# Patient Record
Sex: Male | Born: 1945 | Race: White | Hispanic: No | Marital: Married | Smoking: Former smoker
Health system: Southern US, Community
[De-identification: ages and names within clinical notes are randomized; demographics above are authoritative.]

## PROBLEM LIST (undated history)

## (undated) DIAGNOSIS — K5790 Diverticulosis of intestine, part unspecified, without perforation or abscess without bleeding: Secondary | ICD-10-CM

## (undated) DIAGNOSIS — K625 Hemorrhage of anus and rectum: Secondary | ICD-10-CM

## (undated) DIAGNOSIS — H269 Unspecified cataract: Secondary | ICD-10-CM

## (undated) DIAGNOSIS — I10 Essential (primary) hypertension: Secondary | ICD-10-CM

## (undated) DIAGNOSIS — K219 Gastro-esophageal reflux disease without esophagitis: Secondary | ICD-10-CM

## (undated) DIAGNOSIS — T7840XA Allergy, unspecified, initial encounter: Secondary | ICD-10-CM

## (undated) DIAGNOSIS — K635 Polyp of colon: Secondary | ICD-10-CM

## (undated) DIAGNOSIS — E119 Type 2 diabetes mellitus without complications: Secondary | ICD-10-CM

## (undated) DIAGNOSIS — I219 Acute myocardial infarction, unspecified: Secondary | ICD-10-CM

## (undated) DIAGNOSIS — M47816 Spondylosis without myelopathy or radiculopathy, lumbar region: Secondary | ICD-10-CM

## (undated) DIAGNOSIS — E039 Hypothyroidism, unspecified: Secondary | ICD-10-CM

## (undated) DIAGNOSIS — K55059 Acute (reversible) ischemia of intestine, part and extent unspecified: Secondary | ICD-10-CM

## (undated) DIAGNOSIS — M419 Scoliosis, unspecified: Secondary | ICD-10-CM

## (undated) HISTORY — DX: Scoliosis, unspecified: M41.9

## (undated) HISTORY — DX: Acute (reversible) ischemia of intestine, part and extent unspecified: K55.059

## (undated) HISTORY — DX: Type 2 diabetes mellitus without complications: E11.9

## (undated) HISTORY — DX: Gastro-esophageal reflux disease without esophagitis: K21.9

## (undated) HISTORY — DX: Hemorrhage of anus and rectum: K62.5

## (undated) HISTORY — PX: COLONOSCOPY: SHX174

## (undated) HISTORY — DX: Unspecified cataract: H26.9

## (undated) HISTORY — DX: Allergy, unspecified, initial encounter: T78.40XA

## (undated) HISTORY — DX: Hypothyroidism, unspecified: E03.9

## (undated) HISTORY — PX: OTHER SURGICAL HISTORY: SHX169

## (undated) HISTORY — DX: Diverticulosis of intestine, part unspecified, without perforation or abscess without bleeding: K57.90

## (undated) HISTORY — PX: CORONARY ANGIOPLASTY WITH STENT PLACEMENT: SHX49

## (undated) HISTORY — DX: Spondylosis without myelopathy or radiculopathy, lumbar region: M47.816

## (undated) HISTORY — PX: CATARACT EXTRACTION: SUR2

## (undated) HISTORY — DX: Polyp of colon: K63.5

## (undated) HISTORY — PX: POLYPECTOMY: SHX149

## (undated) HISTORY — DX: Essential (primary) hypertension: I10

## (undated) HISTORY — DX: Acute myocardial infarction, unspecified: I21.9

---
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
## 1992-09-08 HISTORY — PX: INGUINAL HERNIA REPAIR: SUR1180

## 1993-09-26 ENCOUNTER — Encounter (INDEPENDENT_AMBULATORY_CARE_PROVIDER_SITE_OTHER): Payer: Self-pay | Admitting: Gastroenterology

## 1998-03-27 ENCOUNTER — Other Ambulatory Visit: Admission: RE | Admit: 1998-03-27 | Discharge: 1998-03-27 | Payer: Self-pay | Admitting: Family Medicine

## 2002-09-08 DIAGNOSIS — I219 Acute myocardial infarction, unspecified: Secondary | ICD-10-CM

## 2002-09-08 HISTORY — DX: Acute myocardial infarction, unspecified: I21.9

## 2002-09-08 HISTORY — PX: CORONARY ARTERY BYPASS GRAFT: SHX141

## 2003-04-26 ENCOUNTER — Encounter: Payer: Self-pay | Admitting: Cardiology

## 2003-04-26 ENCOUNTER — Inpatient Hospital Stay (HOSPITAL_COMMUNITY): Admission: AD | Admit: 2003-04-26 | Discharge: 2003-04-29 | Payer: Self-pay | Admitting: Emergency Medicine

## 2003-04-26 ENCOUNTER — Encounter: Payer: Self-pay | Admitting: Emergency Medicine

## 2003-06-08 ENCOUNTER — Encounter: Payer: Self-pay | Admitting: Cardiothoracic Surgery

## 2003-06-12 ENCOUNTER — Encounter: Payer: Self-pay | Admitting: Cardiothoracic Surgery

## 2003-06-12 ENCOUNTER — Inpatient Hospital Stay (HOSPITAL_COMMUNITY): Admission: RE | Admit: 2003-06-12 | Discharge: 2003-06-17 | Payer: Self-pay | Admitting: Cardiothoracic Surgery

## 2003-06-13 ENCOUNTER — Encounter: Payer: Self-pay | Admitting: Cardiothoracic Surgery

## 2003-06-14 ENCOUNTER — Encounter: Payer: Self-pay | Admitting: Cardiothoracic Surgery

## 2003-06-15 ENCOUNTER — Encounter: Payer: Self-pay | Admitting: Cardiothoracic Surgery

## 2004-04-30 ENCOUNTER — Encounter (INDEPENDENT_AMBULATORY_CARE_PROVIDER_SITE_OTHER): Payer: Self-pay | Admitting: *Deleted

## 2005-08-18 ENCOUNTER — Encounter: Admission: RE | Admit: 2005-08-18 | Discharge: 2005-08-18 | Payer: Self-pay | Admitting: Cardiology

## 2005-11-19 ENCOUNTER — Ambulatory Visit: Payer: Self-pay | Admitting: Gastroenterology

## 2007-04-20 ENCOUNTER — Ambulatory Visit: Payer: Self-pay | Admitting: Gastroenterology

## 2007-12-07 ENCOUNTER — Ambulatory Visit: Payer: Self-pay | Admitting: Gastroenterology

## 2007-12-13 ENCOUNTER — Ambulatory Visit: Payer: Self-pay | Admitting: Gastroenterology

## 2007-12-22 ENCOUNTER — Ambulatory Visit: Payer: Self-pay | Admitting: Gastroenterology

## 2007-12-23 DIAGNOSIS — E039 Hypothyroidism, unspecified: Secondary | ICD-10-CM | POA: Insufficient documentation

## 2007-12-23 DIAGNOSIS — K649 Unspecified hemorrhoids: Secondary | ICD-10-CM | POA: Insufficient documentation

## 2007-12-23 DIAGNOSIS — I10 Essential (primary) hypertension: Secondary | ICD-10-CM | POA: Insufficient documentation

## 2007-12-23 DIAGNOSIS — Z87898 Personal history of other specified conditions: Secondary | ICD-10-CM | POA: Insufficient documentation

## 2007-12-23 DIAGNOSIS — I219 Acute myocardial infarction, unspecified: Secondary | ICD-10-CM | POA: Insufficient documentation

## 2007-12-23 DIAGNOSIS — K5730 Diverticulosis of large intestine without perforation or abscess without bleeding: Secondary | ICD-10-CM | POA: Insufficient documentation

## 2007-12-23 DIAGNOSIS — K219 Gastro-esophageal reflux disease without esophagitis: Secondary | ICD-10-CM | POA: Insufficient documentation

## 2007-12-23 DIAGNOSIS — D126 Benign neoplasm of colon, unspecified: Secondary | ICD-10-CM | POA: Insufficient documentation

## 2007-12-23 DIAGNOSIS — K625 Hemorrhage of anus and rectum: Secondary | ICD-10-CM | POA: Insufficient documentation

## 2008-01-10 ENCOUNTER — Telehealth (INDEPENDENT_AMBULATORY_CARE_PROVIDER_SITE_OTHER): Payer: Self-pay | Admitting: *Deleted

## 2008-07-12 ENCOUNTER — Encounter: Admission: RE | Admit: 2008-07-12 | Discharge: 2008-07-12 | Payer: Self-pay | Admitting: Neurosurgery

## 2009-01-02 ENCOUNTER — Emergency Department (HOSPITAL_COMMUNITY): Admission: EM | Admit: 2009-01-02 | Discharge: 2009-01-02 | Payer: Self-pay | Admitting: Emergency Medicine

## 2009-10-09 HISTORY — PX: OTHER SURGICAL HISTORY: SHX169

## 2010-07-17 ENCOUNTER — Ambulatory Visit (HOSPITAL_COMMUNITY): Admission: RE | Admit: 2010-07-17 | Discharge: 2010-07-17 | Payer: Self-pay | Admitting: Neurosurgery

## 2010-08-23 ENCOUNTER — Ambulatory Visit: Payer: Self-pay | Admitting: Cardiology

## 2010-11-04 ENCOUNTER — Telehealth: Payer: Self-pay | Admitting: Gastroenterology

## 2010-11-14 NOTE — Progress Notes (Signed)
Summary: ? re meds   Phone Note Call from Patient Call back at Home Phone [phone]   Caller: Patient Call For: Dr Jarold Motto Reason for Call: Talk to Nurse Summary of Call: Patient has questons regarding his nexium. Initial call taken by: Tawni Levy,  November 04, 2010 12:21 PM  Follow-up for Phone Call        rx sent for one month, pt sent to [facility] to make an office visit.  Follow-up by: Harlow Mares CMA Duncan Dull),  November 04, 2010 1:45 PM    Prescriptions: NEXIUM 40 MG  CPDR (ESOMEPRAZOLE MAGNESIUM) 1 capsule twice a day 30 minutes before meals  #60 Capsule x 0   Entered by:   Harlow Mares CMA (AAMA)   Authorized by:   Mardella Layman MD [facility]   Signed by:   Harlow Mares CMA (AAMA) on 11/04/2010   Method used:   Electronically to        [facility] [phone]* (retail)       [address]       [place], [place]  [address]       Ph: [phone] or [phone]       Fax: [phone]   RxID:   [id number]

## 2010-11-15 ENCOUNTER — Ambulatory Visit (INDEPENDENT_AMBULATORY_CARE_PROVIDER_SITE_OTHER): Payer: Medicare Other | Admitting: Gastroenterology

## 2010-11-15 ENCOUNTER — Encounter: Payer: Self-pay | Admitting: Gastroenterology

## 2010-11-15 DIAGNOSIS — K219 Gastro-esophageal reflux disease without esophagitis: Secondary | ICD-10-CM

## 2010-11-19 LAB — GLUCOSE, CAPILLARY
Glucose-Capillary: 113 mg/dL — ABNORMAL HIGH (ref 70–99)
Glucose-Capillary: 125 mg/dL — ABNORMAL HIGH (ref 70–99)

## 2010-11-19 NOTE — Assessment & Plan Note (Signed)
Summary: Follow up for Med Refills    History of Present Illness Visit Type: Follow-up Visit Primary GI MD: Sheryn Bison MD FACP FAGA Primary Provider: Jonny Ruiz Redding,MD Chief Complaint: Medication refill on Nexium, No GI complaints, Back Pain History of Present Illness:    this patient is status post recent spine surgery is completely asymptomatic. denies acid reflux symptoms and takes Nexium 40 mg a day , twice a day. his diabetes is managed by Dr.Kumar  and he is not on insulin. his hypertension is managed by Dr. Roger Shelter. the patient is up-to-date on his colonoscopy exams. Last endoscopy was 10 years ago but did not show Barrett's mucosa.   GI Review of Systems      Denies abdominal pain, acid reflux, belching, bloating, chest pain, dysphagia with liquids, dysphagia with solids, heartburn, loss of appetite, nausea, vomiting, vomiting blood, weight loss, and  weight gain.        Denies anal fissure, black tarry stools, change in bowel habit, constipation, diarrhea, diverticulosis, fecal incontinence, heme positive stool, hemorrhoids, irritable bowel syndrome, jaundice, light color stool, liver problems, rectal bleeding, and  rectal pain.    Current Medications (verified): 1)  Nexium 40 Mg  Cpdr (Esomeprazole Magnesium) .Marland Kitchen.. 1 Capsule Twice A Day 30 Minutes Before Meals 2)  Vytorin 10-20 Mg Tabs (Ezetimibe-Simvastatin) .Marland Kitchen.. 1 By Mouth At Bedtime 3)  Synthroid 150 Mcg Tabs (Levothyroxine Sodium) .Marland Kitchen.. 1 By Mouth Once Daily 4)  Hydrochlorothiazide 25 Mg Tabs (Hydrochlorothiazide) .Marland Kitchen.. 1 By Mouth Once Daily 5)  Altace 10 Mg Caps (Ramipril) .Marland Kitchen.. 1 By Mouth Once Daily 6)  Fexofenadine Hcl 180 Mg Tabs (Fexofenadine Hcl) .Marland Kitchen.. 1 By Mouth Once Daily 7)  Folic Acid 800 Mcg Tabs (Folic Acid) .Marland Kitchen.. 1 By Mouth Once Daily 8)  Aspirin 325 Mg Tabs (Aspirin) .Marland Kitchen.. 1 By Mouth Once Daily 9)  Multivitamins  Tabs (Multiple Vitamin) .Marland Kitchen.. 1 By Mouth Once Daily 10)  Metformin Hcl 500 Mg Tabs  (Metformin Hcl) .... 3 By Mouth With Evening Meal  Allergies (verified): No Known Drug Allergies  Past History:  Past medical, surgical, family and social histories (including risk factors) reviewed for relevance to current acute and chronic problems.  Past Medical History: Current Problems:  INGUINAL HERNIA, HX OF (ICD-V13.8) AMI (ICD-410.90) HYPERTENSION (ICD-401.9) HYPOTHYROIDISM (ICD-244.9) COLONIC POLYPS (ICD-211.3) HEMORRHOIDS (ICD-455.6) DIVERTICULOSIS, COLON (ICD-562.10) GERD (ICD-530.81) RECTAL BLEEDING (ICD-569.3) Diabetes 2009 Heart attack 2004 Lumbar arthritis Lumbar buldging Lumbar Scoliosis  Past Surgical History: appendectomy 1965 CABG x 5 Cardiac Stent placement  Hernia repair-inguinal Heart By- pass lumbar laminotomy and Foraminotomy   Family History: Reviewed history and no changes required.  Social History: Reviewed history and no changes required.  Review of Systems       The patient complains of back pain.  The patient denies allergy/sinus, anemia, anxiety-new, arthritis/joint pain, blood in urine, breast changes/lumps, change in vision, confusion, cough, coughing up blood, depression-new, fainting, fatigue, fever, headaches-new, hearing problems, heart murmur, heart rhythm changes, itching, menstrual pain, muscle pains/cramps, night sweats, nosebleeds, pregnancy symptoms, shortness of breath, skin rash, sleeping problems, sore throat, swelling of feet/legs, swollen lymph glands, thirst - excessive , urination - excessive , urination changes/pain, urine leakage, vision changes, and voice change.    Vital Signs:  Patient profile:   65 year old male Height:      69 inches Weight:      175 pounds BMI:     25.94 BSA:     1.95 Pulse rate:   80 / minute  Pulse rhythm:   regular BP sitting:   110 / 76  (left arm)  Vitals Entered By: Merri Ray CMA Duncan Dull) (November 15, 2010 11:09 AM)  Physical Exam  General:  Well developed, well nourished, no  acute distress.healthy appearing.   Head:  Normocephalic and atraumatic. Eyes:  PERRLA, no icterus.exam deferred to patient's ophthalmologist.   Lungs:  Clear throughout to auscultation. Heart:  Regular rate and rhythm; no murmurs, rubs,  or bruits. Abdomen:  Soft, nontender and nondistended. No masses, hepatosplenomegaly or hernias noted. Normal bowel sounds. Psych:  Alert and cooperative. Normal mood and affect.   Impression & Recommendations:  Problem # 1:  GERD (ICD-530.81) Assessment Improved  continue anti-reflux regime and daily Nexium therapy.  he does not need repeat endoscopy at this time.  Problem # 2:  COLONIC POLYPS (ICD-211.3) Assessment: Unchanged  followup as per clinical protocol.  Patient Instructions: 1)  Copy sent to : John Redding,MD 2)  Your prescription(s) have been sent to you pharmacy.  3)  The medication list was reviewed and reconciled.  All changed / newly prescribed medications were explained.  A complete medication list was provided to the patient / caregiver. 4)  Avoid foods high in acid content ( tomatoes, citrus juices, spicy foods) . Avoid eating within 3 to 4 hours of lying down or before exercising. Do not over eat; try smaller more frequent meals. Elevate head of bed four inches when sleeping.  5)  GI Reflux and Hiatal Hernia brochure given.  Prescriptions: NEXIUM 40 MG  CPDR (ESOMEPRAZOLE MAGNESIUM) 1 capsule twice a day 30 minutes before meals  #180 x 3   Entered by:   Harlow Mares CMA (AAMA)   Authorized by:   Mardella Layman MD [facility]   Signed by:   Harlow Mares CMA (AAMA) on 11/15/2010   Method used:   Electronically to        [facility]* (retail)             ,          Ph: [phone]       Fax: [phone]   RxID:   [id number]

## 2010-12-18 LAB — DIFFERENTIAL
Basophils Absolute: 0.1 10*3/uL (ref 0.0–0.1)
Basophils Relative: 1 % (ref 0–1)
Eosinophils Absolute: 0.5 10*3/uL (ref 0.0–0.7)
Eosinophils Relative: 6 % — ABNORMAL HIGH (ref 0–5)
Lymphocytes Relative: 27 % (ref 12–46)
Lymphs Abs: 2.2 10*3/uL (ref 0.7–4.0)
Monocytes Absolute: 0.8 10*3/uL (ref 0.1–1.0)
Monocytes Relative: 10 % (ref 3–12)
Neutro Abs: 4.4 10*3/uL (ref 1.7–7.7)
Neutrophils Relative %: 56 % (ref 43–77)

## 2010-12-18 LAB — COMPREHENSIVE METABOLIC PANEL
ALT: 25 U/L (ref 0–53)
AST: 17 U/L (ref 0–37)
Albumin: 3.9 g/dL (ref 3.5–5.2)
Alkaline Phosphatase: 55 U/L (ref 39–117)
BUN: 20 mg/dL (ref 6–23)
CO2: 27 mEq/L (ref 19–32)
Calcium: 9.2 mg/dL (ref 8.4–10.5)
Chloride: 99 mEq/L (ref 96–112)
Creatinine, Ser: 1.06 mg/dL (ref 0.4–1.5)
GFR calc Af Amer: >60 mL/min (ref >60)
GFR calc non Af Amer: >60 mL/min (ref >60)
Glucose, Bld: 103 mg/dL — ABNORMAL HIGH (ref 70–99)
Potassium: 4.2 mEq/L (ref 3.5–5.1)
Sodium: 134 mEq/L — ABNORMAL LOW (ref 135–145)
Total Bilirubin: 0.8 mg/dL (ref 0.3–1.2)
Total Protein: 6.6 g/dL (ref 6.0–8.3)

## 2010-12-18 LAB — POCT CARDIAC MARKERS
CKMB, poc: <1 ng/mL — ABNORMAL LOW (ref 1.0–8.0)
Myoglobin, poc: 49.3 ng/mL (ref 12–200)
Troponin i, poc: <0.05 ng/mL (ref 0.00–0.09)

## 2010-12-18 LAB — CBC
HCT: 41.8 % (ref 39.0–52.0)
Hemoglobin: 14.4 g/dL (ref 13.0–17.0)
MCHC: 34.3 g/dL (ref 30.0–36.0)
MCV: 91.8 fL (ref 78.0–100.0)
Platelets: 245 10*3/uL (ref 150–400)
RBC: 4.56 MIL/uL (ref 4.22–5.81)
RDW: 12.8 % (ref 11.5–15.5)
WBC: 7.9 10*3/uL (ref 4.0–10.5)

## 2011-01-09 ENCOUNTER — Other Ambulatory Visit: Payer: Self-pay | Admitting: *Deleted

## 2011-01-09 DIAGNOSIS — E7800 Pure hypercholesterolemia, unspecified: Secondary | ICD-10-CM

## 2011-01-09 MED ORDER — EZETIMIBE-SIMVASTATIN 10-20 MG PO TABS: 1.0000 | ORAL_TABLET | Freq: Every day | ORAL | Status: DC

## 2011-01-09 NOTE — Telephone Encounter (Signed)
Refill to [facility] per fax

## 2011-01-10 ENCOUNTER — Other Ambulatory Visit (INDEPENDENT_AMBULATORY_CARE_PROVIDER_SITE_OTHER): Payer: Medicare Other | Admitting: *Deleted

## 2011-01-10 DIAGNOSIS — E119 Type 2 diabetes mellitus without complications: Secondary | ICD-10-CM

## 2011-01-10 DIAGNOSIS — E7800 Pure hypercholesterolemia, unspecified: Secondary | ICD-10-CM

## 2011-01-10 LAB — BASIC METABOLIC PANEL
BUN: 15 mg/dL (ref 6–23)
CO2: 29 mEq/L (ref 19–32)
Calcium: 9.2 mg/dL (ref 8.4–10.5)
Chloride: 96 mEq/L (ref 96–112)
Creatinine, Ser: 1.2 mg/dL (ref 0.4–1.5)
GFR: 65.82 mL/min (ref >60.00)
Glucose, Bld: 97 mg/dL (ref 70–99)
Potassium: 4.4 mEq/L (ref 3.5–5.1)
Sodium: 134 mEq/L — ABNORMAL LOW (ref 135–145)

## 2011-01-10 LAB — LIPID PANEL
Cholesterol: 114 mg/dL (ref 0–200)
HDL: 40.8 mg/dL (ref >39.00)
LDL Cholesterol: 55 mg/dL (ref 0–99)
Total CHOL/HDL Ratio: 3
Triglycerides: 93 mg/dL (ref 0.0–149.0)
VLDL: 18.6 mg/dL (ref 0.0–40.0)

## 2011-01-10 LAB — HEPATIC FUNCTION PANEL
ALT: 19 U/L (ref 0–53)
AST: 18 U/L (ref 0–37)
Albumin: 4.2 g/dL (ref 3.5–5.2)
Alkaline Phosphatase: 56 U/L (ref 39–117)
Bilirubin, Direct: 0 mg/dL (ref 0.0–0.3)
Total Bilirubin: 0.3 mg/dL (ref 0.3–1.2)
Total Protein: 6.7 g/dL (ref 6.0–8.3)

## 2011-01-10 LAB — HEMOGLOBIN A1C: Hgb A1c MFr Bld: 6 % (ref 4.6–6.5)

## 2011-01-14 ENCOUNTER — Telehealth: Payer: Self-pay | Admitting: Cardiology

## 2011-01-14 DIAGNOSIS — E785 Hyperlipidemia, unspecified: Secondary | ICD-10-CM

## 2011-01-14 NOTE — Telephone Encounter (Signed)
Lab Result Review. No answer left pt message to call back regarding lab work. Needs to be informed lab recheck in 6 months.

## 2011-01-16 ENCOUNTER — Encounter: Payer: Self-pay | Admitting: Cardiology

## 2011-01-20 ENCOUNTER — Encounter: Payer: Self-pay | Admitting: Cardiology

## 2011-01-21 NOTE — Assessment & Plan Note (Signed)
[facility]                         GASTROENTEROLOGY OFFICE NOTE   NAME:[name] [name], [name] [name]                       MRN:          [id number]  DATE:12/07/2007                            DOB:          March 29, 1946    [name] [name] has had some asymptomatic rectal bleeding on two occasions.  He  denies abdominal pain or constipation.  He does have a history of colon  polyps on his last colonoscopy some 3-1/2 years ago.  He does have a  family history of colon cancer in a cousin.  He is very concerned about  this possibility.  He has chronic GERD which is doing well on daily  Nexium therapy.  He is followed by Dr. Delfin Edis from cardiology.  He  is on an aspirin tablet because of previous coronary artery bypass  surgery.  He denies any current cardiovascular or pulmonary complaints.  His primary care physician is Dr. Donia Guiles.   PHYSICAL EXAMINATION:  He is awake and alert in no acute distress.  Weight is 186 pounds.  Blood pressure 120/74.  Pulse 64 and regular.  ABDOMEN:  Entirely benign.  There are some prominent external  hemorrhoids, which are nonbleeding.  I could not appreciate a fissure or  fistula.  RECTAL:  No masses or tenderness with soft stools and guaiac negative.   ASSESSMENT:  [name] most likely has some hemorrhoidal bleeding.  He  does have a history of colon polyps and is very concerned about the  possibility of colon cancer.   RECOMMENDATIONS:  1. Sitz baths b.i.d. and Analpram cream.  2. Continue constipation regime.  3. Colonoscopy off salicylate therapy at his convenience.     Vania Rea. Jarold Motto, MD, Caleen Essex, FAGA  Electronically Signed    DRP/MedQ  DD: 12/07/2007  DT: 12/07/2007  Job #: [id number]   cc:   Colleen Can. Deborah Chalk, M.D.  Gwendlyn Deutscher II, M.D.

## 2011-01-24 ENCOUNTER — Ambulatory Visit (INDEPENDENT_AMBULATORY_CARE_PROVIDER_SITE_OTHER): Payer: Medicare Other | Admitting: Cardiology

## 2011-01-24 ENCOUNTER — Encounter: Payer: Self-pay | Admitting: Cardiology

## 2011-01-24 VITALS — BP 118/70 | HR 80 | Ht 69.0 in | Wt 175.5 lb

## 2011-01-24 DIAGNOSIS — I2510 Atherosclerotic heart disease of native coronary artery without angina pectoris: Secondary | ICD-10-CM

## 2011-01-24 DIAGNOSIS — E785 Hyperlipidemia, unspecified: Secondary | ICD-10-CM | POA: Insufficient documentation

## 2011-01-24 HISTORY — DX: Atherosclerotic heart disease of native coronary artery without angina pectoris: I25.10

## 2011-01-24 NOTE — Discharge Summary (Signed)
NAME:  [name] [name], [name] [name]                          ACCOUNT NO.:  [id number]   MEDICAL RECORD NO.:  [id number]                   PATIENT TYPE:  INP   LOCATION:  2040                                 FACILITY:  [facility]   PHYSICIAN:  Gwenith Daily. Tyrone Sage, M.D.            DATE OF BIRTH:  1946/03/11   DATE OF ADMISSION:  DATE OF DISCHARGE:  06/17/2003                                 DISCHARGE SUMMARY   PRIMARY ADMITTING DIAGNOSIS:  Coronary artery disease.   ADDITIONAL/DISCHARGE DIAGNOSES:  1. Coronary artery disease.  2. Status post recent myocardial infarction in 8/04.  3. Hyperlipidemia.  4. Hypertension.  5. Gastroesophageal reflux disease.  6. Postoperative anemia.   PROCEDURES PERFORMED:  1. Coronary artery bypass grafting x5 (left internal mammary artery     sequentially to the diagonal and the left anterior descending artery,     saphenous vein graft to the distal circumflex, left radial artery to the     first obtuse marginal, right internal mammary artery to the posterior     descending artery).  2. Endoscopic vein harvest, right thigh.   HISTORY:  The patient is a 65 year old white male with a history of  hypertension and hyperlipidemia, who was admitted to [facility] in  August 2004 with an acute myocardial infarction.  At that time, he underwent  cardiac catheterization and was found to have significant 3-vessel coronary  artery disease including occlusion of the right coronary artery at the right  proximal third of the vessel.  The patient underwent PTCA and stent  placement at that time, and was started on Plavix.  The patient was  discharged to home, and has remained stable.  He was referred to Dr.  Tyrone Sage for consideration for revascularization surgically, as he has  significant LAD, diagonal and circumflex disease which were not felt to be  amenable to angioplasty.   Dr. Tyrone Sage saw the patient in the office and agreed that he was a good  candidate, and  after discussion with the patient and his family, he agreed  to proceed.   HOSPITAL COURSE:  The patient was admitted on June 12, 2003 and was taken  to the operating room where he underwent CABG x5 as described in detail  above.  The patient tolerated the procedure well and was transferred to the  SICU in stable condition. He was extubated shortly after surgery. He was  hemodynamically stable and doing well on postoperative day #1.  He was  started on Imdur for his radial artery graft.  Also, he was restarted on  Plavix as well as started on beta-blocker therapy.  He was mobilized in the  unit.  By postoperative day #2, he was ready for transfer to the floor.   Postoperatively, he has been anemic.  Initially, his hemoglobin was 7.7 and  this remained stable for several days, however, at present it is 6.7 with  hematocrit of  19.  The patient has remained hemodynamically stable and  maintaining blood pressures in the 130-140's systolic range.  He has also  been in normal sinus rhythm.  He has been completely asymptomatic from his  anemia.  Both Dr. Tyrone Sage and Dr. Deborah Chalk have discussed at length with the  patient the possible treatments, including transfusion, but the patient  specifically requests that a transfusion not be performed.  He has been  counseled about this on several different occasions, and he continues to  refuse transfusion at this time.  Since he has remained stable from his  anemia and is not symptomatic, it is felt that an appropriate compromise  would be to treat him with iron replacement therapy, as well as Epogen  injection.  He is agreeable to this, and this will be performed today.   He is doing very well toady otherwise.  He has been ambulating in the halls  both independently and with cardiac rehab phase 1.  He has been weaned off  supplemental oxygen and is maintaining O2 saturations of greater than 90% on  room air.  The remainder of his labs have remained  stable, with his most  recent blood chemistries showing a sodium of 135, potassium 3.8, BUN 13,  creatinine 1.1.  His surgical incision sites are healing well.  He is  tolerating a regular diet and is having normal bowel and bladder function.   Today, both Dr. Tyrone Sage and Dr. Elease Hashimoto have seen the patient and feel that  he is stable and ready to go home, with careful outpatient follow up.   DISCHARGE MEDICATIONS:  1. Enteric-coated aspirin 325 mg daily.  2. Toprol  XL 25 mg daily.  3. Plavix 75 mg daily.  4. Imdur 30 mg daily.  5. Zocor 20 mg daily.  6. Folic acid 1 mg daily.  7. Nexium 40 mg daily.  8. Niferex 150 mg b.i.d.  9. Colace 100 mg b.i.d.  10.      Tylox, 1-2 p.o. q.4h. p.r.n. pain.   DISCHARGE INSTRUCTIONS:  He is to refrain from driving, heavy lifting or  strenuous activity. He may continue daily walking and use of his incentive  spirometer. He may shower daily and clean his incisions with soap and water.  He will continue a low fat, low salt diet.   DISCHARGE FOLLOW UP:  He will see the CVTS office nurse in 5 days for staple  removal.  Dr. Ronnald Nian office will also see him on Wednesday for repeat  CBC.  He was asked to schedule an appointment to see Dr. Deborah Chalk in 2  weeks and have chest x-ray at that visit. The patient will follow up with  Dr. Tyrone Sage on Thursday, November 11 at 1:40. He was asked to bring his  chest x-ray to this appointment for Dr. Tyrone Sage to review.  He will call  our office in the interim if he experiences any problems or has any  questions.       Coral Ceo, P.A.                        Gwenith Daily Tyrone Sage, M.D.    GC/MEDQ  D:  06/17/2003  T:  06/17/2003  Job:  [id number]   cc:   Colleen Can. Deborah Chalk, M.D.  Fax: [phone]   Donia Guiles, M.D.  [address] [place]  [place] [address]  Fax: [phone]

## 2011-01-24 NOTE — Op Note (Signed)
NAME:  [name] [name], [name] [name]                          ACCOUNT NO.:  [id number]   MEDICAL RECORD NO.:  [id number]                   PATIENT TYPE:  INP   LOCATION:  2305                                 FACILITY:  [facility]   PHYSICIAN:  Gwenith Daily. Tyrone Sage, M.D.            DATE OF BIRTH:  24-Jul-1946   DATE OF PROCEDURE:  06/13/2003  DATE OF DISCHARGE:                                 OPERATIVE REPORT   PREOPERATIVE DIAGNOSES:  Coronary occlusive disease with recent inferior  myocardial infarction and angioplasty with stent placement in the right  coronary artery.   POSTOPERATIVE DIAGNOSES:  Coronary occlusive disease with recent inferior  myocardial infarction and angioplasty with stent placement in the right  coronary artery.   OPERATION PERFORMED:  Coronary artery bypass grafting times five with four  arterial grafts, a left internal mammary sequentially to the diagonal and  left anterior descending, left radial artery to the first obtuse marginal.  Reversed saphenous vein graft to the distal circumflex.  Right internal  mammary to the posterior descending coronary artery with Endo vein  harvesting and placement of right femoral arterial line.   SURGEON:  Gwenith Daily. Tyrone Sage, M.D.   ASSISTANT:  Rowe Clack, P.A.-C.   ANESTHESIA:  General.   INDICATIONS FOR PROCEDURE:  The patient is a 65 year old male with no  previous history of diabetes or smoking who presented approximately five  weeks prior with acute inferior myocardial infarction with total occlusion  of the right coronary artery.  At that time Dr. Deborah Chalk urgently performed  angioplasty on the occluded right coronary artery proximally and placed a  stent.  At the time the patient was also found to have concomitant disease  in the LAD, diagonal, first obtuse marginal, distal circumflex, all of  greater than 80%.  In addition, he still had residual disease in the  posterior descending coronary artery.  Because of these findings,  complete  revascularization was recommended to the patient who agreed and signed  informed consent.   DESCRIPTION OF PROCEDURE:  With Swann-Ganz and arterial line monitors in  place, the patient underwent general endotracheal anesthesia.  A right  femoral arterial line was placed.  The left arm was also draped, had a  negative Allen's test.  The patient is right-handed.  Initially, incision  was made over the radial artery on the volar surface of the left arm  preserving the nerve and retracting the brachioradialis muscle laterally.  The artery was identified.  Using the Harmonic scalpel, the mammary artery  was dissected free.  With the artery occluded, there was still Doppler flow  in the palmar arch.  The artery was then excised, flushed with heparinized  saline.  The arm was then closed with loose running 3-0 Vicryl sutures and  skin staples in the skin edges.  A median sternotomy was performed.  The  left internal mammary artery was dissected down as a pedicle  graft.  The  distal artery was divided and hydrostatically dilated with heparinized  saline.  The mammary artery was of excellent quality.  In a similar fashion,  the right internal mammary artery was dissected down, was also good quality  vessel with excellent flow.  Using the [employer] Endo vein harvesting system,  a segment of vein was harvested from the right thigh. The pericardium was  opened.  Overall the patient appeared to have preserved left ventricular  function with slight scarring on the inferior surface of the heart.  The  patient was systemically heparinized.  The ascending aorta and the right  atrium were cannulated in the aortic root.  A bent cardioplegia needle was  introduced into the ascending aorta.  The patient was placed on  cardiopulmonary bypass at 2.4L per minute per meter squared.  Sites for  anastomosis were selected and dissected out of the epicardium.  An aortic  crossclamp was applied.  of cold  blood potassium cardioplegia was  administered with rapid diastolic arrest of the heart.  Myocardial septal  temperature was monitored throughout the crossclamp period.   Attention was turned first to the very distal circumflex which had a mid 70  to 80% lesion.  The vessel was relatively small, opened, admitted a 1.5 mm  probe proximally and a 1 mm probe distally.  Using running 7-0 Prolene,  distal anastomosis was performed.  Attention was then turned to the obtuse  marginal coronary artery, which was larger vessel.  Using a running 8-0  Prolene, the radial artery was anastomosed to the first obtuse marginal.  Attention was then turned to the posterior descending coronary artery. The  right internal mammary artery as pedicle graft reached to the posterior  descending which had a take off working from the right relatively high on  the inferior wall.  The vessel was of good quality.  Using running 8-0  Prolene, the right internal mammary artery as a pedicle graft was  anastomosed to the posterior descending coronary artery.  Attention was then  turned to the diagonal coronary artery which was as large or larger than the  LAD. The vessel was opened and admitted a 1.5 mm probe.  The mammary artery  was sequentially anastomosis to the diagonal coronary artery with a  longitudinal side-to-side anastomosis with running 8-0 Prolene.  The  proximal two thirds of the LAD was intramyocardial.  The distal third of the  vessel was opened and admitted a 1 mm probe distally and a 1.5 mm probe  proximally.  The distal end of the left internal mammary artery was then  anastomosed to the left anterior descending coronary artery with a running 8-  0 Prolene.   With release of the Edwards bulldog on the mammary artery, there was prompt  rise in myocardial septal temperature.  The bulldog was then removed also  from the right internal mammary artery with prompt blush in the inferior surface of the heart.   Aortic cross-clamp was removed with a total cross-  clamp time of 84 minutes.  Each anastomosis was inspected and free of  bleeding.  The fascial pedicles of the mammary arteries were tacked to the  epicardium.  The patient required electrical defibrillation to return to a  sinus rhythm.  A partial occlusion clamp was placed on the ascending aorta.  Two punch aortotomies were performed and the vein graft to the circumflex  was anastomosed to the proximal aorta.  The more distal punch aortotomy with  a running 7-0 Prolene.  The radial artery was anastomosed to the ascending  aorta.  Air was evacuated from the grafts and the partial occlusion clamp  was removed.  The patient was then ventilated and weaned from  cardiopulmonary bypass without difficulty.  He was decannulated in the usual  fashion.  Protamine sulfate was administered.  With the operative field  hemostatic, two atrial and two ventricular pacing wires were applied.  Graft  markers were applied.  A left pleural tube and a right pleural tube were  left in place.  Two mediastinal tubes were left in place.  The pericardium  was reapproximated over the ascending aorta and right ventricle.  The chest  was closed with #6 stainless steel wire.  Fascia closed with interrupted 0  Vicryl, running 3-0 Vicryl in the subcutaneous tissues and 4-0 subcuticular  stitch in the skin edges.  Dry dressings were applied.  Sponge and needle  counts were reported as correct at the completion of the procedure.  The  patient tolerated the procedure without obvious complication and was  transferred to the surgical intensive care unit for further postoperative  care.                                                 Gwenith Daily Tyrone Sage, M.D.    EBG/MEDQ  D:  06/13/2003  T:  06/13/2003  Job:  [id number]   cc:   Colleen Can. Deborah Chalk, M.D.  Fax: [phone]

## 2011-01-24 NOTE — H&P (Signed)
   NAME:  [name] [name], [name] [name]                          ACCOUNT NO.:  [id number]   MEDICAL RECORD NO.:  [id number]                   PATIENT TYPE:  INP   LOCATION:  1825                                 FACILITY:  [facility]   PHYSICIAN:  Colleen Can. Deborah Chalk, M.D.            DATE OF BIRTH:  Sep 02, 1946   DATE OF ADMISSION:  04/26/2003  DATE OF DISCHARGE:                                HISTORY & PHYSICAL   CHIEF COMPLAINT:  Chest pain.   HISTORY OF PRESENT ILLNESS:  [name] [name] is a 65 year old white male who has  had a past history of hypertension.  He presents with acute onset of chest  pain that awoke him at 6 o'clock this morning.  He was brought to the  emergency room.  He was noted to have inferior myocardial infarction in  progress.  He was subsequently referred for emergent cardiac  catheterization.   PAST MEDICAL HISTORY:  Hypertension for several years.  There are no reports  of diabetes, previous stroke.  Cholesterol levels are unknown.   ALLERGIES:  None.   MEDICATIONS:  1. Plendil.  2. Hydrochlorothiazide.   FAMILY HISTORY:  Father died of a heart attack in his 25s.   SOCIAL HISTORY:  There is no smoking.   REVIEW OF SYSTEMS:  Unable to be obtained due to the emergent nature of the  situation.   PHYSICAL EXAMINATION:  GENERAL:  The patient is a white male who is acutely  having chest pain.  He is currently nauseated with some shortness of breath.  VITAL SIGNS:  Blood pressure was 118/80, heart rate is 57, respirations are  18, O2 saturation was 97%.  He was afebrile with a temperature of 97.1.  SKIN:  Warm and dry.  Color was unremarkable.  LUNGS:  Basically clear.  HEART:  Regular rhythm.  ABDOMEN:  Soft, positive bowel sounds, nontender.  EXTREMITIES:  Extremities showed distal pulses to be intact.  NEUROLOGIC:  Intact with no gross focal deficits.   PERTINENT LABORATORY DATA:  CBC showed a white count of 11.6, hemoglobin was  14, hematocrit 41, platelets 294.  i-STAT  chemistry showed a BUN of 18,  creatinine was 1.7, glucose was 120, serum potassium was 4.1.   EKG showed sinus bradycardia with acute inferior changes.    OVERALL IMPRESSION:  Acute inferior myocardial infarction.   PLAN:  He is taken emergently to cardiac catheterization lab per Dr. Roger Shelter for further intervention.      Juanell Fairly C. Earl Gala, N.P.                 Colleen Can. Deborah Chalk, M.D.    LCO/MEDQ  D:  04/26/2003  T:  04/26/2003  Job:  [id number]   cc:   Donia Guiles, M.D.  [address] [place]  [place] [address]  Fax: [phone]

## 2011-01-24 NOTE — Discharge Summary (Signed)
NAME:  [name] [name], [name] [name]                          ACCOUNT NO.:  [id number]   MEDICAL RECORD NO.:  [id number]                   PATIENT TYPE:  INP   LOCATION:  2021                                 FACILITY:  [facility]   PHYSICIAN:  Colleen Can. Deborah Chalk, M.D.            DATE OF BIRTH:  1946-07-17   DATE OF ADMISSION:  04/26/2003  DATE OF DISCHARGE:  04/29/2003                                 DISCHARGE SUMMARY   PRIMARY DISCHARGE DIAGNOSIS:  Inferior myocardial infarction with subsequent  stent placement to the right coronary artery.   SECONDARY DISCHARGE DIAGNOSES:  1. 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  2. Hypertension.  3. Hyperlipidemia.   HISTORY OF PRESENT ILLNESS:  The patient is a 65 year old white male who has  known history of hypertension who presented to the emergency room with acute  onset of chest pain that awoke him at 6 a.m.  He was brought to the  emergency room and was noted to have inferior myocardial infarction in  progress and was subsequently referred for emergent cardiac catheterization.   Please see dictated History and Physical for the patient presentation and  profile.   LABORATORY DATA ON ADMISSION:  1. CBC showed a white count of 11.6, hemoglobin 14, hematocrit of 41,     platelets 294.  BUN 18, creatinine 1.7, glucose was 120, and potassium     was 4.1.  2. EKG showed sinus bradycardia with acute inferior changes.  3. Lipid panel showed total cholesterol 122, triglycerides 235, LDL was 40,     HDL was 35.  4. Peak cardiac MB was 57.3, peak troponin was 26.24.  5. Chest x-ray is pending at the time of final dictation.   HOSPITAL COURSE:  The patient was admitted emergently from the emergency  room and was referred on for cardiac catheterization.  The left ventricle  demonstrated normal to mild inferior hypokinesis.  The right coronary artery  was 100% occluded.  A 3.0 x 33 mm Cypher stent was subsequently  placed with  an overall satisfactory result obtained.  There was a 60-70% residual  narrowing at the crux.  The left main was normal.  The LAD was calcified  with irregularities.  There was an 80+% lesion at a large diagonal branch.  The bifurcating left circumflex demonstrated an 80-90% stenosis.   Post stenting of the right coronary artery the patient was transferred to  the coronary care unit for further evaluation.  Subsequently after the  procedure he had questionable visual disturbances primarily of the right  eye, with questionable third nerve palsy.  CT of the head was performed with  no acute abnormalities.  His Integrilin was subsequently discontinued.  By  the following day the visual disturbances had basically resolved.  From a  cardiac standpoint he progressed quite nicely.  He was transferred to 2000  for further monitoring.  His cardiac catheterization films were reviewed and  the patient was felt to need coronary artery bypass grafting within the next  six to eight weeks.   By April 28, 2003 the patient was doing well.  Blood pressure was low but  he was asymptomatic.  He had no further visual disturbances.  His laboratory  data were stable and he was felt to be a stable candidate for discharge on  April 29, 2003.   DISCHARGE CONDITION:  Stable.   DISCHARGE MEDICATIONS:  1. He will resume his Zocor, Nexium, Synthroid, and vitamins as he was     taking before.  2. Aspirin 325 mg daily.  3. Plavix 75 mg daily.  4. Altace 2.5 mg b.i.d.  5. Nitroglycerin p.r.n.   He is to have light activities.  He may walk five to ten minutes two times a  day but no driving, no heavy lifting, and no sexual intercourse.   He will follow up in the office in approximately 10 days.  At that time we  will go ahead and make referral for coronary artery bypass grafting to occur  towards the latter part of September/first part of October.  He was to call  if any problems arise in the  interim.       Juanell Fairly C. Earl Gala, N.P.                 Colleen Can. Deborah Chalk, M.D.    LCO/MEDQ  D:  05/23/2003  T:  05/23/2003  Job:  [id number]   cc:   Donia Guiles, M.D.  [address] [place]  [place] [address]  Fax: [phone]   Gwenith Daily. Tyrone Sage, M.D.  [address]  [place]  [place] [address]

## 2011-01-24 NOTE — Progress Notes (Signed)
Subjective:   [name] [name] is seen today for followup visit.  He had low back surgery at the laser spine clinic in [facility] and had an excellent result. He's not had any recurrent chest pain or cardiac symptoms. Overall, he is improving and doing well. He does have underlying diabetes mellitus. He also has a history of hypertension and hyperlipemia as well as hypothyroidism.  He had a remote heart attack in 2004 with subsequent bypass grafting in October of 2004. At that time, he had a left internal mammary graft to the LAD and diagonal, vein graft to the left circumflex, radial graft to the first obtuse marginal and right internal mammary artery to the posterior descending. His last nuclear stress test was in June of 2010 with an ejection fraction of 60% with no evidence of ischemia.  His diabetes is followed by Dr. Lucianne Muss. We will make arrangements for followup stress testing prior to my retirement.  Current Outpatient Prescriptions  Medication Sig Dispense Refill  . amLODipine (NORVASC) 5 MG tablet 1 tablet Daily.      Marland Kitchen aspirin 325 MG tablet Take 325 mg by mouth daily.        Marland Kitchen ezetimibe-simvastatin (VYTORIN) 10-20 MG per tablet Take 1 tablet by mouth at bedtime.  90 tablet  3  . fexofenadine (ALLEGRA) 180 MG tablet Take 180 mg by mouth daily.        . folic acid (FOLVITE) 1 MG tablet Take 1 mg by mouth daily.        . hydrochlorothiazide 25 MG tablet Take 25 mg by mouth daily.        . metFORMIN (GLUCOPHAGE-XR) 500 MG 24 hr tablet 1 tablet Three times a day.      . multivitamin (THERAGRAN) per tablet Take 1 tablet by mouth daily.        Marland Kitchen NEXIUM 40 MG capsule 1 capsule Daily.        Marland Kitchen NIASPAN 500 MG CR tablet 1 tablet Daily.        . ramipril (ALTACE) 10 MG tablet Take 10 mg by mouth daily.        Marland Kitchen SYNTHROID 150 MCG tablet 1 tablet Daily.        No Known Allergies  Patient Active Problem List  Diagnoses  . COLONIC POLYPS  . HYPOTHYROIDISM  . HYPERTENSION  . AMI  . HEMORRHOIDS  .  GERD  . DIVERTICULOSIS, COLON  . RECTAL BLEEDING  . INGUINAL HERNIA, HX OF    History  Smoking status  . Former Smoker -- 1.0 packs/day for 10 years  . Types: Cigarettes  . Quit date: 09/08/1973  Smokeless tobacco  . Never Used    History  Alcohol Use No    No family history on file.  Review of Systems:   The patient denies any heat or cold intolerance.  No weight gain or weight loss.  The patient denies headaches or blurry vision.  There is no cough or sputum production.  The patient denies dizziness.  There is no hematuria or hematochezia.  The patient denies any muscle aches or arthritis.  The patient denies any rash.  The patient denies frequent falling or instability.  There is no history of depression or anxiety.  All other systems were reviewed and are negative.   Physical Exam:   Weight is 175. Blood pressure is 118/70 sitting, heart rate is 80.The head is normocephalic and atraumatic.  Pupils are equally round and reactive to light.  Sclerae nonicteric.  Conjunctiva is clear.  Oropharynx is unremarkable.  There's adequate oral airway.  Neck is supple there are no masses.  Thyroid is not enlarged.  There is no lymphadenopathy.  Lungs are clear.  Chest is symmetric.  Heart shows a regular rate and rhythm.  S1 and S2 are normal.  There is no murmur click or gallop.  Abdomen is soft normal bowel sounds.  There is no organomegaly.  Genital and rectal deferred.  Extremities are without edema.  Peripheral pulses are adequate.  Neurologically intact.  Full range of motion.  The patient is not depressed.  Skin is warm and dry. Assessment / Plan:

## 2011-01-24 NOTE — Assessment & Plan Note (Signed)
His most recent lab work was satisfactory.  We'll continue Vytorin and Niaspan.

## 2011-01-24 NOTE — Assessment & Plan Note (Signed)
Overall, he's doing well and we'll arrange for him to have a stress Cardiolite study. I will have him see Dr. Excell Seltzer in 8 months and thereafter see him on an annual basis. He has lab work by Dr. Lucianne Muss as well as Dr. Jeanie Sewer.

## 2011-02-10 ENCOUNTER — Ambulatory Visit (HOSPITAL_COMMUNITY): Payer: Medicare Other | Attending: Cardiology | Admitting: Radiology

## 2011-02-10 VITALS — Ht 69.0 in | Wt 172.0 lb

## 2011-02-10 DIAGNOSIS — I25810 Atherosclerosis of coronary artery bypass graft(s) without angina pectoris: Secondary | ICD-10-CM

## 2011-02-10 DIAGNOSIS — I2510 Atherosclerotic heart disease of native coronary artery without angina pectoris: Secondary | ICD-10-CM

## 2011-02-10 MED ORDER — REGADENOSON 0.4 MG/5ML IV SOLN
0.4000 mg | Freq: Once | INTRAVENOUS | Status: AC
  Administered 2011-02-10: 0.4 mg via INTRAVENOUS

## 2011-02-10 MED ORDER — TECHNETIUM TC 99M TETROFOSMIN IV KIT
33.0000 | PACK | Freq: Once | INTRAVENOUS | Status: AC | PRN
  Administered 2011-02-10: 33 via INTRAVENOUS

## 2011-02-10 MED ORDER — TECHNETIUM TC 99M TETROFOSMIN IV KIT
11.0000 | PACK | Freq: Once | INTRAVENOUS | Status: AC | PRN
  Administered 2011-02-10: 11 via INTRAVENOUS

## 2011-02-10 NOTE — Progress Notes (Signed)
[facility] SITE 3 NUCLEAR MED [address] [place] [address] [phone]  Cardiology Nuclear Med Study  [name] [name] is a 65 y.o. male [id number] 05/10/1946   Nuclear Med Background Indication for Stress Test:  Evaluation for Ischemia, Graft Patency, Stent Patency and PTCA Patency History: '04 Angioplasty, 10/04CABG:x5, '04 Heart Catheterization: 3V DZ, 08/04 Myocardial Infarction: IWMI, 06/10 Myocardial Perfusion Study: EF 68% (-) ischemia and '04 Stents: RCA prior to CABG Cardiac Risk Factors: History of Smoking, Hypertension, Lipids and NIDDM  Symptoms:  none   Nuclear Pre-Procedure Caffeine/Decaff Intake:  None NPO After: 6:30am   Lungs:  clear IV 0.9% NS with Angio Cath:  22g  IV Site: R Wrist  IV Started by:  Irean Hong, RN  Chest Size (in):  42 Cup Size: n/a  Height: 5\' 9"  (1.753 m)  Weight:  172 lb (78.019 kg)  BMI:  Body mass index is 25.40 kg/(m^2). Tech Comments:  N/A    Nuclear Med Study 1 or 2 day study: 1 day  Stress Test Type:  Treadmill/Lexiscan  Reading MD: Kristeen Miss, MD  Order Authorizing Provider:  S.Tennant  Resting Radionuclide: Technetium 51m Tetrofosmin  Resting Radionuclide Dose: 11.0 mCi   Stress Radionuclide:  Technetium 34m Tetrofosmin  Stress Radionuclide Dose: 33.0 mCi           Stress Protocol Rest HR: 64 Stress HR: 112  Rest BP: 104/69 Stress BP: 123/76  Exercise Time (min): n/a METS: n/a   Predicted Max HR: 154 bpm % Max HR: 54.55 bpm Rate Pressure Product: 93235   Dose of Adenosine (mg):  n/a Dose of Lexiscan: 0.4 mg  Dose of Atropine (mg): n/a Dose of Dobutamine: n/a mcg/kg/min (at max HR)  Stress Test Technologist: Milana Na, EMT-P  Nuclear Technologist:  Domenic Polite, CNMT     Rest Procedure:  Myocardial perfusion imaging was performed at rest 45 minutes following the intravenous administration of Technetium 38m Tetrofosmin. Rest ECG: NSR  Stress Procedure:  The patient received IV  Lexiscan 0.4 mg over 15-seconds with concurrent low level exercise and then Technetium 80m Tetrofosmin was injected at 30-seconds while the patient continued walking one more minute.  There were no significant changes with Lexiscan.  Quantitative spect images were obtained after a 45-minute delay. Stress ECG: No significant change from baseline ECG  QPS Raw Data Images:  There is interference from nuclear activity from structures below the diaphragm.  This does not affect the ability to read the study. Stress Images:  Normal homogeneous uptake in all areas of the myocardium. Rest Images:  Normal homogeneous uptake in all areas of the myocardium. Subtraction (SDS):  No evidence of ischemia. Transient Ischemic Dilatation (Normal <1.22):  1.01 Lung/Heart Ratio (Normal <0.45):  0.29  Quantitative Gated Spect Images QGS EDV:  78 ml QGS ESV:  29 ml QGS cine images:  NL LV Function; NL Wall Motion QGS EF: 62%  Impression Exercise Capacity:  Lexiscan with no exercise. BP Response:  Normal blood pressure response. Clinical Symptoms:  No chest pain. ECG Impression:  No significant ST segment change suggestive of ischemia. Comparison with Prior Nuclear Study: No images to compare  Overall Impression:  Normal stress nuclear study.  No evidence of ischemia.  Normal LV function.    Elyn Aquas , MD, [facility]

## 2011-02-11 NOTE — Progress Notes (Signed)
Copy routed to Dr. Tennant.[name] [name] °  °

## 2011-02-12 ENCOUNTER — Telehealth: Payer: Self-pay | Admitting: *Deleted

## 2011-02-12 NOTE — Telephone Encounter (Signed)
Pt notified of nuclear results.   

## 2011-02-12 NOTE — Progress Notes (Signed)
Normal

## 2011-03-24 ENCOUNTER — Other Ambulatory Visit: Payer: Self-pay | Admitting: *Deleted

## 2011-03-24 MED ORDER — NIACIN ER (ANTIHYPERLIPIDEMIC) 500 MG PO TBCR: 500.0000 mg | EXTENDED_RELEASE_TABLET | Freq: Every day | ORAL | Status: DC

## 2011-03-24 NOTE — Telephone Encounter (Signed)
escribe medication per fax request  

## 2011-07-21 ENCOUNTER — Encounter: Payer: Self-pay | Admitting: Cardiovascular Disease

## 2011-09-26 ENCOUNTER — Encounter: Payer: Self-pay | Admitting: *Deleted

## 2011-10-02 ENCOUNTER — Ambulatory Visit (INDEPENDENT_AMBULATORY_CARE_PROVIDER_SITE_OTHER): Payer: Medicare Other | Admitting: Cardiovascular Disease

## 2011-10-02 ENCOUNTER — Encounter: Payer: Self-pay | Admitting: Cardiovascular Disease

## 2011-10-02 VITALS — BP 114/78 | HR 79 | Ht 68.0 in | Wt 168.1 lb

## 2011-10-02 DIAGNOSIS — I2510 Atherosclerotic heart disease of native coronary artery without angina pectoris: Secondary | ICD-10-CM

## 2011-10-02 DIAGNOSIS — I10 Essential (primary) hypertension: Secondary | ICD-10-CM

## 2011-10-02 DIAGNOSIS — E785 Hyperlipidemia, unspecified: Secondary | ICD-10-CM

## 2011-10-02 NOTE — Patient Instructions (Signed)
Your physician wants you to follow-up in: 1 YEAR.  You will receive a reminder letter in the mail two months in advance. If you don't receive a letter, please call our office to schedule the follow-up appointment.  Your physician recommends that you continue on your current medications as directed. Please refer to the Current Medication list given to you today.  

## 2011-10-08 NOTE — Assessment & Plan Note (Signed)
The patient has stable coronary artery disease. He is 8 years out from multivessel coronary bypass surgery. He has had a stress Myoview scan within the last 12 months and it was negative for ischemia. I recommended continuing on his medical program. He is on an aggressive program with excellent control of his modifiable risk factors.

## 2011-10-08 NOTE — Assessment & Plan Note (Signed)
Blood pressure is in the ideal range on a combination of ramipril and hydrochlorothiazide.

## 2011-10-08 NOTE — Progress Notes (Signed)
HPI:  This 66 year old gentleman presented for followup evaluation. The patient has previously been followed by Dr. Deborah Chalk. I will be assuming his cardiac care from here forward.  The patient presented in 2004 with a myocardial infarction. He underwent emergency PCI and then ultimately was treated with multivessel coronary bypass surgery. His most recent nuclear stress test was in June 2012 and demonstrated no significant ischemia.  The patient is doing well at present. He exercises 3 times a week with both swimming and walking at the [facility]. He has no exertional symptoms. He specifically denies chest pain or pressure. He denies edema, palpitations, lightheadedness, orthopnea, PND, or syncope. The patient has diabetes and is followed by Dr. Lucianne Muss. I have a copy of his most recent labs from November 2012 and knee show a normal creatinine of 1.1, cholesterol of 98, triglycerides 93, LDL 43, and HDL 42. His hemoglobin A1c was 5.5.  Outpatient Encounter Prescriptions as of 10/02/2011  Medication Sig Dispense Refill  . aspirin 325 MG tablet Take 325 mg by mouth daily.        Marland Kitchen ezetimibe-simvastatin (VYTORIN) 10-20 MG per tablet Take 1 tablet by mouth at bedtime.  90 tablet  3  . fexofenadine (ALLEGRA) 180 MG tablet Take 180 mg by mouth daily.        . folic acid (FOLVITE) 1 MG tablet Take 800 mcg by mouth daily.       . hydrochlorothiazide 25 MG tablet Take 25 mg by mouth daily.       . metFORMIN (GLUCOPHAGE-XR) 500 MG 24 hr tablet 3 tablets at bedtime.       . multivitamin (THERAGRAN) per tablet Take 1 tablet by mouth daily.        Marland Kitchen NEXIUM 40 MG capsule 1 capsule Daily.        . niacin (NIASPAN) 500 MG CR tablet Take 1 tablet (500 mg total) by mouth at bedtime.  90 tablet  3  . ramipril (ALTACE) 10 MG tablet Take 10 mg by mouth daily.        Marland Kitchen SYNTHROID 150 MCG tablet 1 tablet Daily.      Marland Kitchen DISCONTD: amLODipine (NORVASC) 5 MG tablet 1 tablet Daily.        No Known Allergies  Past Medical History    Diagnosis Date  . Inguinal hernia   . AMI (acute mesenteric ischemia)   . HTN (hypertension)   . Hypothyroidism   . Colon polyps   . Hemorrhoids   . GERD (gastroesophageal reflux disease)   . Diverticulosis   . Rectal bleeding   . Heart attack 2004  . Arthritis, lumbar spine   . Lumbar scoliosis     ROS: Negative except as per HPI  BP 114/78  Pulse 79  Ht 5\' 8"  (1.727 m)  Wt 76.259 kg (168 lb 1.9 oz)  BMI 25.56 kg/m2  PHYSICAL EXAM: Pt is alert and oriented, NAD HEENT: normal Neck: JVP - normal, carotids 2+= without bruits Lungs: CTA bilaterally CV: RRR without murmur or gallop Abd: soft, NT, Positive BS, no hepatomegaly Ext: no C/C/E, distal pulses intact and equal Skin: warm/dry no rash  EKG:  Normal sinus rhythm 79 beats per minute, within normal limits.  ASSESSMENT AND PLAN:

## 2011-10-08 NOTE — Assessment & Plan Note (Signed)
He is on multidrug therapy with lipids at goal (LDL less than 70).

## 2011-10-14 ENCOUNTER — Other Ambulatory Visit: Payer: Self-pay | Admitting: *Deleted

## 2011-10-14 MED ORDER — LEVOTHYROXINE SODIUM 150 MCG PO TABS: 150.0000 ug | ORAL_TABLET | Freq: Every day | ORAL | Status: DC

## 2011-12-23 ENCOUNTER — Telehealth: Payer: Self-pay | Admitting: Cardiovascular Disease

## 2011-12-23 MED ORDER — LEVOTHYROXINE SODIUM 150 MCG PO TABS: 150.0000 ug | ORAL_TABLET | Freq: Every day | ORAL | Status: DC

## 2011-12-23 NOTE — Telephone Encounter (Signed)
New msg Pt wants to talk to you about levothyroxine rx going to [facility].  Please call back to discuss

## 2011-12-23 NOTE — Telephone Encounter (Signed)
[facility] is the pt's new pharmacy. The pt needs a new Rx for levothyroxine sent to pharmacy.  I spoke with Dr Excell Seltzer and he will fill this medication for pt.

## 2012-02-03 ENCOUNTER — Other Ambulatory Visit: Payer: Self-pay | Admitting: *Deleted

## 2012-02-03 MED ORDER — AMLODIPINE BESYLATE 5 MG PO TABS: 5.0000 mg | ORAL_TABLET | Freq: Every day | ORAL | Status: DC

## 2012-02-26 ENCOUNTER — Other Ambulatory Visit: Payer: Self-pay | Admitting: Cardiovascular Disease

## 2012-02-26 MED ORDER — EZETIMIBE-SIMVASTATIN 10-20 MG PO TABS: 1.0000 | ORAL_TABLET | Freq: Every day | ORAL | Status: DC

## 2012-02-26 MED ORDER — RAMIPRIL 10 MG PO TABS: 10.0000 mg | ORAL_TABLET | Freq: Every day | ORAL | Status: DC

## 2012-02-26 MED ORDER — NIACIN ER (ANTIHYPERLIPIDEMIC) 500 MG PO TBCR: 500.0000 mg | EXTENDED_RELEASE_TABLET | Freq: Every day | ORAL | Status: DC

## 2012-03-01 ENCOUNTER — Other Ambulatory Visit: Payer: Self-pay | Admitting: *Deleted

## 2012-03-01 MED ORDER — HYDROCHLOROTHIAZIDE 25 MG PO TABS: 25.0000 mg | ORAL_TABLET | Freq: Every day | ORAL | Status: DC

## 2012-07-27 ENCOUNTER — Other Ambulatory Visit: Payer: Self-pay | Admitting: *Deleted

## 2012-07-27 MED ORDER — AMLODIPINE BESYLATE 5 MG PO TABS: 5.0000 mg | ORAL_TABLET | Freq: Every day | ORAL | Status: DC

## 2012-07-28 ENCOUNTER — Encounter: Payer: Self-pay | Admitting: Cardiovascular Disease

## 2012-10-01 ENCOUNTER — Encounter: Payer: Self-pay | Admitting: Cardiovascular Disease

## 2012-10-01 ENCOUNTER — Ambulatory Visit (INDEPENDENT_AMBULATORY_CARE_PROVIDER_SITE_OTHER): Payer: Medicare Other | Admitting: Cardiovascular Disease

## 2012-10-01 VITALS — BP 121/82 | HR 90 | Ht 68.5 in | Wt 161.4 lb

## 2012-10-01 DIAGNOSIS — I10 Essential (primary) hypertension: Secondary | ICD-10-CM

## 2012-10-01 NOTE — Patient Instructions (Addendum)
Your physician wants you to follow-up in: 1 year with Dr. Cooper.  You will receive a reminder letter in the mail two months in advance. If you don't receive a letter, please call our office to schedule the follow-up appointment.  

## 2012-10-01 NOTE — Progress Notes (Signed)
HPI:  67 year old gentleman presenting for followup evaluation. The patient has coronary artery disease, initially presenting in 2004 with a myocardial infarction treated initially with PCI and ultimately with multivessel CABG. Last nuclear scan was in 2012 demonstrating no ischemia. His risk factors of hyperlipidemia, hypertension, and type 2 diabetes have all been well-controlled. He brings in lab work from November 2013 demonstrating a hemoglobin A1c of 5.7, creatinine 1.1, sodium 134. His hydrochlorothiazide was reduced because of his mild hyponatremia.  The patient feels well. He is somewhat limited by low back problems. He otherwise has no specific complaints related to exertion. He denies chest pain, chest pressure, or dyspnea. He's had no palpitations or leg swelling. He has upcoming cataract surgery February 13. He complains of urinary frequency. Otherwise his review of systems is negative.  Outpatient Encounter Prescriptions as of 10/01/2012  Medication Sig Dispense Refill  . amLODipine (NORVASC) 5 MG tablet Take 1 tablet (5 mg total) by mouth daily.  90 tablet  1  . aspirin 325 MG tablet Take 325 mg by mouth daily.        Marland Kitchen ezetimibe-simvastatin (VYTORIN) 10-20 MG per tablet Take 1 tablet by mouth at bedtime.  90 tablet  1  . fexofenadine (ALLEGRA) 180 MG tablet Take 180 mg by mouth daily.        . folic acid (FOLVITE) 1 MG tablet Take 800 mcg by mouth daily.       . hydrochlorothiazide (HYDRODIURIL) 25 MG tablet Take 12.5 mg by mouth daily.      Marland Kitchen levothyroxine (SYNTHROID) 150 MCG tablet Take 1 tablet (150 mcg total) by mouth daily.  90 tablet  3  . metFORMIN (GLUCOPHAGE-XR) 500 MG 24 hr tablet 3 tablets at bedtime.       . multivitamin (THERAGRAN) per tablet Take 1 tablet by mouth daily.        Marland Kitchen NEXIUM 40 MG capsule 1 capsule Daily.        . niacin (NIASPAN) 500 MG CR tablet Take 1 tablet (500 mg total) by mouth at bedtime.  90 tablet  1  . ramipril (ALTACE) 10 MG tablet Take 1  tablet (10 mg total) by mouth daily.  90 tablet  1  . traMADol (ULTRAM) 50 MG tablet       . [DISCONTINUED] hydrochlorothiazide (HYDRODIURIL) 25 MG tablet Take 1 tablet (25 mg total) by mouth daily.  90 tablet  3    No Known Allergies  Past Medical History  Diagnosis Date  . Inguinal hernia   . AMI (acute mesenteric ischemia)   . HTN (hypertension)   . Hypothyroidism   . Colon polyps   . Hemorrhoids   . GERD (gastroesophageal reflux disease)   . Diverticulosis   . Rectal bleeding   . Heart attack 2004  . Arthritis, lumbar spine   . Lumbar scoliosis     ROS: Negative except as per HPI  BP 121/82  Pulse 90  Ht 5' 8.5" (1.74 m)  Wt 73.211 kg (161 lb 6.4 oz)  BMI 24.18 kg/m2  PHYSICAL EXAM: Pt is alert and oriented, NAD HEENT: normal Neck: JVP - normal, carotids 2+= without bruits Lungs: CTA bilaterally CV: RRR without murmur or gallop Abd: soft, NT, Positive BS, no hepatomegaly, the abdominal aorta is nonpalpable Ext: no C/C/E, distal pulses intact and equal Skin: warm/dry no rash  EKG:  Normal sinus rhythm 81 beats per minute, nonspecific ST and T wave abnormality, cannot rule out age-indeterminate inferior MI.  ASSESSMENT AND  PLAN: 1. Coronary artery disease, native vessel. The patient is status post CABG. I advised him that he could reduce his aspirin dose to 81 mg daily. Otherwise I recommended that he continue on his current medical program. He should followup in 12 months.  2. Hyperlipidemia. Patient is managed with Vytorin 10/20 mg daily. His lipids are been excellent in the past. He is followed by Dr. Lucianne Muss.  3. Hypertension, essential. Blood pressure in the ideal range on the combination of ramipril and hydrochlorothiazide.  Tonny Bollman 10/01/2012 9:35 AM

## 2012-10-29 ENCOUNTER — Other Ambulatory Visit: Payer: Self-pay | Admitting: *Deleted

## 2012-10-29 ENCOUNTER — Encounter: Payer: Self-pay | Admitting: Gastroenterology

## 2012-10-29 MED ORDER — ESOMEPRAZOLE MAGNESIUM 40 MG PO CPDR: 40.0000 mg | DELAYED_RELEASE_CAPSULE | Freq: Every day | ORAL | Status: DC

## 2012-11-01 ENCOUNTER — Other Ambulatory Visit: Payer: Self-pay | Admitting: *Deleted

## 2012-11-01 MED ORDER — EZETIMIBE-SIMVASTATIN 10-20 MG PO TABS: 1.0000 | ORAL_TABLET | Freq: Every day | ORAL | Status: DC

## 2012-11-01 MED ORDER — NIACIN ER (ANTIHYPERLIPIDEMIC) 500 MG PO TBCR: 500.0000 mg | EXTENDED_RELEASE_TABLET | Freq: Every day | ORAL | Status: DC

## 2012-11-02 ENCOUNTER — Encounter: Payer: Self-pay | Admitting: Gastroenterology

## 2012-11-04 ENCOUNTER — Other Ambulatory Visit: Payer: Self-pay | Admitting: *Deleted

## 2012-11-04 MED ORDER — RAMIPRIL 10 MG PO TABS: 10.0000 mg | ORAL_TABLET | Freq: Every day | ORAL | Status: DC

## 2012-11-09 ENCOUNTER — Other Ambulatory Visit: Payer: Self-pay | Admitting: *Deleted

## 2012-11-09 MED ORDER — LEVOTHYROXINE SODIUM 150 MCG PO TABS: 150.0000 ug | ORAL_TABLET | Freq: Every day | ORAL | Status: DC

## 2012-11-16 ENCOUNTER — Encounter: Payer: Self-pay | Admitting: Cardiology

## 2012-12-03 ENCOUNTER — Other Ambulatory Visit: Payer: Self-pay | Admitting: *Deleted

## 2012-12-03 MED ORDER — AMLODIPINE BESYLATE 5 MG PO TABS: 5.0000 mg | ORAL_TABLET | Freq: Every day | ORAL | Status: DC

## 2012-12-06 ENCOUNTER — Encounter: Payer: Self-pay | Admitting: Gastroenterology

## 2012-12-06 ENCOUNTER — Ambulatory Visit (AMBULATORY_SURGERY_CENTER): Payer: Medicare Other

## 2012-12-06 VITALS — Ht 68.0 in | Wt 163.8 lb

## 2012-12-06 DIAGNOSIS — Z1211 Encounter for screening for malignant neoplasm of colon: Secondary | ICD-10-CM

## 2012-12-06 DIAGNOSIS — Z800 Family history of malignant neoplasm of digestive organs: Secondary | ICD-10-CM

## 2012-12-06 DIAGNOSIS — Z86010 Personal history of colonic polyps: Secondary | ICD-10-CM

## 2012-12-06 MED ORDER — MOVIPREP 100 G PO SOLR: ORAL | Status: DC

## 2012-12-20 ENCOUNTER — Ambulatory Visit (AMBULATORY_SURGERY_CENTER): Payer: Medicare Other | Admitting: Gastroenterology

## 2012-12-20 ENCOUNTER — Other Ambulatory Visit: Payer: Self-pay | Admitting: Gastroenterology

## 2012-12-20 ENCOUNTER — Encounter: Payer: Self-pay | Admitting: Gastroenterology

## 2012-12-20 VITALS — BP 110/66 | HR 63 | Temp 96.4°F | Resp 13 | Ht 68.0 in | Wt 163.0 lb

## 2012-12-20 DIAGNOSIS — K5730 Diverticulosis of large intestine without perforation or abscess without bleeding: Secondary | ICD-10-CM

## 2012-12-20 DIAGNOSIS — Z800 Family history of malignant neoplasm of digestive organs: Secondary | ICD-10-CM

## 2012-12-20 DIAGNOSIS — Z1211 Encounter for screening for malignant neoplasm of colon: Secondary | ICD-10-CM

## 2012-12-20 LAB — GLUCOSE, CAPILLARY
Glucose-Capillary: 81 mg/dL (ref 70–99)
Glucose-Capillary: 160 mg/dL — ABNORMAL HIGH (ref 70–99)

## 2012-12-20 MED ORDER — DEXTROSE 5 % IV SOLN: INTRAVENOUS | Status: DC

## 2012-12-20 MED ORDER — SODIUM CHLORIDE 0.9 % IV SOLN: 500.0000 mL | INTRAVENOUS | Status: DC

## 2012-12-20 NOTE — Progress Notes (Addendum)
Patient did not have preoperative order for IV antibiotic SSI prophylaxis. (G8918)  Patient did not experience any of the following events: a burn prior to discharge; a fall within the facility; wrong site/side/patient/procedure/implant event; or a hospital transfer or hospital admission upon discharge from the facility. (G8907)  

## 2012-12-20 NOTE — Op Note (Signed)
[facility] [address] [place], [address]   COLONOSCOPY PROCEDURE REPORT  PATIENT: [name] [name], [name] [name]  MR#: [id number] BIRTHDATE: [birth year] , 67  yrs. old GENDER: Male ENDOSCOPIST: Mardella Layman, MD, [facility] REFERRED BY: PROCEDURE DATE:  12/20/2012 PROCEDURE:   Colonoscopy, surveillance ASA CLASS:   Class III INDICATIONS:Average risk patient for colon cancer. MEDICATIONS: propofol (Diprivan) 150mg  IV  DESCRIPTION OF PROCEDURE:   After the risks and benefits and of the procedure were explained, informed consent was obtained.  A digital rectal exam revealed no abnormalities of the rectum.    The LB [id number]  endoscope was introduced through the anus and advanced to the cecum, which was identified by both the appendix and ileocecal valve .  The quality of the prep was good, using MoviPrep .  The instrument was then slowly withdrawn as the colon was fully examined.     COLON FINDINGS: Mild diverticulosis was noted in the descending colon and sigmoid colon.   The colon was otherwise normal.  There was no diverticulosis, inflammation, polyps or cancers unless previously stated.     Retroflexed views revealed no abnormalities. The scope was then withdrawn from the patient and the procedure completed.  COMPLICATIONS: There were no complications. ENDOSCOPIC IMPRESSION: 1.   Mild diverticulosis was noted in the descending colon and sigmoid colon 2.   The colon was otherwise normal ..no piolyps or cancer noted.  RECOMMENDATIONS: 1.  Continue current medications 2.  Continue current colorectal screening recommendations for "routine risk" patients with a repeat colonoscopy in 10 years. 3.  High fiber diet   REPEAT EXAM:  XB:JYNW Jeanie Sewer, MD  _______________________________ eSigned:  Mardella Layman, MD, [facility] 12/20/2012 10:28 AM

## 2012-12-20 NOTE — Patient Instructions (Addendum)
YOU HAD AN ENDOSCOPIC PROCEDURE TODAY AT THE [facility]: Refer to the procedure report that was given to you for any specific questions about what was found during the examination.  If the procedure report does not answer your questions, please call your gastroenterologist to clarify.  If you requested that your care partner not be given the details of your procedure findings, then the procedure report has been included in a sealed envelope for you to review at your convenience later.  YOU SHOULD EXPECT: Some feelings of bloating in the abdomen. Passage of more gas than usual.  Walking can help get rid of the air that was put into your GI tract during the procedure and reduce the bloating. If you had a lower endoscopy (such as a colonoscopy or flexible sigmoidoscopy) you may notice spotting of blood in your stool or on the toilet paper. If you underwent a bowel prep for your procedure, then you may not have a normal bowel movement for a few days.  DIET: Your first meal following the procedure should be a light meal and then it is ok to progress to your normal diet.  A half-sandwich or bowl of soup is an example of a good first meal.  Heavy or fried foods are harder to digest and may make you feel nauseous or bloated.  Likewise meals heavy in dairy and vegetables can cause extra gas to form and this can also increase the bloating.  Drink plenty of fluids but you should avoid alcoholic beverages for 24 hours.  ACTIVITY: Your care partner should take you home directly after the procedure.  You should plan to take it easy, moving slowly for the rest of the day.  You can resume normal activity the day after the procedure however you should NOT DRIVE or use heavy machinery for 24 hours (because of the sedation medicines used during the test).    SYMPTOMS TO REPORT IMMEDIATELY: A gastroenterologist can be reached at any hour.  During normal business hours, 8:30 AM to 5:00 PM Monday through Friday,  call [phone].  After hours and on weekends, please call the GI answering service at [phone] who will take a message and have the physician on call contact you.   Following lower endoscopy (colonoscopy or flexible sigmoidoscopy):  Excessive amounts of blood in the stool  Significant tenderness or worsening of abdominal pains  Swelling of the abdomen that is new, acute  Fever of 100F or higher    FOLLOW UP: If any biopsies were taken you will be contacted by phone or by letter within the next 1-3 weeks.  Call your gastroenterologist if you have not heard about the biopsies in 3 weeks.  Our staff will call the home number listed on your records the next business day following your procedure to check on you and address any questions or concerns that you may have at that time regarding the information given to you following your procedure. This is a courtesy call and so if there is no answer at the home number and we have not heard from you through the emergency physician on call, we will assume that you have returned to your regular daily activities without incident.  SIGNATURES/CONFIDENTIALITY: You and/or your care partner have signed paperwork which will be entered into your electronic medical record.  These signatures attest to the fact that that the information above on your After Visit Summary has been reviewed and is understood.  Full responsibility of the confidentiality   of this discharge information lies with you and/or your care-partner.     

## 2012-12-21 ENCOUNTER — Telehealth: Payer: Self-pay | Admitting: *Deleted

## 2012-12-21 NOTE — Telephone Encounter (Signed)
  Follow up Call-  Call back number 12/20/2012  Post procedure Call Back phone  # [phone]  Permission to leave phone message Yes     Patient questions:  Do you have a fever, pain , or abdominal swelling? no Pain Score  0 *  Have you tolerated food without any problems? yes  Have you been able to return to your normal activities? yes  Do you have any questions about your discharge instructions: Diet   no Medications  no Follow up visit  no  Do you have questions or concerns about your Care? no  Actions: * If pain score is 4 or above: No action needed, pain <4.

## 2012-12-31 ENCOUNTER — Encounter: Payer: Self-pay | Admitting: Cardiology

## 2013-05-10 ENCOUNTER — Other Ambulatory Visit: Payer: Self-pay

## 2013-05-10 ENCOUNTER — Other Ambulatory Visit: Payer: Self-pay | Admitting: *Deleted

## 2013-05-10 MED ORDER — METFORMIN HCL ER 500 MG PO TB24: 1500.0000 mg | ORAL_TABLET | Freq: Every day | ORAL | Status: DC

## 2013-05-10 MED ORDER — AMLODIPINE BESYLATE 5 MG PO TABS: 5.0000 mg | ORAL_TABLET | Freq: Every day | ORAL | Status: DC

## 2013-05-13 ENCOUNTER — Other Ambulatory Visit: Payer: Self-pay

## 2013-05-13 NOTE — Telephone Encounter (Signed)
error 

## 2013-05-18 ENCOUNTER — Other Ambulatory Visit: Payer: Self-pay

## 2013-05-18 MED ORDER — HYDROCHLOROTHIAZIDE 25 MG PO TABS: 12.5000 mg | ORAL_TABLET | Freq: Every day | ORAL | Status: DC

## 2013-06-29 ENCOUNTER — Other Ambulatory Visit: Payer: Self-pay | Admitting: *Deleted

## 2013-06-29 ENCOUNTER — Telehealth: Payer: Self-pay | Admitting: *Deleted

## 2013-06-29 ENCOUNTER — Telehealth: Payer: Self-pay | Admitting: Endocrinology

## 2013-06-29 MED ORDER — GLIMEPIRIDE 1 MG PO TABS: ORAL_TABLET | ORAL | Status: DC

## 2013-06-29 NOTE — Telephone Encounter (Signed)
pts PCP put him on prednisone because of a back injury and his sugars are running high, fasting this morning was 95, after eating breakfast of sausage and eggs, it was 113, at lunch he had a ham sandwich on whole wheat it was 214 then. Wants to know what to do?  Patient says he will deal with the pain if you think he doesn't need to be on the prednisone.

## 2013-06-29 NOTE — Telephone Encounter (Signed)
Pt is aware, rx sent 

## 2013-06-29 NOTE — Telephone Encounter (Signed)
Will not have a problem with blood sugars up to 200. He can start taking Amaryl 1 mg in the mornings while taking prednisone If blood sugar is still high he can increase this to 2 or 3 mg

## 2013-07-26 ENCOUNTER — Other Ambulatory Visit: Payer: Self-pay | Admitting: *Deleted

## 2013-07-26 ENCOUNTER — Other Ambulatory Visit (INDEPENDENT_AMBULATORY_CARE_PROVIDER_SITE_OTHER): Payer: Medicare Other

## 2013-07-26 DIAGNOSIS — E119 Type 2 diabetes mellitus without complications: Secondary | ICD-10-CM

## 2013-07-26 DIAGNOSIS — E039 Hypothyroidism, unspecified: Secondary | ICD-10-CM

## 2013-07-26 LAB — MICROALBUMIN / CREATININE URINE RATIO
Creatinine,U: 21.7 mg/dL
Microalb Creat Ratio: 0.5 mg/g (ref 0.0–30.0)
Microalb, Ur: 0.1 mg/dL (ref 0.0–1.9)

## 2013-07-26 LAB — T4, FREE: Free T4: 1.34 ng/dL (ref 0.60–1.60)

## 2013-07-26 LAB — LIPID PANEL
Cholesterol: 96 mg/dL (ref 0–200)
HDL: 41.3 mg/dL (ref >39.00)
LDL Cholesterol: 43 mg/dL (ref 0–99)
Total CHOL/HDL Ratio: 2
Triglycerides: 58 mg/dL (ref 0.0–149.0)
VLDL: 11.6 mg/dL (ref 0.0–40.0)

## 2013-07-26 LAB — URINALYSIS
Bilirubin Urine: NEGATIVE
Ketones, ur: NEGATIVE
Leukocytes, UA: NEGATIVE
Nitrite: NEGATIVE
Specific Gravity, Urine: <=1.005 (ref 1.000–1.030)
Total Protein, Urine: NEGATIVE
Urine Glucose: NEGATIVE
Urobilinogen, UA: 0.2 (ref 0.0–1.0)
pH: 6 (ref 5.0–8.0)

## 2013-07-26 LAB — COMPREHENSIVE METABOLIC PANEL
ALT: 21 U/L (ref 0–53)
AST: 18 U/L (ref 0–37)
Albumin: 4.2 g/dL (ref 3.5–5.2)
Alkaline Phosphatase: 58 U/L (ref 39–117)
BUN: 21 mg/dL (ref 6–23)
CO2: 26 mEq/L (ref 19–32)
Calcium: 9.4 mg/dL (ref 8.4–10.5)
Chloride: 105 mEq/L (ref 96–112)
Creatinine, Ser: 1.1 mg/dL (ref 0.4–1.5)
GFR: 68.66 mL/min (ref >60.00)
Glucose, Bld: 100 mg/dL — ABNORMAL HIGH (ref 70–99)
Potassium: 4.3 mEq/L (ref 3.5–5.1)
Sodium: 138 mEq/L (ref 135–145)
Total Bilirubin: 0.4 mg/dL (ref 0.3–1.2)
Total Protein: 7.1 g/dL (ref 6.0–8.3)

## 2013-07-26 LAB — TSH: TSH: 0.75 u[IU]/mL (ref 0.35–5.50)

## 2013-07-26 LAB — HEMOGLOBIN A1C: Hgb A1c MFr Bld: 6 % (ref 4.6–6.5)

## 2013-07-28 ENCOUNTER — Ambulatory Visit (INDEPENDENT_AMBULATORY_CARE_PROVIDER_SITE_OTHER): Payer: Medicare Other | Admitting: Endocrinology

## 2013-07-28 ENCOUNTER — Encounter: Payer: Self-pay | Admitting: Endocrinology

## 2013-07-28 VITALS — BP 120/78 | HR 81 | Temp 98.3°F | Resp 12 | Ht 68.0 in | Wt 163.7 lb

## 2013-07-28 DIAGNOSIS — E119 Type 2 diabetes mellitus without complications: Secondary | ICD-10-CM | POA: Insufficient documentation

## 2013-07-28 DIAGNOSIS — E039 Hypothyroidism, unspecified: Secondary | ICD-10-CM

## 2013-07-28 DIAGNOSIS — E785 Hyperlipidemia, unspecified: Secondary | ICD-10-CM

## 2013-07-28 DIAGNOSIS — I10 Essential (primary) hypertension: Secondary | ICD-10-CM

## 2013-07-28 NOTE — Progress Notes (Signed)
Reason for Appointment: Diabetes follow-up   History of Present Illness   Diagnosis: Type 2 DIABETES MELITUS, date of diagnosis: 2009     Previous history: He has been treated with metformin monotherapy for several years now with stable control He has generally been able to keep his weight down and watch his diet Unable to exercise much because of chronic back pain  Recent history:    He was seen about 6 months ago and his blood sugars recently appear to be excellent with upper normal A1c He is checking his blood sugars at various times and occasionally more than once a day with only minimal fluctuation Oral hypoglycemic drugs:   metformin       Side effects from medications: None       Monitors blood glucose: Once a day.    Glucometer:  FreeStyle        Blood Glucose readings from meter download: readings before breakfast: 80-136, nonfasting 92-214 with only occasional high readings after lunch and overall average 119  Hypoglycemia frequency:  none         Meals: 3 meals per day.          Physical activity: minimal, some walking           Dietician visit: Most recent: 11/2008   Weight control: Wt Readings from Last 3 Encounters:  07/28/13 163 lb 11.2 oz (74.254 kg)  12/20/12 163 lb (73.936 kg)  12/06/12 163 lb 12.8 oz (74.299 kg)        Eye exam has been done annually Complications: None, has no symptoms of neuropathy  Diabetes labs:  Lab Results  Component Value Date   HGBA1C 6.0 07/26/2013   HGBA1C 6.0 01/10/2011   Lab Results  Component Value Date   MICROALBUR 0.1 07/26/2013   LDLCALC 43 07/26/2013   CREATININE 1.1 07/26/2013   PROBLEM #2: Hypertension:  this is mild and well controlled. Because of hyponatremia he was asked to reduce his HCTZ to 12.5 mg but is currently not taking any and blood pressure is still well controlled     Medication List       This list is accurate as of: 07/28/13 11:59 PM.  Always use your most recent med list.               amLODipine 5 MG tablet  Commonly known as:  NORVASC  Take 1 tablet (5 mg total) by mouth daily.     aspirin 81 MG tablet  Take 81 mg by mouth daily.     aspirin 325 MG tablet  Take 325 mg by mouth daily.     esomeprazole 40 MG capsule  Commonly known as:  NEXIUM  Take 1 capsule (40 mg total) by mouth daily before breakfast.     ezetimibe-simvastatin 10-20 MG per tablet  Commonly known as:  VYTORIN  Take 1 tablet by mouth at bedtime.     fexofenadine 180 MG tablet  Commonly known as:  ALLEGRA  Take 180 mg by mouth daily.     folic acid 1 MG tablet  Commonly known as:  FOLVITE  Take 800 mcg by mouth daily.     levothyroxine 150 MCG tablet  Commonly known as:  SYNTHROID  Take 1 tablet (150 mcg total) by mouth daily.     metFORMIN 500 MG 24 hr tablet  Commonly known as:  GLUCOPHAGE-XR  Take 3 tablets (1,500 mg total) by mouth at bedtime.     multivitamin per tablet  Take 1 tablet by mouth daily.     niacin 500 MG CR tablet  Commonly known as:  NIASPAN  Take 1 tablet (500 mg total) by mouth at bedtime.     ramipril 10 MG tablet  Commonly known as:  ALTACE  Take 1 tablet (10 mg total) by mouth daily.     traMADol 50 MG tablet  Commonly known as:  ULTRAM        Allergies: No Known Allergies  Past Medical History  Diagnosis Date  . Inguinal hernia   . AMI (acute mesenteric ischemia)   . HTN (hypertension)   . Hypothyroidism   . Colon polyps   . Hemorrhoids   . GERD (gastroesophageal reflux disease)   . Diverticulosis   . Rectal bleeding   . Heart attack 2004  . Arthritis, lumbar spine   . Lumbar scoliosis   . Diabetes mellitus without complication   . Allergy   . Cataract     Past Surgical History  Procedure Laterality Date  . Appendectomy  1966  . Coronary artery bypass graft  2004    x 5 / one stent  . Coronary angioplasty with stent placement    . Inguinal hernia repair  1994    right side  . By-pass    . Lumbar laminotomy  10/2009  .  Colonoscopy    . Polypectomy    . Cataract extraction      Bil  /   11/2012    Family History  Problem Relation Age of Onset  . Heart disease Father   . Colon cancer Cousin     Social History:  reports that he quit smoking about 39 years ago. His smoking use included Cigarettes. He has a 10 pack-year smoking history. He has never used smokeless tobacco. He reports that he does not drink alcohol or use illicit drugs.  Review of Systems:  Lipids: LDL at target, HDL low normal  HYPOTHYROIDISM: This is long-standing and TSH is excellent with 150 mcg daily    LABS:  Appointment on 07/26/2013  Component Date Value Range Status  . Free T4 07/26/2013 1.34  0.60 - 1.60 ng/dL Final  . TSH 16/06/9603 0.75  0.35 - 5.50 uIU/mL Final  . Cholesterol 07/26/2013 96  0 - 200 mg/dL Final   ATP III Classification       Desirable:  < 200 mg/dL               Borderline High:  200 - 239 mg/dL          High:  > = 540 mg/dL  . Triglycerides 07/26/2013 58.0  0.0 - 149.0 mg/dL Final   Normal:  <981 mg/dLBorderline High:  150 - 199 mg/dL  . HDL 07/26/2013 41.30  >39.00 mg/dL Final  . VLDL 19/14/7829 11.6  0.0 - 40.0 mg/dL Final  . LDL Cholesterol 07/26/2013 43  0 - 99 mg/dL Final  . Total CHOL/HDL Ratio 07/26/2013 2   Final                  Men          Women1/2 Average Risk     3.4          3.3Average Risk          5.0          4.42X Average Risk          9.6          7.13X  Average Risk          15.0          11.0                      . Hemoglobin A1C 07/26/2013 6.0  4.6 - 6.5 % Final   Glycemic Control Guidelines for People with Diabetes:Non Diabetic:  <6%Goal of Therapy: <7%Additional Action Suggested:  >8%   . Sodium 07/26/2013 138  135 - 145 mEq/L Final  . Potassium 07/26/2013 4.3  3.5 - 5.1 mEq/L Final  . Chloride 07/26/2013 105  96 - 112 mEq/L Final  . CO2 07/26/2013 26  19 - 32 mEq/L Final  . Glucose, Bld 07/26/2013 100* 70 - 99 mg/dL Final  . BUN 40/98/1191 21  6 - 23 mg/dL Final  .  Creatinine, Ser 07/26/2013 1.1  0.4 - 1.5 mg/dL Final  . Total Bilirubin 07/26/2013 0.4  0.3 - 1.2 mg/dL Final  . Alkaline Phosphatase 07/26/2013 58  39 - 117 U/L Final  . AST 07/26/2013 18  0 - 37 U/L Final  . ALT 07/26/2013 21  0 - 53 U/L Final  . Total Protein 07/26/2013 7.1  6.0 - 8.3 g/dL Final  . Albumin 47/82/9562 4.2  3.5 - 5.2 g/dL Final  . Calcium 13/04/6577 9.4  8.4 - 10.5 mg/dL Final  . GFR 46/96/2952 68.66  >60.00 mL/min Final  . Color, Urine 07/26/2013 LT. YELLOW  Yellow;Lt. Yellow Final  . APPearance 07/26/2013 CLEAR  Clear Final  . Specific Gravity, Urine 07/26/2013 <=1.005  1.000 - 1.030 Final  . pH 07/26/2013 6.0  5.0 - 8.0 Final  . Total Protein, Urine 07/26/2013 NEGATIVE  Negative Final  . Urine Glucose 07/26/2013 NEGATIVE  Negative Final  . Ketones, ur 07/26/2013 NEGATIVE  Negative Final  . Bilirubin Urine 07/26/2013 NEGATIVE  Negative Final  . Hgb urine dipstick 07/26/2013 TRACE-LYSED  Negative Final  . Urobilinogen, UA 07/26/2013 0.2  0.0 - 1.0 Final  . Leukocytes, UA 07/26/2013 NEGATIVE  Negative Final  . Nitrite 07/26/2013 NEGATIVE  Negative Final  . Microalb, Ur 07/26/2013 0.1  0.0 - 1.9 mg/dL Final  . Creatinine,U 84/13/2440 21.7   Final  . Microalb Creat Ratio 07/26/2013 0.5  0.0 - 30.0 mg/g Final     Examination:   BP 120/78  Pulse 81  Temp(Src) 98.3 F (36.8 C)  Resp 12  Ht 5\' 8"  (1.727 m)  Wt 163 lb 11.2 oz (74.254 kg)  BMI 24.90 kg/m2  SpO2 95%  Body mass index is 24.9 kg/(m^2).   No ankle edema Foot exam done, see separate section  ASSESSMENT/ PLAN::   1. Diabetes type 2  Blood glucose control is in excellent with nearly normal blood sugars at home including postprandials with metformin alone He is quite compliant with his diet and eating quite healthy meals He has limited ability to exercise but is able to keep his weight down Since A1c is still excellent at 6% will have him continue His regimen of 1500 mg metformin Discussed  preventive measures such as eye exams, foot exams, foot care and exercise  No evidence of diabetic complications, normal urine microalbumin ratio today  HYPERLIPIDEMIA: Currently being treated with niacin and Vytorin because of history of CAD  Long-standing hypothyroidism: TSH is normal with his regimen of 150 mcg. He is compliant in the morning daily  with this daily and reminded him to take it without food   Hyponatremia secondary to HCTZ, resolved, not  on HCTZ 9 blood pressure is still well controlled  The total visit time including counseling = 25 minutes  Hailly Fess 07/29/2013, 9:40 AM

## 2013-09-15 ENCOUNTER — Ambulatory Visit: Payer: Medicare Other | Admitting: Cardiovascular Disease

## 2013-09-19 ENCOUNTER — Ambulatory Visit: Payer: Medicare Other | Admitting: Cardiovascular Disease

## 2013-09-21 ENCOUNTER — Ambulatory Visit (INDEPENDENT_AMBULATORY_CARE_PROVIDER_SITE_OTHER): Payer: Medicare Other | Admitting: Cardiovascular Disease

## 2013-09-21 ENCOUNTER — Encounter: Payer: Self-pay | Admitting: Cardiovascular Disease

## 2013-09-21 VITALS — BP 118/72 | HR 81 | Ht 68.0 in | Wt 160.2 lb

## 2013-09-21 DIAGNOSIS — E785 Hyperlipidemia, unspecified: Secondary | ICD-10-CM

## 2013-09-21 DIAGNOSIS — I10 Essential (primary) hypertension: Secondary | ICD-10-CM

## 2013-09-21 DIAGNOSIS — I2510 Atherosclerotic heart disease of native coronary artery without angina pectoris: Secondary | ICD-10-CM

## 2013-09-21 NOTE — Progress Notes (Signed)
    HPI:   68 year old gentleman presenting for followup evaluation. The patient has coronary artery disease, initially presenting in 2004 with a myocardial infarction treated initially with PCI and ultimately with multivessel CABG. Last nuclear scan was in 2012 demonstrating no ischemia. His risk factors of hyperlipidemia, hypertension, and type 2 diabetes have all been well-controlled. Most recent labs from November 2014 showed cholesterol of 96, triglycerides 58, HDL 41, and LDL 43.  He remains primarily limited by low back problems. He has been to see a neurosurgeon but there were no surgical options for him. He has no chest pain, shortness of breath, edema, or palpitations. He's been compliant with his medical program.   Outpatient Encounter Prescriptions as of 09/21/2013  Medication Sig  . amLODipine (NORVASC) 5 MG tablet Take 1 tablet (5 mg total) by mouth daily.  Marland Kitchen aspirin 81 MG tablet Take 81 mg by mouth daily.  Marland Kitchen esomeprazole (NEXIUM) 40 MG capsule Take 1 capsule (40 mg total) by mouth daily before breakfast.  . ezetimibe-simvastatin (VYTORIN) 10-20 MG per tablet Take 1 tablet by mouth at bedtime.  . fexofenadine (ALLEGRA) 180 MG tablet Take 180 mg by mouth daily.    . folic acid (FOLVITE) 1 MG tablet Take 800 mcg by mouth daily.   Marland Kitchen levothyroxine (SYNTHROID) 150 MCG tablet Take 1 tablet (150 mcg total) by mouth daily.  . metFORMIN (GLUCOPHAGE-XR) 500 MG 24 hr tablet Take 3 tablets (1,500 mg total) by mouth at bedtime.  . multivitamin (THERAGRAN) per tablet Take 1 tablet by mouth daily.    . niacin (NIASPAN) 500 MG CR tablet Take 1 tablet (500 mg total) by mouth at bedtime.  . ramipril (ALTACE) 10 MG tablet Take 1 tablet (10 mg total) by mouth daily.  . traMADol (ULTRAM) 50 MG tablet   . [DISCONTINUED] aspirin 325 MG tablet Take 325 mg by mouth daily.      No Known Allergies  Past Medical History  Diagnosis Date  . Inguinal hernia   . AMI (acute mesenteric ischemia)   . HTN  (hypertension)   . Hypothyroidism   . Colon polyps   . Hemorrhoids   . GERD (gastroesophageal reflux disease)   . Diverticulosis   . Rectal bleeding   . Heart attack 2004  . Arthritis, lumbar spine   . Lumbar scoliosis   . Diabetes mellitus without complication   . Allergy   . Cataract     ROS: Negative except as per HPI  BP 118/72  Pulse 81  Ht 5\' 8"  (1.727 m)  Wt 155 lb (70.308 kg)  BMI 23.57 kg/m2  PHYSICAL EXAM: Pt is alert and oriented, NAD HEENT: normal Neck: JVP - normal, carotids 2+= without bruits Lungs: CTA bilaterally CV: RRR without murmur or gallop Abd: soft, NT, Positive BS, no hepatomegaly Ext: no C/C/E, distal pulses intact and equal Skin: warm/dry no rash  EKG:  Sinus rhythm with PACs, possible age-indeterminate inferior MI, otherwise within normal limits.  ASSESSMENT AND PLAN: 1. Coronary artery disease, native vessel. The patient remains free of any significant angina. His blood pressure and lipids are ideal. His medical program was reviewed and no changes were made.  2. Hyperlipidemia. Lipids reviewed as above. He is on aggressive lipid-lowering with a combination of Zetia, simvastatin, and niacin. Managed by Dr. Dwyane Dee.  3. Hypertension. Blood pressure remains in the ideal range on his current medical program.  Sherren Mocha 09/21/2013 10:39 AM

## 2013-09-21 NOTE — Patient Instructions (Signed)
Your physician recommends that you continue on your current medications as directed. Please refer to the Current Medication list given to you today. Your physician wants you to follow-up in: April 2016.  You will receive a reminder letter in the mail two months in advance. If you don't receive a letter, please call our office to schedule the follow-up appointment.  

## 2013-11-02 ENCOUNTER — Other Ambulatory Visit: Payer: Self-pay

## 2013-11-02 MED ORDER — RAMIPRIL 10 MG PO CAPS: 10.0000 mg | ORAL_CAPSULE | Freq: Every day | ORAL | Status: DC

## 2013-11-10 ENCOUNTER — Other Ambulatory Visit: Payer: Self-pay

## 2013-11-10 MED ORDER — EZETIMIBE-SIMVASTATIN 10-20 MG PO TABS: 1.0000 | ORAL_TABLET | Freq: Every day | ORAL | Status: DC

## 2013-11-10 MED ORDER — LEVOTHYROXINE SODIUM 150 MCG PO TABS: 150.0000 ug | ORAL_TABLET | Freq: Every day | ORAL | Status: DC

## 2013-11-10 MED ORDER — NIACIN ER (ANTIHYPERLIPIDEMIC) 500 MG PO TBCR: 500.0000 mg | EXTENDED_RELEASE_TABLET | Freq: Every day | ORAL | Status: DC

## 2014-01-06 ENCOUNTER — Ambulatory Visit (INDEPENDENT_AMBULATORY_CARE_PROVIDER_SITE_OTHER): Payer: Medicare Other | Admitting: Endocrinology

## 2014-01-06 ENCOUNTER — Encounter: Payer: Self-pay | Admitting: Endocrinology

## 2014-01-06 ENCOUNTER — Telehealth: Payer: Self-pay | Admitting: *Deleted

## 2014-01-06 VITALS — BP 112/77 | HR 79 | Temp 97.6°F | Ht 68.0 in | Wt 161.0 lb

## 2014-01-06 DIAGNOSIS — E119 Type 2 diabetes mellitus without complications: Secondary | ICD-10-CM

## 2014-01-06 DIAGNOSIS — E039 Hypothyroidism, unspecified: Secondary | ICD-10-CM

## 2014-01-06 DIAGNOSIS — E785 Hyperlipidemia, unspecified: Secondary | ICD-10-CM

## 2014-01-06 LAB — T4, FREE: Free T4: 1.53 ng/dL (ref 0.60–1.60)

## 2014-01-06 LAB — LIPID PANEL
Cholesterol: 93 mg/dL (ref 0–200)
HDL: 37.2 mg/dL — ABNORMAL LOW (ref >39.00)
LDL Cholesterol: 33 mg/dL (ref 0–99)
Total CHOL/HDL Ratio: 3
Triglycerides: 115 mg/dL (ref 0.0–149.0)
VLDL: 23 mg/dL (ref 0.0–40.0)

## 2014-01-06 LAB — COMPREHENSIVE METABOLIC PANEL
ALT: 19 U/L (ref 0–53)
AST: 17 U/L (ref 0–37)
Albumin: 4.2 g/dL (ref 3.5–5.2)
Alkaline Phosphatase: 54 U/L (ref 39–117)
BUN: 16 mg/dL (ref 6–23)
CO2: 24 mEq/L (ref 19–32)
Calcium: 9.1 mg/dL (ref 8.4–10.5)
Chloride: 105 mEq/L (ref 96–112)
Creatinine, Ser: 1 mg/dL (ref 0.4–1.5)
GFR: 77.17 mL/min (ref >60.00)
Glucose, Bld: 93 mg/dL (ref 70–99)
Potassium: 4.3 mEq/L (ref 3.5–5.1)
Sodium: 136 mEq/L (ref 135–145)
Total Bilirubin: 0.4 mg/dL (ref 0.3–1.2)
Total Protein: 7.1 g/dL (ref 6.0–8.3)

## 2014-01-06 LAB — TSH: TSH: 0.12 u[IU]/mL — ABNORMAL LOW (ref 0.35–5.50)

## 2014-01-06 LAB — HEMOGLOBIN A1C: Hgb A1c MFr Bld: 5.7 % (ref 4.6–6.5)

## 2014-01-06 MED ORDER — LEVOTHYROXINE SODIUM 137 MCG PO TABS: 137.0000 ug | ORAL_TABLET | Freq: Every day | ORAL | Status: DC

## 2014-01-06 NOTE — Progress Notes (Signed)
[name] [name] 68 y.o.   Reason for Appointment: Diabetes follow-up   History of Present Illness   Diagnosis: Type 2 DIABETES MELITUS, date of diagnosis: 2009     Previous history: He has been treated with metformin monotherapy for several years now with stable control He has generally been able to keep his weight down and watch his diet Unable to exercise much because of chronic back pain  Recent history:    He was seen about 6 months ago and his blood sugars at home are fairly normal except for one reading Previously has had upper normal A1c He is checking his blood sugars at various times including after meals  with only minimal fluctuation Oral hypoglycemic drugs:   metformin       Side effects from medications: None       Monitors blood glucose: Once a day.    Glucometer:  FreeStyle        Blood Glucose readings from meter download:200  PREMEAL Breakfast Lunch  7 PM   Overall  Glucose range:  92-109   87-130   90-200     Mean/median:  99   112   118   109    Hypoglycemia frequency:  none         Meals: 3 meals per day.          Physical activity: minimal, some swimmimg           Dietician visit: Most recent: 11/2008   Weight control: Wt Readings from Last 3 Encounters:  01/06/14 161 lb (73.029 kg)  09/21/13 160 lb 3.2 oz (72.666 kg)  07/28/13 163 lb 11.2 oz (74.254 kg)        Eye exam has been done annually Complications: None, has no symptoms of neuropathy  Diabetes labs:  Lab Results  Component Value Date   HGBA1C 6.0 07/26/2013   HGBA1C 6.0 01/10/2011   Lab Results  Component Value Date   MICROALBUR 0.1 07/26/2013   LDLCALC 43 07/26/2013   CREATININE 1.1 07/26/2013   PROBLEM #2: Hypertension:  this is mild and well controlled. Because of hyponatremia he was asked to reduce his HCTZ to 12.5 mg but is currently not taking any and blood pressure is still well controlled     Medication List       This list is accurate as of: 01/06/14  8:17 AM.  Always use  your most recent med list.               amLODipine 5 MG tablet  Commonly known as:  NORVASC  Take 1 tablet (5 mg total) by mouth daily.     aspirin 81 MG tablet  Take 81 mg by mouth daily.     celecoxib 200 MG capsule  Commonly known as:  CELEBREX     cyclobenzaprine 10 MG tablet  Commonly known as:  FLEXERIL     esomeprazole 40 MG capsule  Commonly known as:  NEXIUM  Take 1 capsule (40 mg total) by mouth daily before breakfast.     ezetimibe-simvastatin 10-20 MG per tablet  Commonly known as:  VYTORIN  Take 1 tablet by mouth at bedtime.     fexofenadine 180 MG tablet  Commonly known as:  ALLEGRA  Take 180 mg by mouth daily.     folic acid 1 MG tablet  Commonly known as:  FOLVITE  Take 800 mcg by mouth daily.     levothyroxine 150 MCG tablet  Commonly known as:  SYNTHROID  Take 1 tablet (150 mcg total) by mouth daily.     metFORMIN 500 MG 24 hr tablet  Commonly known as:  GLUCOPHAGE-XR  Take 3 tablets (1,500 mg total) by mouth at bedtime.     multivitamin per tablet  Take 1 tablet by mouth daily.     niacin 500 MG CR tablet  Commonly known as:  NIASPAN  Take 1 tablet (500 mg total) by mouth at bedtime.     ramipril 10 MG capsule  Commonly known as:  ALTACE  Take 1 capsule (10 mg total) by mouth daily.     traMADol 50 MG tablet  Commonly known as:  ULTRAM        Allergies: No Known Allergies  Past Medical History  Diagnosis Date  . Inguinal hernia   . AMI (acute mesenteric ischemia)   . HTN (hypertension)   . Hypothyroidism   . Colon polyps   . Hemorrhoids   . GERD (gastroesophageal reflux disease)   . Diverticulosis   . Rectal bleeding   . Heart attack 2004  . Arthritis, lumbar spine   . Lumbar scoliosis   . Diabetes mellitus without complication   . Allergy   . Cataract     Past Surgical History  Procedure Laterality Date  . Appendectomy  1966  . Coronary artery bypass graft  2004    x 5 / one stent  . Coronary angioplasty with  stent placement    . Inguinal hernia repair  1994    right side  . By-pass    . Lumbar laminotomy  10/2009  . Colonoscopy    . Polypectomy    . Cataract extraction      Bil  /   11/2012    Family History  Problem Relation Age of Onset  . Heart disease Father   . Colon cancer Cousin     Social History:  reports that he quit smoking about 40 years ago. His smoking use included Cigarettes. He has a 10 pack-year smoking history. He has never used smokeless tobacco. He reports that he does not drink alcohol or use illicit drugs.  Review of Systems:  Lipids: LDL at target, HDL low normal  HYPOTHYROIDISM: This is long-standing and TSH has been normal with 150 mcg daily  He is compliant in the morning daily  with this daily   Lab Results  Component Value Date   FREET4 1.53 01/06/2014   FREET4 1.34 07/26/2013   TSH 0.12* 01/06/2014   TSH 0.75 07/26/2013   Foot exam done in 11/14   Examination:   BP 112/77  Pulse 79  Temp(Src) 97.6 F (36.4 C) (Oral)  Ht 5\' 8"  (1.727 m)  Wt 161 lb (73.029 kg)  BMI 24.49 kg/m2  SpO2 94%  Body mass index is 24.49 kg/(m^2).   No ankle edema  ASSESSMENT/ PLAN:   1. Diabetes type 2  Blood glucose control is in excellent with nearly normal blood sugars at home including postprandials with metformin alone He is quite compliant with his diet and maintaining his weight He has limited ability to exercise  A1c to be checked today  No history of diabetic complications and has had normal urine microalbumin ratio  HYPERLIPIDEMIA: Currently being treated with niacin and Vytorin because of history of CAD  Hypothyroidism: There have followup TSH today  Elayne Snare 01/06/2014, 8:17 AM   Addendum: Labs as follows TSH is low, would reduce his Synthroid 137 and have him followup with PCP  in 2 months  Office Visit on 01/06/2014  Component Date Value Ref Range Status  . Sodium 01/06/2014 136  135 - 145 mEq/L Final  . Potassium 01/06/2014 4.3  3.5 - 5.1  mEq/L Final  . Chloride 01/06/2014 105  96 - 112 mEq/L Final  . CO2 01/06/2014 24  19 - 32 mEq/L Final  . Glucose, Bld 01/06/2014 93  70 - 99 mg/dL Final  . BUN 01/06/2014 16  6 - 23 mg/dL Final  . Creatinine, Ser 01/06/2014 1.0  0.4 - 1.5 mg/dL Final  . Total Bilirubin 01/06/2014 0.4  0.3 - 1.2 mg/dL Final  . Alkaline Phosphatase 01/06/2014 54  39 - 117 U/L Final  . AST 01/06/2014 17  0 - 37 U/L Final  . ALT 01/06/2014 19  0 - 53 U/L Final  . Total Protein 01/06/2014 7.1  6.0 - 8.3 g/dL Final  . Albumin 01/06/2014 4.2  3.5 - 5.2 g/dL Final  . Calcium 01/06/2014 9.1  8.4 - 10.5 mg/dL Final  . GFR 01/06/2014 77.17  >60.00 mL/min Final  . Cholesterol 01/06/2014 93  0 - 200 mg/dL Final   ATP III Classification       Desirable:  < 200 mg/dL               Borderline High:  200 - 239 mg/dL          High:  > = 240 mg/dL  . Triglycerides 01/06/2014 115.0  0.0 - 149.0 mg/dL Final   Normal:  <150 mg/dLBorderline High:  150 - 199 mg/dL  . HDL 01/06/2014 37.20* >39.00 mg/dL Final  . VLDL 01/06/2014 23.0  0.0 - 40.0 mg/dL Final  . LDL Cholesterol 01/06/2014 33  0 - 99 mg/dL Final  . Total CHOL/HDL Ratio 01/06/2014 3   Final                  Men          Women1/2 Average Risk     3.4          3.3Average Risk          5.0          4.42X Average Risk          9.6          7.13X Average Risk          15.0          11.0                      . TSH 01/06/2014 0.12* 0.35 - 5.50 uIU/mL Final  . Free T4 01/06/2014 1.53  0.60 - 1.60 ng/dL Final  . Hemoglobin A1C 01/06/2014 5.7  4.6 - 6.5 % Final   Glycemic Control Guidelines for People with Diabetes:Non Diabetic:  <6%Goal of Therapy: <7%Additional Action Suggested:  >8%

## 2014-01-06 NOTE — Telephone Encounter (Signed)
Message copied by Harl Bowie on Fri Jan 06, 2014  2:49 PM ------      Message from: Elayne Snare      Created: Fri Jan 06, 2014  1:29 PM       Thyroid level is too high, need to reduce his Synthroid 137 and have him followup in 2 months unless his primary care doctor can do this, please send new prescription       ------

## 2014-01-06 NOTE — Progress Notes (Signed)
Quick Note:  Thyroid level is too high, need to reduce his Synthroid 137 and have him followup in 2 months unless his primary care doctor can do this, please send new prescription  ______

## 2014-01-06 NOTE — Telephone Encounter (Signed)
Spoke with the pt and informed him of recent lab results and note.  Pt understood and agreed.  New rx sent to the pharmacy([facility]) by e-script.  Pt scheduled an appt for 2 mos follow-up.//AB/CMA

## 2014-01-06 NOTE — Addendum Note (Signed)
Addended by: Harl Bowie on: 01/06/2014 02:49 PM   Modules accepted: Orders

## 2014-02-06 ENCOUNTER — Telehealth: Payer: Self-pay | Admitting: Endocrinology

## 2014-02-06 ENCOUNTER — Other Ambulatory Visit: Payer: Self-pay | Admitting: *Deleted

## 2014-02-06 MED ORDER — AMLODIPINE BESYLATE 5 MG PO TABS: 5.0000 mg | ORAL_TABLET | Freq: Every day | ORAL | Status: DC

## 2014-02-06 NOTE — Telephone Encounter (Signed)
Pt only has 10 more days of levothyroxin and his lab appt is not until July 1

## 2014-02-06 NOTE — Telephone Encounter (Signed)
Never mind this message pt did not notice he had a refill

## 2014-03-08 ENCOUNTER — Other Ambulatory Visit (INDEPENDENT_AMBULATORY_CARE_PROVIDER_SITE_OTHER): Payer: Medicare Other

## 2014-03-08 ENCOUNTER — Other Ambulatory Visit: Payer: Self-pay | Admitting: *Deleted

## 2014-03-08 DIAGNOSIS — E119 Type 2 diabetes mellitus without complications: Secondary | ICD-10-CM

## 2014-03-08 DIAGNOSIS — E039 Hypothyroidism, unspecified: Secondary | ICD-10-CM

## 2014-03-08 LAB — COMPREHENSIVE METABOLIC PANEL
ALT: 22 U/L (ref 0–53)
AST: 20 U/L (ref 0–37)
Albumin: 4.4 g/dL (ref 3.5–5.2)
Alkaline Phosphatase: 53 U/L (ref 39–117)
BUN: 15 mg/dL (ref 6–23)
CO2: 29 mEq/L (ref 19–32)
Calcium: 9.4 mg/dL (ref 8.4–10.5)
Chloride: 104 mEq/L (ref 96–112)
Creatinine, Ser: 1.1 mg/dL (ref 0.4–1.5)
GFR: 69.24 mL/min (ref >60.00)
Glucose, Bld: 103 mg/dL — ABNORMAL HIGH (ref 70–99)
Potassium: 4.5 mEq/L (ref 3.5–5.1)
Sodium: 139 mEq/L (ref 135–145)
Total Bilirubin: 0.5 mg/dL (ref 0.2–1.2)
Total Protein: 7.5 g/dL (ref 6.0–8.3)

## 2014-03-08 LAB — TSH: TSH: 1.02 u[IU]/mL (ref 0.35–4.50)

## 2014-03-08 LAB — LIPID PANEL
Cholesterol: 100 mg/dL (ref 0–200)
HDL: 49.9 mg/dL (ref >39.00)
LDL Cholesterol: 35 mg/dL (ref 0–99)
NonHDL: 50.1
Total CHOL/HDL Ratio: 2
Triglycerides: 78 mg/dL (ref 0.0–149.0)
VLDL: 15.6 mg/dL (ref 0.0–40.0)

## 2014-03-08 LAB — T4, FREE: Free T4: 1.19 ng/dL (ref 0.60–1.60)

## 2014-03-08 LAB — HEMOGLOBIN A1C: Hgb A1c MFr Bld: 5.9 % (ref 4.6–6.5)

## 2014-03-14 ENCOUNTER — Telehealth: Payer: Self-pay | Admitting: Endocrinology

## 2014-03-14 NOTE — Telephone Encounter (Signed)
Patent would like to talk to you Suanne Marker) concerning his lab results.

## 2014-03-15 ENCOUNTER — Other Ambulatory Visit: Payer: Self-pay | Admitting: *Deleted

## 2014-03-15 MED ORDER — LEVOTHYROXINE SODIUM 137 MCG PO TABS: 137.0000 ug | ORAL_TABLET | Freq: Every day | ORAL | Status: DC

## 2014-03-15 NOTE — Telephone Encounter (Signed)
Last rx sent was for 137.

## 2014-03-15 NOTE — Telephone Encounter (Signed)
Patient stated that he need to talk to someone concerning his labs results, he has two levothyroxine left, and want to know what to do. Please advise.  Thank you

## 2014-03-15 NOTE — Telephone Encounter (Signed)
Patient wants lab results please, okay to send in new rx?

## 2014-03-15 NOTE — Telephone Encounter (Signed)
He said he had labs drawn on 7/1 and was requesting the results from that. He also said that he's been on the 137 mcg for 2 months and with his most recent labs wanted to know if the dose was to stay the same or change?

## 2014-03-15 NOTE — Telephone Encounter (Signed)
His labs look okay, continue same dose

## 2014-03-15 NOTE — Telephone Encounter (Signed)
He was supposed to have been switched to 137 mcg as thyroid level was slightly high, will need followup in about 2 months with repeat thyroid test in office visit, he can do this with PCP if he prefers

## 2014-05-10 ENCOUNTER — Encounter: Payer: Self-pay | Admitting: Endocrinology

## 2014-05-10 ENCOUNTER — Other Ambulatory Visit: Payer: Self-pay | Admitting: *Deleted

## 2014-05-10 MED ORDER — METFORMIN HCL ER 500 MG PO TB24: 1500.0000 mg | ORAL_TABLET | Freq: Every day | ORAL | Status: DC

## 2014-05-17 ENCOUNTER — Other Ambulatory Visit (INDEPENDENT_AMBULATORY_CARE_PROVIDER_SITE_OTHER): Payer: Medicare Other

## 2014-05-17 ENCOUNTER — Other Ambulatory Visit: Payer: Self-pay | Admitting: *Deleted

## 2014-05-17 DIAGNOSIS — E119 Type 2 diabetes mellitus without complications: Secondary | ICD-10-CM

## 2014-05-17 DIAGNOSIS — E039 Hypothyroidism, unspecified: Secondary | ICD-10-CM

## 2014-05-17 LAB — LIPID PANEL
Cholesterol: 93 mg/dL (ref 0–200)
HDL: 44.8 mg/dL (ref >39.00)
LDL Cholesterol: 32 mg/dL (ref 0–99)
NonHDL: 48.2
Total CHOL/HDL Ratio: 2
Triglycerides: 79 mg/dL (ref 0.0–149.0)
VLDL: 15.8 mg/dL (ref 0.0–40.0)

## 2014-05-17 LAB — TSH: TSH: 0.12 u[IU]/mL — ABNORMAL LOW (ref 0.35–4.50)

## 2014-05-17 LAB — COMPREHENSIVE METABOLIC PANEL
ALT: 21 U/L (ref 0–53)
AST: 22 U/L (ref 0–37)
Albumin: 4.1 g/dL (ref 3.5–5.2)
Alkaline Phosphatase: 53 U/L (ref 39–117)
BUN: 17 mg/dL (ref 6–23)
CO2: 27 mEq/L (ref 19–32)
Calcium: 9.2 mg/dL (ref 8.4–10.5)
Chloride: 103 mEq/L (ref 96–112)
Creatinine, Ser: 1.1 mg/dL (ref 0.4–1.5)
GFR: 69.2 mL/min (ref >60.00)
Glucose, Bld: 94 mg/dL (ref 70–99)
Potassium: 4.1 mEq/L (ref 3.5–5.1)
Sodium: 136 mEq/L (ref 135–145)
Total Bilirubin: 0.4 mg/dL (ref 0.2–1.2)
Total Protein: 7 g/dL (ref 6.0–8.3)

## 2014-05-17 LAB — HEMOGLOBIN A1C: Hgb A1c MFr Bld: 6 % (ref 4.6–6.5)

## 2014-05-17 LAB — T4, FREE: Free T4: 1.56 ng/dL (ref 0.60–1.60)

## 2014-05-17 NOTE — Addendum Note (Signed)
Addended by: Loney Loh on: 05/17/2014 08:11 AM   Modules accepted: Orders

## 2014-05-17 NOTE — Addendum Note (Signed)
Addended by: Loney Loh on: 05/17/2014 07:57 AM   Modules accepted: Orders

## 2014-05-25 ENCOUNTER — Encounter (HOSPITAL_COMMUNITY)
Admission: EM | Disposition: A | Discharge status: Home Health | Payer: Self-pay | Source: Home / Self Care | Attending: Orthopedic Surgery

## 2014-05-25 ENCOUNTER — Emergency Department (HOSPITAL_COMMUNITY): Payer: Medicare Other

## 2014-05-25 ENCOUNTER — Emergency Department (HOSPITAL_COMMUNITY): Payer: Medicare Other | Admitting: Certified Registered"

## 2014-05-25 ENCOUNTER — Inpatient Hospital Stay (HOSPITAL_COMMUNITY)
Admission: EM | Admit: 2014-05-25 | Discharge: 2014-05-27 | DRG: 482 | Disposition: A | Discharge status: Home Health | Payer: Medicare Other | Attending: Orthopedic Surgery | Admitting: Orthopedic Surgery

## 2014-05-25 ENCOUNTER — Encounter (HOSPITAL_COMMUNITY): Payer: Self-pay | Admitting: Emergency Medicine

## 2014-05-25 ENCOUNTER — Encounter (HOSPITAL_COMMUNITY): Payer: Medicare Other | Admitting: Certified Registered"

## 2014-05-25 DIAGNOSIS — Z9861 Coronary angioplasty status: Secondary | ICD-10-CM

## 2014-05-25 DIAGNOSIS — S7223XA Displaced subtrochanteric fracture of unspecified femur, initial encounter for closed fracture: Secondary | ICD-10-CM | POA: Diagnosis present

## 2014-05-25 DIAGNOSIS — I252 Old myocardial infarction: Secondary | ICD-10-CM | POA: Diagnosis not present

## 2014-05-25 DIAGNOSIS — Z951 Presence of aortocoronary bypass graft: Secondary | ICD-10-CM | POA: Diagnosis not present

## 2014-05-25 DIAGNOSIS — S72143A Displaced intertrochanteric fracture of unspecified femur, initial encounter for closed fracture: Secondary | ICD-10-CM | POA: Diagnosis present

## 2014-05-25 DIAGNOSIS — S7290XA Unspecified fracture of unspecified femur, initial encounter for closed fracture: Secondary | ICD-10-CM | POA: Diagnosis present

## 2014-05-25 DIAGNOSIS — E039 Hypothyroidism, unspecified: Secondary | ICD-10-CM | POA: Diagnosis present

## 2014-05-25 DIAGNOSIS — Z800 Family history of malignant neoplasm of digestive organs: Secondary | ICD-10-CM | POA: Diagnosis not present

## 2014-05-25 DIAGNOSIS — E119 Type 2 diabetes mellitus without complications: Secondary | ICD-10-CM | POA: Diagnosis present

## 2014-05-25 DIAGNOSIS — K219 Gastro-esophageal reflux disease without esophagitis: Secondary | ICD-10-CM | POA: Diagnosis present

## 2014-05-25 DIAGNOSIS — Z87891 Personal history of nicotine dependence: Secondary | ICD-10-CM

## 2014-05-25 DIAGNOSIS — M4120 Other idiopathic scoliosis, site unspecified: Secondary | ICD-10-CM | POA: Diagnosis present

## 2014-05-25 DIAGNOSIS — Z8249 Family history of ischemic heart disease and other diseases of the circulatory system: Secondary | ICD-10-CM

## 2014-05-25 DIAGNOSIS — I2510 Atherosclerotic heart disease of native coronary artery without angina pectoris: Secondary | ICD-10-CM | POA: Diagnosis present

## 2014-05-25 DIAGNOSIS — M79609 Pain in unspecified limb: Secondary | ICD-10-CM | POA: Diagnosis present

## 2014-05-25 DIAGNOSIS — S72309A Unspecified fracture of shaft of unspecified femur, initial encounter for closed fracture: Secondary | ICD-10-CM | POA: Diagnosis present

## 2014-05-25 DIAGNOSIS — I10 Essential (primary) hypertension: Secondary | ICD-10-CM | POA: Diagnosis present

## 2014-05-25 DIAGNOSIS — W138XXA Fall from, out of or through other building or structure, initial encounter: Secondary | ICD-10-CM | POA: Diagnosis present

## 2014-05-25 DIAGNOSIS — Y93H9 Activity, other involving exterior property and land maintenance, building and construction: Secondary | ICD-10-CM

## 2014-05-25 DIAGNOSIS — S7292XA Unspecified fracture of left femur, initial encounter for closed fracture: Secondary | ICD-10-CM

## 2014-05-25 HISTORY — PX: FEMUR IM NAIL: SHX1597

## 2014-05-25 LAB — COMPREHENSIVE METABOLIC PANEL
ALT: 23 U/L (ref 0–53)
AST: 25 U/L (ref 0–37)
Albumin: 4.1 g/dL (ref 3.5–5.2)
Alkaline Phosphatase: 71 U/L (ref 39–117)
Anion gap: 15 (ref 5–15)
BUN: 20 mg/dL (ref 6–23)
CO2: 22 mEq/L (ref 19–32)
Calcium: 9 mg/dL (ref 8.4–10.5)
Chloride: 100 mEq/L (ref 96–112)
Creatinine, Ser: 1.16 mg/dL (ref 0.50–1.35)
GFR calc Af Amer: 73 mL/min — ABNORMAL LOW (ref >90)
GFR calc non Af Amer: 63 mL/min — ABNORMAL LOW (ref >90)
Glucose, Bld: 113 mg/dL — ABNORMAL HIGH (ref 70–99)
Potassium: 4.4 mEq/L (ref 3.7–5.3)
Sodium: 137 mEq/L (ref 137–147)
Total Bilirubin: <0.2 mg/dL — ABNORMAL LOW (ref 0.3–1.2)
Total Protein: 6.8 g/dL (ref 6.0–8.3)

## 2014-05-25 LAB — TYPE AND SCREEN
ABO/RH(D): O POS
Antibody Screen: NEGATIVE

## 2014-05-25 LAB — PROTIME-INR
INR: 1.06 (ref 0.00–1.49)
Prothrombin Time: 13.8 seconds (ref 11.6–15.2)

## 2014-05-25 LAB — ETHANOL: Alcohol, Ethyl (B): <11 mg/dL (ref 0–11)

## 2014-05-25 LAB — ABO/RH: ABO/RH(D): O POS

## 2014-05-25 SURGERY — INSERTION, INTRAMEDULLARY ROD, FEMUR
Anesthesia: General | Site: Leg Upper | Laterality: Left

## 2014-05-25 MED ORDER — SODIUM CHLORIDE 0.9 % IV BOLUS (SEPSIS)
1000.0000 mL | Freq: Once | INTRAVENOUS | Status: AC
  Administered 2014-05-25: 1000 mL via INTRAVENOUS

## 2014-05-25 MED ORDER — FENTANYL CITRATE 0.05 MG/ML IJ SOLN
INTRAMUSCULAR | Status: AC
  Filled 2014-05-25: qty 5

## 2014-05-25 MED ORDER — PROPOFOL 10 MG/ML IV BOLUS
INTRAVENOUS | Status: DC | PRN
  Administered 2014-05-25: 160 mg via INTRAVENOUS

## 2014-05-25 MED ORDER — LACTATED RINGERS IV SOLN
INTRAVENOUS | Status: DC | PRN
  Administered 2014-05-25 – 2014-05-26 (×2): via INTRAVENOUS

## 2014-05-25 MED ORDER — MORPHINE SULFATE 4 MG/ML IJ SOLN
4.0000 mg | Freq: Once | INTRAMUSCULAR | Status: AC
  Administered 2014-05-25: 4 mg via INTRAVENOUS
  Filled 2014-05-25: qty 1

## 2014-05-25 MED ORDER — LIDOCAINE HCL (CARDIAC) 20 MG/ML IV SOLN
INTRAVENOUS | Status: DC | PRN
  Administered 2014-05-25: 30 mg via INTRAVENOUS

## 2014-05-25 MED ORDER — FENTANYL CITRATE 0.05 MG/ML IJ SOLN
INTRAMUSCULAR | Status: DC | PRN
  Administered 2014-05-25 (×3): 50 ug via INTRAVENOUS

## 2014-05-25 MED ORDER — ROCURONIUM BROMIDE 100 MG/10ML IV SOLN
INTRAVENOUS | Status: DC | PRN
  Administered 2014-05-25: 20 mg via INTRAVENOUS

## 2014-05-25 MED ORDER — PROPOFOL 10 MG/ML IV BOLUS
INTRAVENOUS | Status: AC
  Filled 2014-05-25: qty 20

## 2014-05-25 MED ORDER — MIDAZOLAM HCL 5 MG/5ML IJ SOLN
INTRAMUSCULAR | Status: DC | PRN
  Administered 2014-05-25: 1 mg via INTRAVENOUS

## 2014-05-25 MED ORDER — MIDAZOLAM HCL 2 MG/2ML IJ SOLN
INTRAMUSCULAR | Status: AC
  Filled 2014-05-25: qty 2

## 2014-05-25 MED ORDER — CEFAZOLIN SODIUM-DEXTROSE 2-3 GM-% IV SOLR
INTRAVENOUS | Status: DC | PRN
  Administered 2014-05-25: 2 g via INTRAVENOUS

## 2014-05-25 MED ORDER — SUCCINYLCHOLINE CHLORIDE 20 MG/ML IJ SOLN
INTRAMUSCULAR | Status: DC | PRN
  Administered 2014-05-25: 120 mg via INTRAVENOUS

## 2014-05-25 MED ORDER — PHENYLEPHRINE HCL 10 MG/ML IJ SOLN
INTRAMUSCULAR | Status: DC | PRN
  Administered 2014-05-25 – 2014-05-26 (×6): 80 ug via INTRAVENOUS

## 2014-05-25 MED ORDER — ONDANSETRON HCL 4 MG/2ML IJ SOLN
4.0000 mg | Freq: Once | INTRAMUSCULAR | Status: AC
Start: 2014-05-25 — End: 2014-05-25
  Administered 2014-05-25: 4 mg via INTRAVENOUS
  Filled 2014-05-25: qty 2

## 2014-05-25 SURGICAL SUPPLY — 67 items
BANDAGE ELASTIC 6 VELCRO ST LF (GAUZE/BANDAGES/DRESSINGS) ×6 IMPLANT
BIT DRILL LONG 4.0MM (BIT) ×2 IMPLANT
BIT DRILL SHORT 4.0 (BIT) ×2 IMPLANT
BLADE SURG 15 STRL LF DISP TIS (BLADE) ×1 IMPLANT
BLADE SURG 15 STRL SS (BLADE) ×2
BLADE SURG ROTATE 9660 (MISCELLANEOUS) ×3 IMPLANT
CATH FOLEY LATEX FREE 16FR (CATHETERS) ×2
CATH FOLEY LF 16FR (CATHETERS) ×1 IMPLANT
COVER PERINEAL POST (MISCELLANEOUS) ×3 IMPLANT
COVER SURGICAL LIGHT HANDLE (MISCELLANEOUS) ×3 IMPLANT
DRAPE INCISE IOBAN 66X45 STRL (DRAPES) ×3 IMPLANT
DRAPE ORTHO SPLIT 77X108 STRL (DRAPES)
DRAPE PROXIMA HALF (DRAPES) ×3 IMPLANT
DRAPE STERI IOBAN 125X83 (DRAPES) ×3 IMPLANT
DRAPE SURG ORHT 6 SPLT 77X108 (DRAPES) IMPLANT
DRILL BIT LONG 4.0MM (BIT) ×6
DRILL BIT SHORT 4.0 (BIT) ×4
DRILL STEP  6.4 (BIT) ×3 IMPLANT
DRSG MEPILEX BORDER 4X12 (GAUZE/BANDAGES/DRESSINGS) ×3 IMPLANT
DRSG MEPILEX BORDER 4X4 (GAUZE/BANDAGES/DRESSINGS) ×3 IMPLANT
DRSG MEPILEX BORDER 4X8 (GAUZE/BANDAGES/DRESSINGS) ×3 IMPLANT
DURAPREP 26ML APPLICATOR (WOUND CARE) ×3 IMPLANT
ELECT REM PT RETURN 9FT ADLT (ELECTROSURGICAL) ×3
ELECTRODE REM PT RTRN 9FT ADLT (ELECTROSURGICAL) ×1 IMPLANT
FACESHIELD WRAPAROUND (MASK) ×6 IMPLANT
GAUZE XEROFORM 5X9 LF (GAUZE/BANDAGES/DRESSINGS) ×3 IMPLANT
GLOVE BIO SURGEON STRL SZ8.5 (GLOVE) ×6 IMPLANT
GLOVE BIOGEL PI IND STRL 8 (GLOVE) ×2 IMPLANT
GLOVE BIOGEL PI IND STRL 8.5 (GLOVE) ×1 IMPLANT
GLOVE BIOGEL PI INDICATOR 8 (GLOVE) ×4
GLOVE BIOGEL PI INDICATOR 8.5 (GLOVE) ×2
GLOVE SURG ORTHO 8.0 STRL STRW (GLOVE) ×6 IMPLANT
GOWN STRL REUS W/ TWL LRG LVL3 (GOWN DISPOSABLE) ×1 IMPLANT
GOWN STRL REUS W/ TWL XL LVL3 (GOWN DISPOSABLE) IMPLANT
GOWN STRL REUS W/TWL LRG LVL3 (GOWN DISPOSABLE) ×2
GOWN STRL REUS W/TWL XL LVL3 (GOWN DISPOSABLE)
GUIDE PIN 3.2MM (MISCELLANEOUS) ×4
GUIDE PIN ORTH 343X3.2XBRAD (MISCELLANEOUS) ×2 IMPLANT
GUIDE ROD 3.0 (MISCELLANEOUS) ×3
KIT BASIN OR (CUSTOM PROCEDURE TRAY) ×3 IMPLANT
KIT ROOM TURNOVER OR (KITS) ×3 IMPLANT
LINER BOOT UNIVERSAL DISP (MISCELLANEOUS) ×3 IMPLANT
MANIFOLD NEPTUNE II (INSTRUMENTS) ×3 IMPLANT
NAIL FEMORAL 10X40 L (Nail) ×3 IMPLANT
NS IRRIG 1000ML POUR BTL (IV SOLUTION) ×3 IMPLANT
PACK GENERAL/GYN (CUSTOM PROCEDURE TRAY) ×3 IMPLANT
PAD ARMBOARD 7.5X6 YLW CONV (MISCELLANEOUS) ×6 IMPLANT
ROD GUIDE 3.0 (MISCELLANEOUS) ×1 IMPLANT
SCREW 6.4X90 (Screw) ×3 IMPLANT
SCREW LOCKING 5.0X80 (Screw) ×3 IMPLANT
SCREW TRIGEN LOW PROF 5.0X37.5 (Screw) ×3 IMPLANT
SCREW TRIGEN LOW PROF 5.0X45 (Screw) ×3 IMPLANT
SLEEVE CABLE (Orthopedic Implant) ×3 IMPLANT
SLEEVE CABLE 2MM VT (Orthopedic Implant) ×6 IMPLANT
SLEEVE SURGEON STRL (DRAPES) ×3 IMPLANT
SPONGE LAP 4X18 X RAY DECT (DISPOSABLE) IMPLANT
STAPLER VISISTAT 35W (STAPLE) ×3 IMPLANT
SUT ETHILON 2 0 FS 18 (SUTURE) IMPLANT
SUT VIC AB 0 CT1 27 (SUTURE) ×4
SUT VIC AB 0 CT1 27XBRD ANBCTR (SUTURE) ×2 IMPLANT
SUT VIC AB 1 CT1 27 (SUTURE) ×4
SUT VIC AB 1 CT1 27XBRD ANBCTR (SUTURE) ×2 IMPLANT
SUT VIC AB 2-0 CTB1 (SUTURE) ×12 IMPLANT
TAPE STRIPS DRAPE STRL (GAUZE/BANDAGES/DRESSINGS) IMPLANT
TOWEL OR 17X24 6PK STRL BLUE (TOWEL DISPOSABLE) ×3 IMPLANT
TOWEL OR 17X26 10 PK STRL BLUE (TOWEL DISPOSABLE) ×3 IMPLANT
WATER STERILE IRR 1000ML POUR (IV SOLUTION) ×3 IMPLANT

## 2014-05-25 NOTE — ED Notes (Signed)
Marlou Sa, MD at bedside.

## 2014-05-25 NOTE — ED Notes (Signed)
Pt taken to the OR.

## 2014-05-25 NOTE — Progress Notes (Signed)
Orthopedic Tech Progress Note Patient Details:  [name] Jan 04, 1946 [id number]  Patient ID: [name] [name], male   DOB: [birth year], 68 y.o.   MRN: [id number] Made level 2 trauma visit  Hildred Priest 05/25/2014, 9:52 PM

## 2014-05-25 NOTE — ED Notes (Signed)
Patient fell 8 feet from roof to ground.  Patient denies any LOC, full recall of incident.  Patient has obvious deformity left upper femur.  Splint applied to by EMS.  Patient is CAOx3 upon arrival to ED, GCS of 15, VSS.

## 2014-05-25 NOTE — Progress Notes (Signed)
05/25/14 2100  Clinical Encounter Type  Visited With Patient;Family  Visit Type ED;Trauma  Spiritual Encounters  Spiritual Needs Emotional  Stress Factors  Patient Stress Factors None identified  Family Stress Factors Major life changes   Chaplain responded to a level 2 trauma page in the ED. Patient was alert and able to speak. Chaplain spoke to patient and asked if he had any family on the way. Patient indicated his wife was aware of what happened and was on her way. Chaplain met patient's wife and son. Patient's wife explained that the patient has been working on the barn roof and fell off. Patient's family seem comforted and confident in the care being provided. Patient's family expressed concern for patient being alone when he had his accident. Patient's family also expressed concerns about patient not doing the type of work he has been doing in the future so that he does not hurt himself again. Patient's wife and son indicated that they did not expect any more family to come to the hospital until tomorrow and indicated that they did not need anything else at the moment. Chaplain will continue to provide emotional and spiritual support to patient and patient's family as needed. Mancel Bale R 9:57 PM

## 2014-05-25 NOTE — Anesthesia Preprocedure Evaluation (Addendum)
Anesthesia Evaluation  Patient identified by MRN, date of birth, ID band Patient awake    Reviewed: Allergy & Precautions, H&P , NPO status , Patient's Chart, lab work & pertinent test results  History of Anesthesia Complications (+) history of anesthetic complications  Airway Mallampati: II TM Distance: >3 FB Neck ROM: Full    Dental  (+) Teeth Intact, Dental Advisory Given   Pulmonary former smoker,  breath sounds clear to auscultation        Cardiovascular hypertension, Pt. on medications + CAD, + Past MI, + Cardiac Stents and + CABG (2004) Rhythm:Regular Rate:Normal     Neuro/Psych    GI/Hepatic GERD-  ,  Endo/Other  diabetes, Type 2, Oral Hypoglycemic AgentsHypothyroidism   Renal/GU      Musculoskeletal   Abdominal   Peds  Hematology   Anesthesia Other Findings   Reproductive/Obstetrics                         Anesthesia Physical Anesthesia Plan  ASA: III and emergent  Anesthesia Plan: General   Post-op Pain Management:    Induction: Intravenous and Rapid sequence  Airway Management Planned: Oral ETT  Additional Equipment:   Intra-op Plan:   Post-operative Plan: Extubation in OR  Informed Consent: I have reviewed the patients History and Physical, chart, labs and discussed the procedure including the risks, benefits and alternatives for the proposed anesthesia with the patient or authorized representative who has indicated his/her understanding and acceptance.   Dental advisory given  Plan Discussed with: Anesthesiologist, Surgeon and CRNA  Anesthesia Plan Comments: (68 year old male S/P fall from roof  1. L. Proximal femur fracture 2. CAD S/P MI and CABG 2004, no angina last myoview 2012 no ischemia 3. Type 2 DM glucose 113 4. Hypertension 5. Low back pain )       Anesthesia Quick Evaluation

## 2014-05-25 NOTE — ED Notes (Signed)
Pt states that his last meal was at 1700.

## 2014-05-25 NOTE — Anesthesia Procedure Notes (Signed)
Procedure Name: Intubation Date/Time: 05/25/2014 11:09 PM Performed by: Manuela Schwartz B Pre-anesthesia Checklist: Patient identified, Emergency Drugs available, Suction available, Patient being monitored and Timeout performed Patient Re-evaluated:Patient Re-evaluated prior to inductionOxygen Delivery Method: Circle system utilized Preoxygenation: Pre-oxygenation with 100% oxygen Intubation Type: IV induction and Rapid sequence Grade View: Grade I Tube type: Oral Tube size: 7.5 mm Number of attempts: 1 Airway Equipment and Method: Stylet and Video-laryngoscopy (elective glidescope intubation) Placement Confirmation: ETT inserted through vocal cords under direct vision,  positive ETCO2 and breath sounds checked- equal and bilateral Secured at: 22 cm Tube secured with: Tape Dental Injury: Teeth and Oropharynx as per pre-operative assessment

## 2014-05-25 NOTE — ED Provider Notes (Signed)
TIME SEEN: 9:00 PM  CHIEF COMPLAINT: Fall, left femur fracture  HPI: Patient is a 68 year old male with history of coronary artery disease status post CABG, hypertension, diabetes, hypothyroidism who presents emergency department at he fell off of his barn roof approximately 8-10 feet to the ground. Patient reports that he was painting his roof when he slipped and fell. He didn't hit his head but did not lose consciousness. He has an obvious deformity of the left femur. Neurovascular intact distally. Denies any chest pain, shortness of breath, abdominal pain, neck or back pain. No numbness, tingling or focal weakness. Not on anticoagulation.  ROS: See HPI Constitutional: no fever  Eyes: no drainage  ENT: no runny nose   Cardiovascular:  no chest pain  Resp: no SOB  GI: no vomiting GU: no dysuria Integumentary: no rash  Allergy: no hives  Musculoskeletal: no leg swelling  Neurological: no slurred speech ROS otherwise negative  PAST MEDICAL HISTORY/PAST SURGICAL HISTORY:  Past Medical History  Diagnosis Date  . Inguinal hernia   . AMI (acute mesenteric ischemia)   . HTN (hypertension)   . Hypothyroidism   . Colon polyps   . Hemorrhoids   . GERD (gastroesophageal reflux disease)   . Diverticulosis   . Rectal bleeding   . Heart attack 2004  . Arthritis, lumbar spine   . Lumbar scoliosis   . Diabetes mellitus without complication   . Allergy   . Cataract     MEDICATIONS:  Prior to Admission medications   Medication Sig Start Date End Date Taking? Authorizing Provider  amLODipine (NORVASC) 5 MG tablet Take 1 tablet (5 mg total) by mouth daily. 02/06/14 02/06/15  Sherren Mocha, MD  aspirin 81 MG tablet Take 81 mg by mouth daily.    Historical Provider, MD  celecoxib (CELEBREX) 200 MG capsule  12/21/13   Historical Provider, MD  cyclobenzaprine (FLEXERIL) 10 MG tablet  12/21/13   Historical Provider, MD  esomeprazole (NEXIUM) 40 MG capsule Take 1 capsule (40 mg total) by mouth  daily before breakfast. 10/29/12   Sable Feil, MD  ezetimibe-simvastatin (VYTORIN) 10-20 MG per tablet Take 1 tablet by mouth at bedtime. 11/10/13 11/10/14  Sherren Mocha, MD  fexofenadine (ALLEGRA) 180 MG tablet Take 180 mg by mouth daily.      Historical Provider, MD  folic acid (FOLVITE) 1 MG tablet Take 800 mcg by mouth daily.     Historical Provider, MD  levothyroxine (SYNTHROID, LEVOTHROID) 137 MCG tablet Take 1 tablet (137 mcg total) by mouth daily before breakfast. 03/15/14   Elayne Snare, MD  metFORMIN (GLUCOPHAGE-XR) 500 MG 24 hr tablet Take 3 tablets (1,500 mg total) by mouth at bedtime. 05/10/14   Elayne Snare, MD  multivitamin [facility]) per tablet Take 1 tablet by mouth daily.      Historical Provider, MD  niacin (NIASPAN) 500 MG CR tablet Take 1 tablet (500 mg total) by mouth at bedtime. 11/10/13   Sherren Mocha, MD  ramipril (ALTACE) 10 MG capsule Take 1 capsule (10 mg total) by mouth daily. 11/02/13   Sherren Mocha, MD  traMADol [facility]at[facility]bon) 50 MG tablet  07/30/12   Historical Provider, MD    ALLERGIES:  No Known Allergies  SOCIAL HISTORY:  History  Substance Use Topics  . Smoking status: Former Smoker -- 1.00 packs/day for 10 years    Types: Cigarettes    Quit date: 09/08/1973  . Smokeless tobacco: Never Used  . Alcohol Use: No    FAMILY HISTORY: Family History  Problem Relation Age of Onset  . Heart disease Father   . Colon cancer Cousin     EXAM: BP 130/78  Temp(Src) 98.4 F (36.9 C) (Oral)  Resp 14  SpO2 100% CONSTITUTIONAL: Alert and oriented and responds appropriately to questions. Well-appearing; well-nourished; GCS 15 HEAD: Normocephalic; atraumatic EYES: Conjunctivae clear, PERRL, EOMI ENT: normal nose; no rhinorrhea; moist mucous membranes; pharynx without lesions noted; no dental injury; no septal hematoma NECK: Supple, no meningismus, no LAD; no midline spinal tenderness, step-off or deformity CARD: RRR; S1 and S2 appreciated; no murmurs, no clicks, no  rubs, no gallops RESP: Normal chest excursion without splinting or tachypnea; breath sounds clear and equal bilaterally; no wheezes, no rhonchi, no rales; chest wall stable, nontender to palpation ABD/GI: Normal bowel sounds; non-distended; soft, non-tender, no rebound, no guarding PELVIS:  stable, nontender to palpation BACK:  The back appears normal and is non-tender to palpation, there is no CVA tenderness; no midline spinal tenderness, step-off or deformity EXT: Patient has an obvious left proximal femur deformity, compartments are soft, 2+ radial and DP pulses bilaterally, otherwise Normal ROM in all joints; otherwise extremities are non-tender to palpation; no edema; normal capillary refill; no cyanosis    SKIN: Normal color for age and race; warm NEURO: Moves all extremities equally PSYCH: The patient's mood and manner are appropriate. Grooming and personal hygiene are appropriate.  MEDICAL DECISION MAKING: Patient here with fall. He has an obvious left femur fracture that is confirmed on x-ray. We'll obtain CT of his head and cervical spine. We'll obtain chest x-ray and pelvis x-ray. No other sign of trauma on exam. He is neurovascularly intact distally, hemodynamically stable. Discussed with Dr. Marlou Sa with orthopedic surgery who will see the patient. He is requesting a trauma consult as well. Discussed with trauma surgeon who reports a given patient had an isolated left femur fracture, trauma does not need to be involved.  ED PROGRESS: No other sign of injury on exam. He is still hemodynamically stable. CT head, cervical spine negative. Chest x-ray clear. Cervical spine cleared clinically and c-collar removed. Dr. Marlou Sa with orthopedic surgery to admit.     [place], DO 05/25/14 2258

## 2014-05-25 NOTE — H&P (Signed)
[name] [name] is an 68 y.o. male.   Chief Complaint: Left leg pain HPI: [name] right is a 68 year old patient who is up on the roof of the arm painting today when he fell. He reports left leg pain only he denies any loss of consciousness denies any other neck pain extremity pain or right lower extremity  pain other than left leg pain. Denies any abdominal pain. He does have a cardiac history but has had no recent chest pain or shortness of breath. He is not taking blood thinners by his history.  Past Medical History  Diagnosis Date  . Inguinal hernia   . AMI (acute mesenteric ischemia)   . HTN (hypertension)   . Hypothyroidism   . Colon polyps   . Hemorrhoids   . GERD (gastroesophageal reflux disease)   . Diverticulosis   . Rectal bleeding   . Heart attack 2004  . Arthritis, lumbar spine   . Lumbar scoliosis   . Diabetes mellitus without complication   . Allergy   . Cataract     Past Surgical History  Procedure Laterality Date  . Appendectomy  1966  . Coronary artery bypass graft  2004    x 5 / one stent  . Coronary angioplasty with stent placement    . Inguinal hernia repair  1994    right side  . By-pass    . Lumbar laminotomy  10/2009  . Colonoscopy    . Polypectomy    . Cataract extraction      Bil  /   11/2012    Family History  Problem Relation Age of Onset  . Heart disease Father   . Colon cancer Cousin    Social History:  reports that he quit smoking about 40 years ago. His smoking use included Cigarettes. He has a 10 pack-year smoking history. He has never used smokeless tobacco. He reports that he does not drink alcohol or use illicit drugs.  Allergies: No Known Allergies   (Not in a hospital admission)  Results for orders placed during the hospital encounter of 05/25/14 (from the past 48 hour(s))  COMPREHENSIVE METABOLIC PANEL     Status: Abnormal   Collection Time    05/25/14  9:09 PM      Result Value Ref Range   Sodium 137  137 - 147 mEq/L    Potassium 4.4  3.7 - 5.3 mEq/L   Chloride 100  96 - 112 mEq/L   CO2 22  19 - 32 mEq/L   Glucose, Bld 113 (*) 70 - 99 mg/dL   BUN 20  6 - 23 mg/dL   Creatinine, Ser 1.16  0.50 - 1.35 mg/dL   Calcium 9.0  8.4 - 10.5 mg/dL   Total Protein 6.8  6.0 - 8.3 g/dL   Albumin 4.1  3.5 - 5.2 g/dL   AST 25  0 - 37 U/L   ALT 23  0 - 53 U/L   Alkaline Phosphatase 71  39 - 117 U/L   Total Bilirubin <0.2 (*) 0.3 - 1.2 mg/dL   GFR calc non Af Amer 63 (*) >90 mL/min   GFR calc Af Amer 73 (*) >90 mL/min   Comment: (NOTE)     The eGFR has been calculated using the CKD EPI equation.     This calculation has not been validated in all clinical situations.     eGFR's persistently <90 mL/min signify possible Chronic Kidney     Disease.   Anion gap 15  5 - 15  ETHANOL     Status: None   Collection Time    05/25/14  9:09 PM      Result Value Ref Range   Alcohol, Ethyl (B) <11  0 - 11 mg/dL   Comment:            LOWEST DETECTABLE LIMIT FOR     SERUM ALCOHOL IS 11 mg/dL     FOR MEDICAL PURPOSES ONLY  PROTIME-INR     Status: None   Collection Time    05/25/14  9:09 PM      Result Value Ref Range   Prothrombin Time 13.8  11.6 - 15.2 seconds   INR 1.06  0.00 - 1.49  TYPE AND SCREEN     Status: None   Collection Time    05/25/14  9:15 PM      Result Value Ref Range   ABO/RH(D) O POS     Antibody Screen NEG     Sample Expiration 05/28/2014    ABO/RH     Status: None   Collection Time    05/25/14  9:15 PM      Result Value Ref Range   ABO/RH(D) O POS     Ct Head Wo Contrast  05/25/2014   CLINICAL DATA:  68 year old male with 8 foot fall from ladder with head and neck injury.  EXAM: CT HEAD WITHOUT CONTRAST  CT CERVICAL SPINE WITHOUT CONTRAST  TECHNIQUE: Multidetector CT imaging of the head and cervical spine was performed following the standard protocol without intravenous contrast. Multiplanar CT image reconstructions of the cervical spine were also generated.  COMPARISON:  01/02/2009 CT is  FINDINGS:  CT HEAD FINDINGS  No intracranial abnormalities are identified, including mass lesion or mass effect, hydrocephalus, extra-axial fluid collection, midline shift, hemorrhage, or acute infarction.  The visualized bony calvarium is unremarkable.  CT CERVICAL SPINE FINDINGS  Normal alignment is noted.  There is no evidence of acute fracture, subluxation or prevertebral soft tissue swelling.  Mild to moderate degenerative disc disease and spondylosis from C3-C7 noted.  These findings contribute to mild to moderate bony foraminal narrowing at several levels, greatest on the right at C4-5 and C5-6. No focal bony lesions are present.  The soft tissue structures are unremarkable.  IMPRESSION: Unremarkable noncontrast head CT.  No static evidence of acute injury to the cervical spine.  Mild to moderate multilevel degenerative changes of the cervical spine contributing to bony foraminal narrowing at several levels, greatest on the right at C4-5 and C5-6.   Electronically Signed   By: Hassan Rowan M.D.   On: 05/25/2014 22:29   Ct Cervical Spine Wo Contrast  05/25/2014   CLINICAL DATA:  68 year old male with 8 foot fall from ladder with head and neck injury.  EXAM: CT HEAD WITHOUT CONTRAST  CT CERVICAL SPINE WITHOUT CONTRAST  TECHNIQUE: Multidetector CT imaging of the head and cervical spine was performed following the standard protocol without intravenous contrast. Multiplanar CT image reconstructions of the cervical spine were also generated.  COMPARISON:  01/02/2009 CT is  FINDINGS: CT HEAD FINDINGS  No intracranial abnormalities are identified, including mass lesion or mass effect, hydrocephalus, extra-axial fluid collection, midline shift, hemorrhage, or acute infarction.  The visualized bony calvarium is unremarkable.  CT CERVICAL SPINE FINDINGS  Normal alignment is noted.  There is no evidence of acute fracture, subluxation or prevertebral soft tissue swelling.  Mild to moderate degenerative disc disease and spondylosis  from C3-C7 noted.  These findings  contribute to mild to moderate bony foraminal narrowing at several levels, greatest on the right at C4-5 and C5-6. No focal bony lesions are present.  The soft tissue structures are unremarkable.  IMPRESSION: Unremarkable noncontrast head CT.  No static evidence of acute injury to the cervical spine.  Mild to moderate multilevel degenerative changes of the cervical spine contributing to bony foraminal narrowing at several levels, greatest on the right at C4-5 and C5-6.   Electronically Signed   By: Hassan Rowan M.D.   On: 05/25/2014 22:29   Dg Pelvis Portable  05/25/2014   CLINICAL DATA:  Fall, trauma  EXAM: PORTABLE PELVIS 1-2 VIEWS  COMPARISON:  None.  FINDINGS: There is a comminuted subtrochanteric fracture of the proximal left femoral shaft. The left femoral head remains normally position within the acetabulum. Femoral head is intact. There appears to be extension into the intertrochanteric region.  Bony pelvis is intact. No pubic diastasis. SI joints are approximated. Right hip is intact.  Degenerative changes and within the lower lumbar spine.  IMPRESSION: Acute comminuted fracture of the proximal left femoral shaft with intertrochanteric extension.   Electronically Signed   By: Jeannine Boga M.D.   On: 05/25/2014 21:57   Dg Chest Portable 1 View  05/25/2014   CLINICAL DATA:  Fall with injury and pain.  EXAM: PORTABLE CHEST - 1 VIEW  COMPARISON:  None.  FINDINGS: The cardiomediastinal silhouette is unremarkable.  CABG changes are identified.  Elevation of the right hemidiaphragm may be chronic.  A remote appearing fracture of the left seventh rib is noted.  There is no evidence of focal airspace disease, pulmonary edema, suspicious pulmonary nodule/mass, pleural effusion, or pneumothorax.  No acute bony abnormalities are identified.  IMPRESSION: Elevation of the right hemidiaphragm which may be chronic.  No other significant abnormalities identified.   Electronically  Signed   By: Hassan Rowan M.D.   On: 05/25/2014 22:01   Dg Femur Left Port  05/25/2014   CLINICAL DATA:  Status post fall approximately 8 feet.  EXAM: PORTABLE LEFT FEMUR - 2 VIEW  COMPARISON:  None.  FINDINGS: There is displaced fracture of the proximal left femoral shaft extending into the medial inferior trochanteric region. There is no dislocation.  IMPRESSION: Displaced fracture of the proximal femur.   Electronically Signed   By: Abelardo Diesel M.D.   On: 05/25/2014 21:50    Review of Systems  Constitutional: Negative.   HENT: Negative.   Eyes: Negative.   Respiratory: Negative.   Cardiovascular: Negative.   Gastrointestinal: Negative.   Genitourinary: Negative.   Musculoskeletal: Positive for joint pain. Falls: [id number].  Skin: Negative.   Neurological: Negative.   Endo/Heme/Allergies: Negative.   Psychiatric/Behavioral: Negative.     Blood pressure 127/73, pulse 84, temperature 98.4 F (36.9 C), temperature source Oral, resp. rate 13, SpO2 97.00%. Physical Exam  Constitutional: He appears well-developed.  HENT:  Head: Normocephalic.  Eyes: Pupils are equal, round, and reactive to light.  Neck: Normal range of motion.  Cardiovascular: Normal rate.   Respiratory: Effort normal.  GI: Soft.  Neurological: He is alert.  Skin: Skin is warm.  Psychiatric: He has a normal mood and affect.   examination the left lower extremity demonstrates shortening he does have palpable pedal pulse on the left no knee effusion on either side. No groin pain with internal extra rotation the right leg ankle range of motion right is intact bilateral upper extremity range of motion is full and intact at the wrist  shoulder and elbows. Clavicles nontender to palpation pelvis nontender to palpation neck nontender to palpation he is a cervical collar but has reasonable neck range of motion. Does have pain in the proximal femur region on the left-hand side. Sensation intact dorsi flexion plantar flexion  intact on the left   Assessment/Plan Impression is left proximal femur fracture which appears to be an isolated injury there was no loss of consciousness and no other extremity pain no abdominal pain. He does have cardiac history but has had no recent chest pain or shortness of breath. Plan is for open reduction with table placement along with intramedullary nail using a recon type nail. Risk and benefits discussed with the patient and his wife including but not limited to infection nerve vessel damage nonunion malunion need for more surgery as well as the perioperative events of stroke death MI blood clot formation. Patient understands risk and benefits and agrees to proceed with surgical intervention tonight all questions answered  DEAN,GREGORY SCOTT 05/25/2014, 10:34 PM

## 2014-05-26 ENCOUNTER — Inpatient Hospital Stay (HOSPITAL_COMMUNITY): Payer: Medicare Other

## 2014-05-26 LAB — GLUCOSE, CAPILLARY
Glucose-Capillary: 148 mg/dL — ABNORMAL HIGH (ref 70–99)
Glucose-Capillary: 173 mg/dL — ABNORMAL HIGH (ref 70–99)
Glucose-Capillary: 110 mg/dL — ABNORMAL HIGH (ref 70–99)
Glucose-Capillary: 105 mg/dL — ABNORMAL HIGH (ref 70–99)
Glucose-Capillary: 117 mg/dL — ABNORMAL HIGH (ref 70–99)
Glucose-Capillary: 110 mg/dL — ABNORMAL HIGH (ref 70–99)

## 2014-05-26 LAB — URINALYSIS, ROUTINE W REFLEX MICROSCOPIC
Bilirubin Urine: NEGATIVE
Glucose, UA: NEGATIVE mg/dL
Ketones, ur: NEGATIVE mg/dL
Nitrite: NEGATIVE
Protein, ur: NEGATIVE mg/dL
Specific Gravity, Urine: 1.019 (ref 1.005–1.030)
Urobilinogen, UA: 0.2 mg/dL (ref 0.0–1.0)
pH: 7 (ref 5.0–8.0)

## 2014-05-26 LAB — BASIC METABOLIC PANEL
Anion gap: 11 (ref 5–15)
BUN: 14 mg/dL (ref 6–23)
CO2: 24 mEq/L (ref 19–32)
Calcium: 8 mg/dL — ABNORMAL LOW (ref 8.4–10.5)
Chloride: 101 mEq/L (ref 96–112)
Creatinine, Ser: 0.91 mg/dL (ref 0.50–1.35)
GFR calc Af Amer: >90 mL/min (ref >90)
GFR calc non Af Amer: 85 mL/min — ABNORMAL LOW (ref >90)
Glucose, Bld: 124 mg/dL — ABNORMAL HIGH (ref 70–99)
Potassium: 4.7 mEq/L (ref 3.7–5.3)
Sodium: 136 mEq/L — ABNORMAL LOW (ref 137–147)

## 2014-05-26 LAB — URINE MICROSCOPIC-ADD ON

## 2014-05-26 MED ORDER — RAMIPRIL 10 MG PO CAPS
10.0000 mg | ORAL_CAPSULE | Freq: Every day | ORAL | Status: DC
  Administered 2014-05-26 – 2014-05-27 (×2): 10 mg via ORAL
  Filled 2014-05-26 (×2): qty 1

## 2014-05-26 MED ORDER — TRAMADOL HCL 50 MG PO TABS
50.0000 mg | ORAL_TABLET | Freq: Four times a day (QID) | ORAL | Status: DC | PRN
  Administered 2014-05-26 – 2014-05-27 (×3): 50 mg via ORAL
  Filled 2014-05-26 (×3): qty 1

## 2014-05-26 MED ORDER — ONDANSETRON HCL 4 MG/2ML IJ SOLN: 4.0000 mg | Freq: Four times a day (QID) | INTRAMUSCULAR | Status: DC | PRN

## 2014-05-26 MED ORDER — OXYCODONE HCL 5 MG PO TABS: 5.0000 mg | ORAL_TABLET | ORAL | Status: DC | PRN

## 2014-05-26 MED ORDER — NEOSTIGMINE METHYLSULFATE 10 MG/10ML IV SOLN
INTRAVENOUS | Status: DC | PRN
  Administered 2014-05-26: 3 mg via INTRAVENOUS

## 2014-05-26 MED ORDER — CEFAZOLIN SODIUM 1-5 GM-% IV SOLN
1.0000 g | Freq: Four times a day (QID) | INTRAVENOUS | Status: AC
  Administered 2014-05-26 (×2): 1 g via INTRAVENOUS
  Filled 2014-05-26 (×2): qty 50

## 2014-05-26 MED ORDER — LORATADINE 10 MG PO TABS
10.0000 mg | ORAL_TABLET | Freq: Every day | ORAL | Status: DC
  Administered 2014-05-26 – 2014-05-27 (×2): 10 mg via ORAL
  Filled 2014-05-26 (×2): qty 1

## 2014-05-26 MED ORDER — ARTIFICIAL TEARS OP OINT
TOPICAL_OINTMENT | OPHTHALMIC | Status: AC
  Filled 2014-05-26: qty 7

## 2014-05-26 MED ORDER — LEVOTHYROXINE SODIUM 137 MCG PO TABS
137.0000 ug | ORAL_TABLET | Freq: Every day | ORAL | Status: DC
  Administered 2014-05-26 – 2014-05-27 (×2): 137 ug via ORAL
  Filled 2014-05-26 (×3): qty 1

## 2014-05-26 MED ORDER — PHENYLEPHRINE 40 MCG/ML (10ML) SYRINGE FOR IV PUSH (FOR BLOOD PRESSURE SUPPORT)
PREFILLED_SYRINGE | INTRAVENOUS | Status: AC
  Filled 2014-05-26: qty 40

## 2014-05-26 MED ORDER — ONDANSETRON HCL 4 MG/2ML IJ SOLN
4.0000 mg | Freq: Once | INTRAMUSCULAR | Status: AC | PRN
  Administered 2014-05-26: 4 mg via INTRAVENOUS

## 2014-05-26 MED ORDER — MORPHINE SULFATE 2 MG/ML IJ SOLN
1.0000 mg | INTRAMUSCULAR | Status: DC | PRN
  Administered 2014-05-26: 1 mg via INTRAVENOUS
  Filled 2014-05-26: qty 1

## 2014-05-26 MED ORDER — 0.9 % SODIUM CHLORIDE (POUR BTL) OPTIME
TOPICAL | Status: DC | PRN
  Administered 2014-05-26: 1000 mL

## 2014-05-26 MED ORDER — ASPIRIN 325 MG PO TABS
325.0000 mg | ORAL_TABLET | Freq: Every day | ORAL | Status: DC
Start: 2014-05-26 — End: 2017-03-20

## 2014-05-26 MED ORDER — AMLODIPINE BESYLATE 5 MG PO TABS
5.0000 mg | ORAL_TABLET | Freq: Every day | ORAL | Status: DC
  Administered 2014-05-26 – 2014-05-27 (×2): 5 mg via ORAL
  Filled 2014-05-26 (×2): qty 1

## 2014-05-26 MED ORDER — METOCLOPRAMIDE HCL 5 MG/ML IJ SOLN: 5.0000 mg | Freq: Three times a day (TID) | INTRAMUSCULAR | Status: DC | PRN

## 2014-05-26 MED ORDER — GLYCOPYRROLATE 0.2 MG/ML IJ SOLN
INTRAMUSCULAR | Status: DC | PRN
  Administered 2014-05-26: 0.4 mg via INTRAVENOUS

## 2014-05-26 MED ORDER — OXYCODONE HCL 5 MG PO TABS
5.0000 mg | ORAL_TABLET | ORAL | Status: DC | PRN
  Administered 2014-05-26: 5 mg via ORAL
  Administered 2014-05-26 – 2014-05-27 (×2): 10 mg via ORAL
  Filled 2014-05-26 (×2): qty 2
  Filled 2014-05-26: qty 1

## 2014-05-26 MED ORDER — ONDANSETRON HCL 4 MG/2ML IJ SOLN
INTRAMUSCULAR | Status: AC
  Filled 2014-05-26: qty 2

## 2014-05-26 MED ORDER — METFORMIN HCL ER 750 MG PO TB24
1500.0000 mg | ORAL_TABLET | Freq: Every day | ORAL | Status: DC
  Administered 2014-05-26: 1500 mg via ORAL
  Filled 2014-05-26 (×3): qty 2

## 2014-05-26 MED ORDER — HYDROCHLOROTHIAZIDE 25 MG PO TABS
25.0000 mg | ORAL_TABLET | Freq: Every day | ORAL | Status: DC | PRN
  Filled 2014-05-26: qty 1

## 2014-05-26 MED ORDER — METOCLOPRAMIDE HCL 10 MG PO TABS
5.0000 mg | ORAL_TABLET | Freq: Three times a day (TID) | ORAL | Status: DC | PRN
  Administered 2014-05-26: 10 mg via ORAL
  Filled 2014-05-26: qty 1

## 2014-05-26 MED ORDER — PANTOPRAZOLE SODIUM 40 MG PO TBEC
40.0000 mg | DELAYED_RELEASE_TABLET | Freq: Every day | ORAL | Status: DC
  Administered 2014-05-26 – 2014-05-27 (×2): 40 mg via ORAL
  Filled 2014-05-26 (×2): qty 1

## 2014-05-26 MED ORDER — LIDOCAINE HCL (CARDIAC) 20 MG/ML IV SOLN
INTRAVENOUS | Status: AC
  Filled 2014-05-26: qty 5

## 2014-05-26 MED ORDER — HYDROMORPHONE HCL 1 MG/ML IJ SOLN
0.2500 mg | INTRAMUSCULAR | Status: DC | PRN
  Administered 2014-05-26 (×2): 0.5 mg via INTRAVENOUS

## 2014-05-26 MED ORDER — NIACIN ER (ANTIHYPERLIPIDEMIC) 500 MG PO TBCR
500.0000 mg | EXTENDED_RELEASE_TABLET | Freq: Every day | ORAL | Status: DC
  Administered 2014-05-26: 500 mg via ORAL
  Filled 2014-05-26 (×2): qty 1

## 2014-05-26 MED ORDER — ONDANSETRON HCL 4 MG/2ML IJ SOLN
INTRAMUSCULAR | Status: DC | PRN
Start: 2014-05-26 — End: 2014-05-26
  Administered 2014-05-26: 4 mg via INTRAVENOUS

## 2014-05-26 MED ORDER — GLYCOPYRROLATE 0.2 MG/ML IJ SOLN
INTRAMUSCULAR | Status: AC
  Filled 2014-05-26: qty 4

## 2014-05-26 MED ORDER — ONDANSETRON HCL 4 MG PO TABS: 4.0000 mg | ORAL_TABLET | Freq: Four times a day (QID) | ORAL | Status: DC | PRN

## 2014-05-26 MED ORDER — ASPIRIN 325 MG PO TABS
325.0000 mg | ORAL_TABLET | Freq: Every day | ORAL | Status: DC
  Administered 2014-05-26 – 2014-05-27 (×2): 325 mg via ORAL
  Filled 2014-05-26 (×2): qty 1

## 2014-05-26 MED ORDER — ADULT MULTIVITAMIN W/MINERALS CH
1.0000 | ORAL_TABLET | Freq: Every day | ORAL | Status: DC
  Administered 2014-05-26 – 2014-05-27 (×2): 1 via ORAL
  Filled 2014-05-26 (×2): qty 1

## 2014-05-26 MED ORDER — CYCLOBENZAPRINE HCL 10 MG PO TABS: 10.0000 mg | ORAL_TABLET | Freq: Three times a day (TID) | ORAL | Status: DC | PRN

## 2014-05-26 MED ORDER — POTASSIUM CHLORIDE IN NACL 20-0.9 MEQ/L-% IV SOLN
INTRAVENOUS | Status: AC
  Administered 2014-05-26: 04:00:00 via INTRAVENOUS
  Filled 2014-05-26: qty 1000

## 2014-05-26 MED ORDER — CELECOXIB 200 MG PO CAPS: 200.0000 mg | ORAL_CAPSULE | Freq: Every day | ORAL | Status: DC | PRN

## 2014-05-26 MED ORDER — EZETIMIBE-SIMVASTATIN 10-20 MG PO TABS
1.0000 | ORAL_TABLET | Freq: Every day | ORAL | Status: DC
  Administered 2014-05-26: 1 via ORAL
  Filled 2014-05-26 (×2): qty 1

## 2014-05-26 MED ORDER — FOLIC ACID 0.5 MG HALF TAB
0.5000 mg | ORAL_TABLET | Freq: Every day | ORAL | Status: DC
  Administered 2014-05-26 – 2014-05-27 (×2): 0.5 mg via ORAL
  Filled 2014-05-26 (×3): qty 1

## 2014-05-26 MED ORDER — INSULIN ASPART 100 UNIT/ML ~~LOC~~ SOLN: 0.0000 [IU] | Freq: Three times a day (TID) | SUBCUTANEOUS | Status: DC

## 2014-05-26 MED ORDER — EPHEDRINE SULFATE 50 MG/ML IJ SOLN
INTRAMUSCULAR | Status: AC
  Filled 2014-05-26: qty 3

## 2014-05-26 MED ORDER — ROCURONIUM BROMIDE 50 MG/5ML IV SOLN
INTRAVENOUS | Status: AC
  Filled 2014-05-26: qty 2

## 2014-05-26 MED ORDER — ONDANSETRON HCL 4 MG/2ML IJ SOLN
INTRAMUSCULAR | Status: AC
  Filled 2014-05-26: qty 4

## 2014-05-26 MED ORDER — HYDROMORPHONE HCL 1 MG/ML IJ SOLN
INTRAMUSCULAR | Status: AC
  Filled 2014-05-26: qty 1

## 2014-05-26 NOTE — Transfer of Care (Signed)
Immediate Anesthesia Transfer of Care Note  Patient: [name] [name]  Procedure(s) Performed: Procedure(s): INTRAMEDULLARY (IM) NAIL FEMORAL WITH CABLES (Left)  Patient Location: PACU  Anesthesia Type:General  Level of Consciousness: responds to stimulation  Airway & Oxygen Therapy: Patient Spontanous Breathing and Patient connected to nasal cannula oxygen  Post-op Assessment: Report given to PACU RN and Post -op Vital signs reviewed and stable  Post vital signs: Reviewed and stable  Complications: No apparent anesthesia complications

## 2014-05-26 NOTE — Progress Notes (Signed)
Chaplain followed up with patient after on-call chaplain met with him in the ED. Patient identified strong family and faith community support. Chaplain offered prayer. Chaplain will follow up as needed.   05/26/14 1000  Clinical Encounter Type  Visited With Patient  Visit Type Follow-up  Referral From Chaplain  Spiritual Encounters  Spiritual Needs Emotional;Prayer  Stress Factors  Patient Stress Factors None identified;Family relationships  Rolly Salter, [place] 10:07 AM 05/26/2014

## 2014-05-26 NOTE — Op Note (Signed)
NAME:  [name] [name], [name] [name] NO.:  [id number]  MEDICAL RECORD NO.:  [id number]  LOCATION:  5N22C                        FACILITY:  [facility]  PHYSICIAN:  Anderson Malta, M.D.    DATE OF BIRTH:  02-Jul-1946  DATE OF PROCEDURE: DATE OF DISCHARGE:                              OPERATIVE REPORT   PREOPERATIVE DIAGNOSIS:  Left comminuted subtrochanteric femur fracture.  POSTOPERATIVE DIAGNOSIS:  Left comminuted subtrochanteric femur fracture.  PROCEDURE:  Open reduction and intramedullary nailing of left subtrochanteric femur fracture.  SURGEON:  Anderson Malta, M.D.  ASSISTANT:  Nehemiah Massed, PA.  ANESTHESIA:  General.  ESTIMATED BLOOD LOSS:  200 mL.  DRAINS:  None.  INDICATIONS:  [name] [name] is a patient with left femur fracture, presents for operative management after explanation of risks and benefits.  COMPONENTS UTILIZED:  Utilizing 2 Dall-Miles cables, 2 proximal interlocking screws placed in recon fashion, 2 distal interlocking screws.  PROCEDURE IN DETAIL:  The patient was brought to the operating room where general endotracheal anesthesia was induced.  Preoperative antibiotics were administered.  Time-out was called.  Right leg was placed in lithotomy position.  Peroneal nerve well padded.  Left leg was placed under traction and slight adduction.  Area was prescrubbed with alcohol and Bedtime, then allowed to air dry, prepped with DuraPrep solution and draped in sterile fashion.  An incision was made over the fracture site.  Skin and subcutaneous tissue were sharply divided. Fascia lata was divided.  Muscle vastus lateralis split on the posterior portion and elevated anteriorly.  Fracture site visualized.  Two cables placed with pretty reasonable reduction achieved.  These cables were placed with care being taken to avoid injury to both the femoral artery and the sciatic nerve.  At this time, the following cable placement and fracture reduction, separate incision  was made more proximal.  Guide pin placed in the tip of the trochanter and down into the femur.  Proximal reaming performed.  Guide pin was placed.  Femoral nail size measured. Reaming was performed at 11.5 mm and 10 mm nail was then placed with excellent fixation achieved with 2 proximal interlocking screws placed under fluoroscopic guidance in the AP and lateral planes.  These were placed in recon fashion in the inferior to middle portion of the femoral neck.  Two distal interlocking screws also placed under fluoro guidance in the AP and lateral planes with a good reduction achieved.  All incisions were thoroughly irrigated and closed using interrupted.  The nail size was 10 mm x 40 cm.  The incisions were then irrigated and closed using a combination of 0 Vicryl, 2-0 Vicryl, and skin staples.  Bulky wrap was applied.  The patient tolerated the procedure well without immediate complication.  Benjie Karvonen Roberts's assistance required at all times for retraction, neurovascular protection, reaming.  Her assistance was a medical necessity.     Anderson Malta, M.D.     GSD/MEDQ  D:  05/26/2014  T:  05/26/2014  Job:  [id number]

## 2014-05-26 NOTE — Brief Op Note (Signed)
05/25/2014 - 05/26/2014  1:41 AM  PATIENT:  [name] [name]  68 y.o. male  PRE-OPERATIVE DIAGNOSIS:  Fractured left femur  POST-OPERATIVE DIAGNOSIS:  Fractured left femur  PROCEDURE:  Procedure(s): INTRAMEDULLARY (IM) NAIL FEMORAL WITH CABLES  SURGEON:  Surgeon(s): Meredith Pel, MD  ASSISTANT: b roberts pa  ANESTHESIA:   general  EBL: 200 ml    Total I/O In: 2300 [I.V.:2300] Out: 700 [Urine:500; Blood:200]  BLOOD ADMINISTERED: none  DRAINS: none   LOCAL MEDICATIONS USED:  none  SPECIMEN:  No Specimen  COUNTS:  YES  TOURNIQUET:  * No tourniquets in log *  DICTATION: .Other Dictation: Dictation Number [id number]  PLAN OF CARE: Admit to inpatient   PATIENT DISPOSITION:  PACU - hemodynamically stable

## 2014-05-26 NOTE — Progress Notes (Signed)
Subjective: Pt stable - pain ok   Objective: Vital signs in last 24 hours: Temp:  [97.4 F (36.3 C)-98.4 F (36.9 C)] 97.6 F (36.4 C) (09/18 1122) Pulse Rate:  [59-90] 71 (09/18 1122) Resp:  [12-18] 18 (09/18 0802) BP: (79-130)/(60-81) 114/75 mmHg (09/18 1122) SpO2:  [95 %-100 %] 95 % (09/18 1122)  Intake/Output from previous day: 09/17 0701 - 09/18 0700 In: 2800 [I.V.:2800] Out: 875 [Urine:675; Blood:200] Intake/Output this shift: Total I/O In: -  Out: 650 [Urine:650]  Exam:  Neurovascular intact Sensation intact distally Intact pulses distally  Labs: No results found for this basename: HGB,  in the last 72 hours No results found for this basename: WBC, RBC, HCT, PLT,  in the last 72 hours  Recent Labs  05/25/14 2109 05/26/14 0517  NA 137 136*  K 4.4 4.7  CL 100 101  CO2 22 24  BUN 20 14  CREATININE 1.16 0.91  GLUCOSE 113* 124*  CALCIUM 9.0 8.0*    Recent Labs  05/25/14 2109  INR 1.06    Assessment/Plan: Plan dc home am - remove ace wrap before dc - rx on chart - tdwb LLe   Zhamir Pirro SCOTT 05/26/2014, 11:53 AM

## 2014-05-26 NOTE — Progress Notes (Signed)
Physical Therapy Treatment Patient Details Name: [name] [name] MRN: [id number] DOB: February 15, 1946 Today's Date: 05/26/2014    History of Present Illness 68 y.o. male sustained a fall from his room while painting resulting in a proximal femoral fracture. He is now s/p left IM nailing with femoral cables.    PT Comments    Patient is seen following the above procedure, presenting with functional limitations due to the deficits listed below (see PT Problem List). Therapy session limited due to hypotension with upright posture (see vitals). Pt wishes to return home for continued therapy. Anticipate he will progress well once BP is controlled. At this time recommend home with HHPT once patient mobilizing safely, however we will have a better idea for disposition tomorrow after pt is able to ambulate. Will update recommendation if necessary. Patient will benefit from skilled PT to increase their independence and safety with mobility.    Follow Up Recommendations  Home health PT;Supervision for mobility/OOB     Equipment Recommendations  Rolling walker with 5" wheels;3in1 (PT)    Recommendations for Other Services OT consult     Precautions / Restrictions Precautions Precautions: Fall Restrictions Weight Bearing Restrictions: Yes LLE Weight Bearing: Touchdown weight bearing    Mobility  Bed Mobility Overal bed mobility: Needs Assistance Bed Mobility: Supine to Sit     Supine to sit: Min assist     General bed mobility comments: Min assist for pulling trunk to seated position. Able to move LLE independently but very slowly. Dizziness upon sitting but resolved slightly. VC provided throughout task for technique  Transfers Overall transfer level: Needs assistance Equipment used: Rolling walker (2 wheeled) Transfers: Sit to/from [facility] Sit to Stand: Min assist;+2 physical assistance Stand pivot transfers: Min assist;+2 safety/equipment       General transfer  comment: Min assist +2 for boost to stand from lowest bed setting. VC for hand placement. Second person available for safety with pivot to chair. Increased symptoms of dizziness preventing further ambulation. Min assist for walker control and stability with pivot. He was able to maintain TDWB status without assist.  Ambulation/Gait                 Stairs            Wheelchair Mobility    Modified Rankin (Stroke Patients Only)       Balance Overall balance assessment: Needs assistance Sitting-balance support: No upper extremity supported;Feet supported Sitting balance-Leahy Scale: Good     Standing balance support: Bilateral upper extremity supported Standing balance-Leahy Scale: Poor                      Cognition Arousal/Alertness: Awake/alert Behavior During Therapy: WFL for tasks assessed/performed Overall Cognitive Status: Within Functional Limits for tasks assessed                      Exercises General Exercises - Lower Extremity Ankle Circles/Pumps: AROM;Both;10 reps;Supine Quad Sets: Strengthening;Both;10 reps;Supine Heel Slides: AAROM;Left;10 reps;Supine Hip ABduction/ADduction: AAROM;Left;10 reps;Supine    General Comments General comments (skin integrity, edema, etc.): BP dropped from 109/72 (supine) to 79/60 (after transfer in sitting and reporting dizziness), back to 108/64 in supine after transfer and resting in reclining chair with improved symptoms of dizziness.      Pertinent Vitals/Pain Pain Assessment: 0-10 Pain Score: 4  Pain Location: left thigh Pain Descriptors / Indicators: Constant Pain Intervention(s): Limited activity within patient's tolerance;Monitored during session;Repositioned;Premedicated before session  Home Living Family/patient expects to be discharged to:: Unsure Living Arrangements: Spouse/significant other Available Help at Discharge: Family;Available 24 hours/day Type of Home: House Home Access:  Level entry   Home Layout: Multi-level;Able to live on main level with bedroom/bathroom Home Equipment: Shower seat - built in      Prior Function Level of Independence: Independent          PT Goals (current goals can now be found in the care plan section) Acute Rehab PT Goals Patient Stated Goal: Go home PT Goal Formulation: With patient Time For Goal Achievement: 06/02/14 Potential to Achieve Goals: Good    Frequency  Min 5X/week    PT Plan      Co-evaluation             End of Session   Activity Tolerance: Treatment limited secondary to medical complications (Comment) (Limited by hypotension with upright positions) Patient left: in chair;with call bell/phone within reach     Time: 1027-1103 PT Time Calculation (min): 36 min  Charges:  $Therapeutic Exercise: 8-22 mins $Therapeutic Activity: 8-22 mins                    G Codes:      [employer], [place]  Ellouise Newer 05/26/2014, 1:07 PM

## 2014-05-26 NOTE — Progress Notes (Addendum)
Spoke with pt and wife about HHPT need.  They chose [facility] for agency. They have access to the 3-in-1 bedside commode from a family member so only needed the rolling walker ordered for discharge. Address and phone number listed in EPIC is correct.   UR completed.  Sandi Mariscal, RN BSN [place] CCM Trauma/Neuro ICU Case Manager [phone]

## 2014-05-26 NOTE — Anesthesia Postprocedure Evaluation (Signed)
  Anesthesia Post-op Note  Patient: [name] [name]  Procedure(s) Performed: Procedure(s): INTRAMEDULLARY (IM) NAIL FEMORAL WITH CABLES (Left)  Patient Location: PACU  Anesthesia Type:General  Level of Consciousness: awake, alert  and oriented  Airway and Oxygen Therapy: Patient Spontanous Breathing and Patient connected to nasal cannula oxygen  Post-op Pain: mild  Post-op Assessment: Post-op Vital signs reviewed, Patient's Cardiovascular Status Stable, Respiratory Function Stable, Patent Airway and No signs of Nausea or vomiting  Post-op Vital Signs: stable  Last Vitals:  Filed Vitals:   05/26/14 0252  BP: 111/62  Pulse: 72  Temp: 36.3 C  Resp: 16    Complications: No apparent anesthesia complications

## 2014-05-27 LAB — BASIC METABOLIC PANEL
Anion gap: 12 (ref 5–15)
BUN: 9 mg/dL (ref 6–23)
CO2: 24 mEq/L (ref 19–32)
Calcium: 8.5 mg/dL (ref 8.4–10.5)
Chloride: 99 mEq/L (ref 96–112)
Creatinine, Ser: 0.98 mg/dL (ref 0.50–1.35)
GFR calc Af Amer: >90 mL/min (ref >90)
GFR calc non Af Amer: 83 mL/min — ABNORMAL LOW (ref >90)
Glucose, Bld: 108 mg/dL — ABNORMAL HIGH (ref 70–99)
Potassium: 4.6 mEq/L (ref 3.7–5.3)
Sodium: 135 mEq/L — ABNORMAL LOW (ref 137–147)

## 2014-05-27 LAB — GLUCOSE, CAPILLARY: Glucose-Capillary: 121 mg/dL — ABNORMAL HIGH (ref 70–99)

## 2014-05-27 NOTE — Progress Notes (Signed)
Physical Therapy Treatment Patient Details Name: [name] [name] MRN: [id number] DOB: 1946/06/27 Today's Date: 05/27/2014    History of Present Illness 68 y.o. male sustained a fall from his roof while painting resulting in a proximal femoral fracture. He is now s/p left IM nailing with femoral cables.    PT Comments    Pt reports no dizziness when up today and feels ready for DC home. Will need HHPT to progress mobility and exercises.   Follow Up Recommendations  Home health PT;Supervision for mobility/OOB     Equipment Recommendations  Rolling walker with 5" wheels;3in1 (PT)    Recommendations for Other Services OT consult     Precautions / Restrictions Precautions Precautions: Fall Restrictions Weight Bearing Restrictions: Yes LLE Weight Bearing: Touchdown weight bearing    Mobility  Bed Mobility Overal bed mobility:  (NT, pt up in chair)                Transfers Overall transfer level: Needs assistance Equipment used: Rolling walker (2 wheeled) Transfers: Sit to/from Stand Sit to Stand: Min guard         General transfer comment: no dizziness today. Demonstrated safe technique.    Ambulation/Gait Ambulation/Gait assistance: Min guard Ambulation Distance (Feet): 16 Feet Assistive device: Rolling walker (2 wheeled)     Gait velocity interpretation: <1.8 ft/sec, indicative of risk for recurrent falls General Gait Details: Able to maintain TWB, gait very slow but no loss of balance.    Stairs            Wheelchair Mobility    Modified Rankin (Stroke Patients Only)       Balance   Sitting-balance support: Feet supported;Single extremity supported Sitting balance-Leahy Scale: Good     Standing balance support: Bilateral upper extremity supported                        Cognition Arousal/Alertness: Awake/alert Behavior During Therapy: WFL for tasks assessed/performed Overall Cognitive Status: Within Functional Limits for tasks  assessed                      Exercises General Exercises - Lower Extremity Ankle Circles/Pumps: AROM;Both;10 reps;Supine Quad Sets: AROM;Left;10 reps;Supine    General Comments General comments (skin integrity, edema, etc.): Pt feels like he is ready for DC.  Pt did not want to practie anything else during the session as his pain level was too high. Advised pt to call PT if his wife had questions when she arrived.       Pertinent Vitals/Pain Pain Assessment: 0-10 Pain Score: 7  Pain Location: left thigh and knee Pain Intervention(s): Monitored during session;RN gave pain meds during session    Home Living                      Prior Function            PT Goals (current goals can now be found in the care plan section) Progress towards PT goals: Progressing toward goals    Frequency  Min 5X/week    PT Plan Current plan remains appropriate    Co-evaluation             End of Session Equipment Utilized During Treatment: Gait belt Activity Tolerance: Patient limited by pain Patient left: in chair;with call bell/phone within reach     Time: 0947-0962 PT Time Calculation (min): 23 min  Charges:  $Gait Training: 23-37 mins  G Codes:      Melvern Banker May 29, 2014, 9:24 AM Lavonia Dana, PT  [phone] 29-May-2014

## 2014-05-27 NOTE — Progress Notes (Signed)
Patient provided with discharge instructions and follow up information. He has DME rolling walker for home. Had a resource at home for 3 in 1. Set up with HHPT. Going home at this time in private vehicle.

## 2014-05-29 ENCOUNTER — Encounter (HOSPITAL_COMMUNITY): Payer: Self-pay | Admitting: Orthopedic Surgery

## 2014-06-06 NOTE — Discharge Summary (Signed)
Physician Discharge Summary  Patient ID: [name] [name] MRN: [id number] DOB/AGE: Apr 18, 1946 68 y.o.  Admit date: 05/25/2014 Discharge date: 05/27/2014 Admission Diagnoses:  Active Problems:   Femur fracture   Discharge Diagnoses:  Same  Surgeries: Procedure(s): INTRAMEDULLARY (IM) NAIL FEMORAL WITH CABLES on 05/25/2014 - 05/26/2014   Consultants:    Discharged Condition: Stable  Hospital Course: [name] [name] is an 68 y.o. male who was admitted 05/25/2014 with a chief complaint of  Chief Complaint  Patient presents with  . Fall    Level II  . Leg Injury  , and found to have a diagnosis of femur fracture.  They were brought to the operating room on 05/25/2014 - 05/26/2014 and underwent the above named procedures.Mobilized with PT and dced home in good condition pwb    Antibiotics given:  Anti-infectives   Start     Dose/Rate Route Frequency Ordered Stop   05/26/14 0400  ceFAZolin (ANCEF) IVPB 1 g/50 mL premix     1 g 100 mL/hr over 30 Minutes Intravenous Every 6 hours 05/26/14 0252 05/26/14 1041    .  Recent vital signs:  Filed Vitals:   05/27/14 0545  BP: 113/69  Pulse: 109  Temp: 99.1 F (37.3 C)  Resp:     Recent laboratory studies:  Results for orders placed during the hospital encounter of 05/25/14  COMPREHENSIVE METABOLIC PANEL      Result Value Ref Range   Sodium 137  137 - 147 mEq/L   Potassium 4.4  3.7 - 5.3 mEq/L   Chloride 100  96 - 112 mEq/L   CO2 22  19 - 32 mEq/L   Glucose, Bld 113 (*) 70 - 99 mg/dL   BUN 20  6 - 23 mg/dL   Creatinine, Ser 1.16  0.50 - 1.35 mg/dL   Calcium 9.0  8.4 - 10.5 mg/dL   Total Protein 6.8  6.0 - 8.3 g/dL   Albumin 4.1  3.5 - 5.2 g/dL   AST 25  0 - 37 U/L   ALT 23  0 - 53 U/L   Alkaline Phosphatase 71  39 - 117 U/L   Total Bilirubin <0.2 (*) 0.3 - 1.2 mg/dL   GFR calc non Af Amer 63 (*) >90 mL/min   GFR calc Af Amer 73 (*) >90 mL/min   Anion gap 15  5 - 15  ETHANOL      Result Value Ref Range   Alcohol, Ethyl (B)  <11  0 - 11 mg/dL  PROTIME-INR      Result Value Ref Range   Prothrombin Time 13.8  11.6 - 15.2 seconds   INR 1.06  0.00 - 1.49  URINALYSIS, ROUTINE W REFLEX MICROSCOPIC      Result Value Ref Range   Color, Urine YELLOW  YELLOW   APPearance CLOUDY (*) CLEAR   Specific Gravity, Urine 1.019  1.005 - 1.030   pH 7.0  5.0 - 8.0   Glucose, UA NEGATIVE  NEGATIVE mg/dL   Hgb urine dipstick MODERATE (*) NEGATIVE   Bilirubin Urine NEGATIVE  NEGATIVE   Ketones, ur NEGATIVE  NEGATIVE mg/dL   Protein, ur NEGATIVE  NEGATIVE mg/dL   Urobilinogen, UA 0.2  0.0 - 1.0 mg/dL   Nitrite NEGATIVE  NEGATIVE   Leukocytes, UA LARGE (*) NEGATIVE  GLUCOSE, CAPILLARY      Result Value Ref Range   Glucose-Capillary 148 (*) 70 - 99 mg/dL   Comment 1 Notify RN    BASIC METABOLIC PANEL  Result Value Ref Range   Sodium 136 (*) 137 - 147 mEq/L   Potassium 4.7  3.7 - 5.3 mEq/L   Chloride 101  96 - 112 mEq/L   CO2 24  19 - 32 mEq/L   Glucose, Bld 124 (*) 70 - 99 mg/dL   BUN 14  6 - 23 mg/dL   Creatinine, Ser 0.91  0.50 - 1.35 mg/dL   Calcium 8.0 (*) 8.4 - 10.5 mg/dL   GFR calc non Af Amer 85 (*) >90 mL/min   GFR calc Af Amer >90  >90 mL/min   Anion gap 11  5 - 15  GLUCOSE, CAPILLARY      Result Value Ref Range   Glucose-Capillary 173 (*) 70 - 99 mg/dL  GLUCOSE, CAPILLARY      Result Value Ref Range   Glucose-Capillary 110 (*) 70 - 99 mg/dL  URINE MICROSCOPIC-ADD ON      Result Value Ref Range   WBC, UA 21-50  <3 WBC/hpf   RBC / HPF 7-10  <3 RBC/hpf   Bacteria, UA RARE  RARE  GLUCOSE, CAPILLARY      Result Value Ref Range   Glucose-Capillary 105 (*) 70 - 99 mg/dL  GLUCOSE, CAPILLARY      Result Value Ref Range   Glucose-Capillary 117 (*) 70 - 99 mg/dL  BASIC METABOLIC PANEL      Result Value Ref Range   Sodium 135 (*) 137 - 147 mEq/L   Potassium 4.6  3.7 - 5.3 mEq/L   Chloride 99  96 - 112 mEq/L   CO2 24  19 - 32 mEq/L   Glucose, Bld 108 (*) 70 - 99 mg/dL   BUN 9  6 - 23 mg/dL    Creatinine, Ser 0.98  0.50 - 1.35 mg/dL   Calcium 8.5  8.4 - 10.5 mg/dL   GFR calc non Af Amer 83 (*) >90 mL/min   GFR calc Af Amer >90  >90 mL/min   Anion gap 12  5 - 15  GLUCOSE, CAPILLARY      Result Value Ref Range   Glucose-Capillary 110 (*) 70 - 99 mg/dL   Comment 1 Notify RN    GLUCOSE, CAPILLARY      Result Value Ref Range   Glucose-Capillary 121 (*) 70 - 99 mg/dL   Comment 1 Notify RN    TYPE AND SCREEN      Result Value Ref Range   ABO/RH(D) O POS     Antibody Screen NEG     Sample Expiration 05/28/2014    ABO/RH      Result Value Ref Range   ABO/RH(D) O POS      Discharge Medications:     Medication List    STOP taking these medications       aspirin EC 81 MG tablet  Replaced by:  aspirin 325 MG tablet     naproxen sodium 220 MG tablet  Commonly known as:  ANAPROX     traMADol 50 MG tablet  Commonly known as:  ULTRAM      TAKE these medications       amLODipine 5 MG tablet  Commonly known as:  NORVASC  Take 1 tablet (5 mg total) by mouth daily.     aspirin 325 MG tablet  Take 1 tablet (325 mg total) by mouth daily.     celecoxib 200 MG capsule  Commonly known as:  CELEBREX  Take 200 mg by mouth daily as needed for mild  pain.     cyclobenzaprine 10 MG tablet  Commonly known as:  FLEXERIL  Take 10 mg by mouth 3 (three) times daily as needed for muscle spasms.     ezetimibe-simvastatin 10-20 MG per tablet  Commonly known as:  VYTORIN  Take 1 tablet by mouth at bedtime.     fexofenadine 180 MG tablet  Commonly known as:  ALLEGRA  Take 180 mg by mouth daily.     folic acid 035 MCG tablet  Commonly known as:  FOLVITE  Take 400 mcg by mouth daily.     hydrochlorothiazide 25 MG tablet  Commonly known as:  HYDRODIURIL  Take 25 mg by mouth daily as needed (for feet / ankle swelling).     levothyroxine 137 MCG tablet  Commonly known as:  SYNTHROID, LEVOTHROID  Take 1 tablet (137 mcg total) by mouth daily before breakfast.     metFORMIN 500  MG 24 hr tablet  Commonly known as:  GLUCOPHAGE-XR  Take 3 tablets (1,500 mg total) by mouth at bedtime.     multivitamin per tablet  Take 1 tablet by mouth daily.     NEXIUM 24HR 20 MG capsule  Generic drug:  esomeprazole  Take 20 mg by mouth daily at 12 noon.     niacin 500 MG CR tablet  Commonly known as:  NIASPAN  Take 1 tablet (500 mg total) by mouth at bedtime.     oxyCODONE 5 MG immediate release tablet  Commonly known as:  Oxy IR/ROXICODONE  Take 1-2 tablets (5-10 mg total) by mouth every 3 (three) hours as needed for breakthrough pain.     ramipril 10 MG capsule  Commonly known as:  ALTACE  Take 1 capsule (10 mg total) by mouth daily.        Diagnostic Studies: Dg Femur Left  05/26/2014   CLINICAL DATA:  Intraoperative fixation, left femur fracture  EXAM: DG C-ARM 61-120 MIN; LEFT FEMUR - 2 VIEW  COMPARISON:  Preoperative imaging 05/25/2014  FINDINGS: Six intraoperative fluoroscopic images demonstrate dynamic intra medullary rod fixation of the previously seen oblique proximal left femoral fracture. Fracture fragments are in near anatomic alignment. No evidence for hardware failure.  IMPRESSION: Expected appearance of the intraoperative ORIF of left femoral fracture.   Electronically Signed   By: Conchita Paris M.D.   On: 05/26/2014 07:17   Ct Head Wo Contrast  05/25/2014   CLINICAL DATA:  68 year old male with 8 foot fall from ladder with head and neck injury.  EXAM: CT HEAD WITHOUT CONTRAST  CT CERVICAL SPINE WITHOUT CONTRAST  TECHNIQUE: Multidetector CT imaging of the head and cervical spine was performed following the standard protocol without intravenous contrast. Multiplanar CT image reconstructions of the cervical spine were also generated.  COMPARISON:  01/02/2009 CT is  FINDINGS: CT HEAD FINDINGS  No intracranial abnormalities are identified, including mass lesion or mass effect, hydrocephalus, extra-axial fluid collection, midline shift, hemorrhage, or acute  infarction.  The visualized bony calvarium is unremarkable.  CT CERVICAL SPINE FINDINGS  Normal alignment is noted.  There is no evidence of acute fracture, subluxation or prevertebral soft tissue swelling.  Mild to moderate degenerative disc disease and spondylosis from C3-C7 noted.  These findings contribute to mild to moderate bony foraminal narrowing at several levels, greatest on the right at C4-5 and C5-6. No focal bony lesions are present.  The soft tissue structures are unremarkable.  IMPRESSION: Unremarkable noncontrast head CT.  No static evidence of acute injury to the  cervical spine.  Mild to moderate multilevel degenerative changes of the cervical spine contributing to bony foraminal narrowing at several levels, greatest on the right at C4-5 and C5-6.   Electronically Signed   By: Hassan Rowan M.D.   On: 05/25/2014 22:29   Ct Cervical Spine Wo Contrast  05/25/2014   CLINICAL DATA:  68 year old male with 8 foot fall from ladder with head and neck injury.  EXAM: CT HEAD WITHOUT CONTRAST  CT CERVICAL SPINE WITHOUT CONTRAST  TECHNIQUE: Multidetector CT imaging of the head and cervical spine was performed following the standard protocol without intravenous contrast. Multiplanar CT image reconstructions of the cervical spine were also generated.  COMPARISON:  01/02/2009 CT is  FINDINGS: CT HEAD FINDINGS  No intracranial abnormalities are identified, including mass lesion or mass effect, hydrocephalus, extra-axial fluid collection, midline shift, hemorrhage, or acute infarction.  The visualized bony calvarium is unremarkable.  CT CERVICAL SPINE FINDINGS  Normal alignment is noted.  There is no evidence of acute fracture, subluxation or prevertebral soft tissue swelling.  Mild to moderate degenerative disc disease and spondylosis from C3-C7 noted.  These findings contribute to mild to moderate bony foraminal narrowing at several levels, greatest on the right at C4-5 and C5-6. No focal bony lesions are present.   The soft tissue structures are unremarkable.  IMPRESSION: Unremarkable noncontrast head CT.  No static evidence of acute injury to the cervical spine.  Mild to moderate multilevel degenerative changes of the cervical spine contributing to bony foraminal narrowing at several levels, greatest on the right at C4-5 and C5-6.   Electronically Signed   By: Hassan Rowan M.D.   On: 05/25/2014 22:29   Dg Pelvis Portable  05/25/2014   CLINICAL DATA:  Fall, trauma  EXAM: PORTABLE PELVIS 1-2 VIEWS  COMPARISON:  None.  FINDINGS: There is a comminuted subtrochanteric fracture of the proximal left femoral shaft. The left femoral head remains normally position within the acetabulum. Femoral head is intact. There appears to be extension into the intertrochanteric region.  Bony pelvis is intact. No pubic diastasis. SI joints are approximated. Right hip is intact.  Degenerative changes and within the lower lumbar spine.  IMPRESSION: Acute comminuted fracture of the proximal left femoral shaft with intertrochanteric extension.   Electronically Signed   By: Jeannine Boga M.D.   On: 05/25/2014 21:57   Dg Chest Portable 1 View  05/25/2014   CLINICAL DATA:  Fall with injury and pain.  EXAM: PORTABLE CHEST - 1 VIEW  COMPARISON:  None.  FINDINGS: The cardiomediastinal silhouette is unremarkable.  CABG changes are identified.  Elevation of the right hemidiaphragm may be chronic.  A remote appearing fracture of the left seventh rib is noted.  There is no evidence of focal airspace disease, pulmonary edema, suspicious pulmonary nodule/mass, pleural effusion, or pneumothorax.  No acute bony abnormalities are identified.  IMPRESSION: Elevation of the right hemidiaphragm which may be chronic.  No other significant abnormalities identified.   Electronically Signed   By: Hassan Rowan M.D.   On: 05/25/2014 22:01   Dg Femur Left Port  05/26/2014   CLINICAL DATA:  Post ORIF, left femur fracture  EXAM: PORTABLE LEFT FEMUR - 2 VIEW  COMPARISON:   Intraoperative imaging same day  FINDINGS: Soft tissue gas and skin staples are noted associated with expected postoperative appearance of left femur ORIF with dynamic nail fixation. Fracture fragments are in near anatomic alignment. No evidence for hardware failure.  IMPRESSION: Expected postoperative appearance after left  femur ORIF.   Electronically Signed   By: Conchita Paris M.D.   On: 05/26/2014 07:29   Dg Femur Left Port  05/25/2014   CLINICAL DATA:  Status post fall approximately 8 feet.  EXAM: PORTABLE LEFT FEMUR - 2 VIEW  COMPARISON:  None.  FINDINGS: There is displaced fracture of the proximal left femoral shaft extending into the medial inferior trochanteric region. There is no dislocation.  IMPRESSION: Displaced fracture of the proximal femur.   Electronically Signed   By: Abelardo Diesel M.D.   On: 05/25/2014 21:50   Dg C-arm 61-120 Min  05/26/2014   CLINICAL DATA:  Intraoperative fixation, left femur fracture  EXAM: DG C-ARM 61-120 MIN; LEFT FEMUR - 2 VIEW  COMPARISON:  Preoperative imaging 05/25/2014  FINDINGS: Six intraoperative fluoroscopic images demonstrate dynamic intra medullary rod fixation of the previously seen oblique proximal left femoral fracture. Fracture fragments are in near anatomic alignment. No evidence for hardware failure.  IMPRESSION: Expected appearance of the intraoperative ORIF of left femoral fracture.   Electronically Signed   By: Conchita Paris M.D.   On: 05/26/2014 07:17    Disposition: 06-Home-Health Care Svc      Discharge Instructions   Call MD / Call 911    Complete by:  As directed   If you experience chest pain or shortness of breath, CALL 911 and be transported to the hospital emergency room.  If you develope a fever above 101 F, pus (white drainage) or increased drainage or redness at the wound, or calf pain, call your surgeon's office.     Change dressing    Complete by:  As directed   Please remove ace wrap only Saturday thx     Constipation  Prevention    Complete by:  As directed   Drink plenty of fluids.  Prune juice may be helpful.  You may use a stool softener, such as Colace (over the counter) 100 mg twice a day.  Use MiraLax (over the counter) for constipation as needed.     Diet - low sodium heart healthy    Complete by:  As directed      Discharge instructions    Complete by:  As directed   Touch down weight bearing only left leg Keep incisions dry Ok for knee range of motion     Increase activity slowly as tolerated    Complete by:  As directed            Follow-up Information   Follow up with [facility]. (Physical Therapist will call you to arrange the visits)    Contact information:   [address] [place] [address] [phone]        Signed: Meredith Pel 06/06/2014, 10:31 AM

## 2014-07-10 ENCOUNTER — Ambulatory Visit (INDEPENDENT_AMBULATORY_CARE_PROVIDER_SITE_OTHER): Payer: Medicare Other | Admitting: Endocrinology

## 2014-07-10 ENCOUNTER — Encounter: Payer: Self-pay | Admitting: Endocrinology

## 2014-07-10 ENCOUNTER — Other Ambulatory Visit: Payer: Medicare Other

## 2014-07-10 VITALS — BP 118/72 | HR 80 | Temp 98.0°F | Resp 14 | Ht 68.0 in | Wt 154.2 lb

## 2014-07-10 DIAGNOSIS — Z23 Encounter for immunization: Secondary | ICD-10-CM

## 2014-07-10 DIAGNOSIS — E785 Hyperlipidemia, unspecified: Secondary | ICD-10-CM

## 2014-07-10 DIAGNOSIS — E038 Other specified hypothyroidism: Secondary | ICD-10-CM

## 2014-07-10 DIAGNOSIS — E119 Type 2 diabetes mellitus without complications: Secondary | ICD-10-CM

## 2014-07-10 DIAGNOSIS — E063 Autoimmune thyroiditis: Secondary | ICD-10-CM

## 2014-07-10 MED ORDER — LEVOTHYROXINE SODIUM 125 MCG PO TABS: 125.0000 ug | ORAL_TABLET | Freq: Every day | ORAL | Status: DC

## 2014-07-10 NOTE — Patient Instructions (Signed)
Reduce Synthroid

## 2014-07-10 NOTE — Progress Notes (Signed)
Patient ID: [name] [name], male   DOB: 10-Jun-1946, 68 y.o.   MRN: [id number]    Reason for Appointment: Diabetes follow-up   History of Present Illness   Diagnosis: Type 2 DIABETES MELITUS, date of diagnosis: 2009     Previous history: He has been treated with metformin monotherapy for several years with stable control He has generally been able to keep his weight down and watch his diet Unable to exercise much because of chronic back pain  Recent history:    He was seen as usual 6 months ago and his blood sugars at home are recently good Couple of weeks ago he had some higher readings from being on medication and not eating right However has not been able to move around much because of recent leg fracture Also has lost weight from his recent hospitalization In 9/15 he had an upper normal A1c He is checking his blood sugars at various times including after meals and highest reading is only 152 after lunch Recently going back on diet and doing well  Oral hypoglycemic drugs:   metformin       Side effects from medications: None       Monitors blood glucose: Once a day.    Glucometer:  FreeStyle        Blood Glucose readings from meter download:   PREMEAL Breakfast PC Lunch 7 PM Bedtime Overall  Glucose range: 99-141 101-152 89-126  89-152  Mean/median:     113    Hypoglycemia frequency:  none         Meals: 3 meals per day.          Physical activity: minimal, walking with a cane         Dietician visit: Most recent: 11/2008    Weight control:  Wt Readings from Last 3 Encounters:  07/10/14 154 lb 3.2 oz (69.945 kg)  01/06/14 161 lb (73.029 kg)  09/21/13 160 lb 3.2 oz (72.666 kg)        Eye exam has been done annually Complications: None, has no symptoms of neuropathy  Diabetes labs:  Lab Results  Component Value Date   HGBA1C 6.0 05/17/2014   HGBA1C 5.9 03/08/2014   HGBA1C 5.7 01/06/2014   Lab Results  Component Value Date   MICROALBUR 0.1  07/26/2013   LDLCALC 32 05/17/2014   CREATININE 0.98 05/27/2014         Medication List       This list is accurate as of: 07/10/14  8:40 AM.  Always use your most recent med list.               amLODipine 5 MG tablet  Commonly known as:  NORVASC  Take 1 tablet (5 mg total) by mouth daily.     aspirin 325 MG tablet  Take 1 tablet (325 mg total) by mouth daily.     celecoxib 200 MG capsule  Commonly known as:  CELEBREX  Take 200 mg by mouth daily as needed for mild pain.     cyclobenzaprine 10 MG tablet  Commonly known as:  FLEXERIL  Take 10 mg by mouth 3 (three) times daily as needed for muscle spasms.     ezetimibe-simvastatin 10-20 MG per tablet  Commonly known as:  VYTORIN  Take 1 tablet by mouth at bedtime.     fexofenadine 180 MG tablet  Commonly known as:  ALLEGRA  Take 180 mg by mouth daily.  folic acid 983 MCG tablet  Commonly known as:  FOLVITE  Take 400 mcg by mouth daily.     hydrochlorothiazide 25 MG tablet  Commonly known as:  HYDRODIURIL  Take 25 mg by mouth daily as needed (for feet / ankle swelling).     levothyroxine 137 MCG tablet  Commonly known as:  SYNTHROID, LEVOTHROID  Take 1 tablet (137 mcg total) by mouth daily before breakfast.     metFORMIN 500 MG 24 hr tablet  Commonly known as:  GLUCOPHAGE-XR  Take 3 tablets (1,500 mg total) by mouth at bedtime.     multivitamin per tablet  Take 1 tablet by mouth daily.     NEXIUM 24HR 20 MG capsule  Generic drug:  esomeprazole  Take 20 mg by mouth daily at 12 noon.     niacin 500 MG CR tablet  Commonly known as:  NIASPAN  Take 1 tablet (500 mg total) by mouth at bedtime.     oxyCODONE 5 MG immediate release tablet  Commonly known as:  Oxy IR/ROXICODONE  Take 1-2 tablets (5-10 mg total) by mouth every 3 (three) hours as needed for breakthrough pain.     ramipril 10 MG capsule  Commonly known as:  ALTACE  Take 1 capsule (10 mg total) by mouth daily.     temazepam 15 MG capsule    Commonly known as:  RESTORIL        Allergies: No Known Allergies  Past Medical History  Diagnosis Date  . Inguinal hernia   . AMI (acute mesenteric ischemia)   . HTN (hypertension)   . Hypothyroidism   . Colon polyps   . Hemorrhoids   . GERD (gastroesophageal reflux disease)   . Diverticulosis   . Rectal bleeding   . Heart attack 2004  . Arthritis, lumbar spine   . Lumbar scoliosis   . Diabetes mellitus without complication   . Allergy   . Cataract     Past Surgical History  Procedure Laterality Date  . Appendectomy  1966  . Coronary artery bypass graft  2004    x 5 / one stent  . Coronary angioplasty with stent placement    . Inguinal hernia repair  1994    right side  . By-pass    . Lumbar laminotomy  10/2009  . Colonoscopy    . Polypectomy    . Cataract extraction      Bil  /   11/2012  . Femur im nail Left 05/25/2014    Procedure: INTRAMEDULLARY (IM) NAIL FEMORAL WITH CABLES;  Surgeon: Meredith Pel, MD;  Location: [facility];  Service: Orthopedics;  Laterality: Left;    Family History  Problem Relation Age of Onset  . Heart disease Father   . Colon cancer Cousin     Social History:  reports that he quit smoking about 40 years ago. His smoking use included Cigarettes. He has a 10 pack-year smoking history. He has never used smokeless tobacco. He reports that he does not drink alcohol or use illicit drugs.  Review of Systems:  Lipids: LDL at target, HDL low normal  Lab Results  Component Value Date   CHOL 93 05/17/2014   HDL 44.80 05/17/2014   LDLCALC 32 05/17/2014   TRIG 79.0 05/17/2014   CHOLHDL 2 05/17/2014     HYPOTHYROIDISM: This is long-standing and TSH has been relatively lower, despite reducing his dose in 5/15 his TSH has again become lower in 9/15 Currently taking 137 g  He is compliant in the morning daily  with this daily   Lab Results  Component Value Date   FREET4 1.56 05/17/2014   FREET4 1.19 03/08/2014   FREET4 1.53  01/06/2014   TSH 0.12* 05/17/2014   TSH 1.02 03/08/2014   TSH 0.12* 01/06/2014    Hypertension:  this is mild and well controlled.  Previously on HCTZ   Foot exam done in 11/14   Examination:   BP 118/72 mmHg  Pulse 80  Temp(Src) 98 F (36.7 C)  Resp 14  Ht 5\' 8"  (1.727 m)  Wt 154 lb 3.2 oz (69.945 kg)  BMI 23.45 kg/m2  SpO2 96%  Body mass index is 23.45 kg/(m^2).   No ankle edema  ASSESSMENT/ PLAN:   Diabetes type 2:  Blood glucose control is good with recently fairly normal blood sugars at home including postprandials with metformin alone He is quite compliant with his diet especially recently He has limited ability to exercise  A1c to be checked on his next visit To continue same dose of metformin  No history of diabetic complications and has had normal urine microalbumin    HYPERLIPIDEMIA: Currently being treated with niacin and Vytorin because of history of CAD  Hypothyroidism:  His TSH has been relatively lower and his dose will need to be reduced to 125 g He will have followup TSH on his next visit  Influenza vaccine given  [facility] 07/10/2014, 8:40 AM

## 2014-08-07 ENCOUNTER — Other Ambulatory Visit: Payer: Self-pay | Admitting: *Deleted

## 2014-08-07 MED ORDER — AMLODIPINE BESYLATE 5 MG PO TABS: 5.0000 mg | ORAL_TABLET | Freq: Every day | ORAL | Status: DC

## 2014-09-11 ENCOUNTER — Encounter: Payer: Self-pay | Admitting: Endocrinology

## 2014-09-13 ENCOUNTER — Ambulatory Visit: Payer: Medicare Other | Admitting: Endocrinology

## 2014-09-15 DIAGNOSIS — E119 Type 2 diabetes mellitus without complications: Secondary | ICD-10-CM | POA: Diagnosis not present

## 2014-09-21 ENCOUNTER — Other Ambulatory Visit: Payer: Self-pay | Admitting: *Deleted

## 2014-09-21 MED ORDER — AMLODIPINE BESYLATE 5 MG PO TABS: 5.0000 mg | ORAL_TABLET | Freq: Every day | ORAL | Status: DC

## 2014-09-21 MED ORDER — RAMIPRIL 10 MG PO CAPS: 10.0000 mg | ORAL_CAPSULE | Freq: Every day | ORAL | Status: DC

## 2014-10-05 ENCOUNTER — Ambulatory Visit (INDEPENDENT_AMBULATORY_CARE_PROVIDER_SITE_OTHER): Payer: Medicare Other | Admitting: Cardiovascular Disease

## 2014-10-05 ENCOUNTER — Encounter: Payer: Self-pay | Admitting: Cardiovascular Disease

## 2014-10-05 ENCOUNTER — Other Ambulatory Visit: Payer: Self-pay | Admitting: Cardiovascular Disease

## 2014-10-05 VITALS — BP 115/78 | HR 76 | Ht 68.0 in | Wt 158.0 lb

## 2014-10-05 DIAGNOSIS — E785 Hyperlipidemia, unspecified: Secondary | ICD-10-CM | POA: Diagnosis not present

## 2014-10-05 DIAGNOSIS — I2510 Atherosclerotic heart disease of native coronary artery without angina pectoris: Secondary | ICD-10-CM

## 2014-10-05 MED ORDER — ATORVASTATIN CALCIUM 20 MG PO TABS: 20.0000 mg | ORAL_TABLET | Freq: Every day | ORAL | Status: DC

## 2014-10-05 MED ORDER — AMLODIPINE BESYLATE 5 MG PO TABS: 5.0000 mg | ORAL_TABLET | Freq: Every day | ORAL | Status: DC

## 2014-10-05 MED ORDER — NIACIN 500 MG PO TABS: 500.0000 mg | ORAL_TABLET | Freq: Every day | ORAL | Status: DC

## 2014-10-05 MED ORDER — RAMIPRIL 10 MG PO CAPS: 10.0000 mg | ORAL_CAPSULE | Freq: Every day | ORAL | Status: DC

## 2014-10-05 NOTE — Patient Instructions (Addendum)
Your physician has recommended you make the following change in your medication:  1. STOP Niacin CR 2. START Niacin 500mg  once a day 3. STOP Vytorin 4. START Atorvastatin 20mg  take one by mouth every evening  Your physician wants you to follow-up in: 1 YEAR with Dr Burt Knack.  You will receive a reminder letter in the mail two months in advance. If you don't receive a letter, please call our office to schedule the follow-up appointment.

## 2014-10-05 NOTE — Progress Notes (Signed)
Cardiology Office Note   Date:  10/05/2014   ID:  [name], [name] [birth year], MRN [id number]  PCP:  Angelina Sheriff., MD  Cardiologist:  Sherren Mocha, MD    Chief Complaint  Patient presents with  . Follow-up    rx refills     History of Present Illness: [name] [name] is a 69 y.o. male who presents for follow-up of CAD.  The patient has coronary artery disease, initially presenting in 2004 with a myocardial infarction treated initially with PCI and ultimately with multivessel CABG. Last nuclear scan was in 2012 demonstrating no ischemia. His risk factors of hyperlipidemia, hypertension, and type 2 diabetes have all been well-controlled.   He fell off of a barn roof in September 2015 and sustained a left femur fracture requiring surgery. His recovery has been difficulty. Just started walking without a cane last week. He did not have any cardiac problems around the time of his surgery. He denies chest pain, shortness of breath, edema, orthopnea, PND, or palpitations.  Past Medical History  Diagnosis Date  . Inguinal hernia   . AMI (acute mesenteric ischemia)   . HTN (hypertension)   . Hypothyroidism   . Colon polyps   . Hemorrhoids   . GERD (gastroesophageal reflux disease)   . Diverticulosis   . Rectal bleeding   . Heart attack 2004  . Arthritis, lumbar spine   . Lumbar scoliosis   . Diabetes mellitus without complication   . Allergy   . Cataract     Past Surgical History  Procedure Laterality Date  . Appendectomy  1966  . Coronary artery bypass graft  2004    x 5 / one stent  . Coronary angioplasty with stent placement    . Inguinal hernia repair  1994    right side  . By-pass    . Lumbar laminotomy  10/2009  . Colonoscopy    . Polypectomy    . Cataract extraction      Bil  /   11/2012  . Femur im nail Left 05/25/2014    Procedure: INTRAMEDULLARY (IM) NAIL FEMORAL WITH CABLES;  Surgeon: Meredith Pel, MD;  Location: [facility];  Service: Orthopedics;   Laterality: Left;    Current Outpatient Prescriptions  Medication Sig Dispense Refill  . amLODipine (NORVASC) 5 MG tablet Take 1 tablet (5 mg total) by mouth daily. 90 tablet 3  . aspirin 325 MG tablet Take 1 tablet (325 mg total) by mouth daily. 30 tablet 0  . esomeprazole (NEXIUM 24HR) 20 MG capsule Take 20 mg by mouth daily at 12 noon.    . fexofenadine (ALLEGRA) 180 MG tablet Take 180 mg by mouth daily.      . folic acid (FOLVITE) 734 MCG tablet Take 400 mcg by mouth daily.    Marland Kitchen levothyroxine (SYNTHROID, LEVOTHROID) 125 MCG tablet Take 1 tablet (125 mcg total) by mouth daily. 90 tablet 3  . metFORMIN (GLUCOPHAGE-XR) 500 MG 24 hr tablet Take 3 tablets (1,500 mg total) by mouth at bedtime. 270 tablet 1  . multivitamin (THERAGRAN) per tablet Take 1 tablet by mouth daily.      . ramipril (ALTACE) 10 MG capsule Take 1 capsule (10 mg total) by mouth daily. 90 capsule 3  . traMADol (ULTRAM) 50 MG tablet Take 50 mg by mouth every 6 (six) hours as needed for severe pain (leg pain).    Marland Kitchen atorvastatin (LIPITOR) 20 MG tablet Take 1 tablet (20 mg total) by mouth  daily. 90 tablet 3  . niacin 500 MG tablet Take 1 tablet (500 mg total) by mouth at bedtime. 90 tablet 3   No current facility-administered medications for this visit.    Allergies:   Review of patient's allergies indicates no known allergies.   Social History:  The patient  reports that he quit smoking about 41 years ago. His smoking use included Cigarettes. He has a 10 pack-year smoking history. He has never used smokeless tobacco. He reports that he does not drink alcohol or use illicit drugs.   Family History:  The patient's family history includes Colon cancer in his cousin; Heart disease in his father.    ROS:  Please see the history of present illness.  Otherwise, review of systems is positive for back pain.  All other systems are reviewed and negative.   PHYSICAL EXAM: VS:  BP 115/78 mmHg  Pulse 76  Ht 5\' 8"  (1.727 m)  Wt  158 lb (71.668 kg)  BMI 24.03 kg/m2 , BMI Body mass index is 24.03 kg/(m^2). GEN: Well nourished, well developed, in no acute distress HEENT: normal Neck: no JVD, carotid bruits, or masses Cardiac: RRR without murmur or gallop                No peripheral edema Respiratory:  clear to auscultation bilaterally, normal work of breathing GI: soft, nontender, nondistended, + BS MS: no deformity or atrophy Skin: warm and dry, no rash Neuro:  Strength and sensation are intact Psych: euthymic mood, full affect  EKG:  EKG is ordered today. The ekg ordered today shows NSR with PAC's 76 bpm, within normal limits otherwise  Recent Labs: 05/17/2014: TSH 0.12* 05/25/2014: ALT 23 05/27/2014: BUN 9; Creatinine 0.98; Potassium 4.6; Sodium 135*   Lipid Panel     Component Value Date/Time   CHOL 93 05/17/2014 0757   TRIG 79.0 05/17/2014 0757   HDL 44.80 05/17/2014 0757   CHOLHDL 2 05/17/2014 0757   VLDL 15.8 05/17/2014 0757   LDLCALC 32 05/17/2014 0757      Wt Readings from Last 3 Encounters:  10/05/14 158 lb (71.668 kg)  07/10/14 154 lb 3.2 oz (69.945 kg)  01/06/14 161 lb (73.029 kg)    ASSESSMENT AND PLAN: 1.  CAD, native vessel, without symptoms of angina: the patient is stable from a cardiac perspective. He's been through a major stress of a femur fracture and surgery without cardiac problems. I will see him back in one year.  2. Hyperlipidemia: Last lipids reviewed as above. The patient is at goal on his current medical program with multidrug lipid-lowering therapy. LFTs remain within normal limits. He requests medication changes to reduce cost. Will change to short-acting Niacin 500 mg daily. Instructions about flushing were given. Also will stop his Vytorin and change to atorvastatin 20 mg. Follow-up labs already planned with Dr Dwyane Dee in May.  3. Essential hypertension: Blood pressure is controlled with ramipril and amlodipine. Most recent laboratory data reviewed and renal function  remains normal.  Current medicines are reviewed with the patient today.  The patient does not have concerns regarding medicines.  The following changes have been made:  See above  Labs/ tests ordered today include: none   Orders Placed This Encounter  Procedures  . EKG 12-Lead   Disposition:   FU with me in 12 months.  Signed, Sherren Mocha, MD  10/05/2014 11:27 AM    [address], [place], [place]  [address] Phone: [phone];  Fax: [phone]

## 2014-10-16 ENCOUNTER — Other Ambulatory Visit: Payer: Self-pay | Admitting: *Deleted

## 2014-10-16 ENCOUNTER — Encounter: Payer: Self-pay | Admitting: Endocrinology

## 2014-10-16 MED ORDER — LEVOTHYROXINE SODIUM 125 MCG PO TABS: 125.0000 ug | ORAL_TABLET | Freq: Every day | ORAL | Status: DC

## 2014-10-17 ENCOUNTER — Telehealth: Payer: Self-pay | Admitting: Cardiovascular Disease

## 2014-10-17 NOTE — Telephone Encounter (Signed)
New message      Need clarification on niacin.  Reference # [id number].  (No one is in the refill dept)

## 2014-10-17 NOTE — Telephone Encounter (Signed)
I spoke with the pharmacist at [facility] and they wanted to clarify Niacin order (OTC versus Niacin ER).  Dr Burt Knack changed the pt to OTC Niacin due to the cost of extended release.  I contacted the pt to let him know that he needs to get this medication OTC.

## 2014-10-24 NOTE — Telephone Encounter (Signed)
error 

## 2014-11-23 ENCOUNTER — Encounter: Payer: Self-pay | Admitting: Endocrinology

## 2014-11-23 ENCOUNTER — Other Ambulatory Visit: Payer: Self-pay

## 2014-11-23 MED ORDER — METFORMIN HCL ER 500 MG PO TB24: 1500.0000 mg | ORAL_TABLET | Freq: Every day | ORAL | Status: DC

## 2015-01-04 ENCOUNTER — Ambulatory Visit (INDEPENDENT_AMBULATORY_CARE_PROVIDER_SITE_OTHER): Payer: Medicare Other | Admitting: Endocrinology

## 2015-01-04 ENCOUNTER — Other Ambulatory Visit: Payer: Medicare Other

## 2015-01-04 ENCOUNTER — Encounter: Payer: Self-pay | Admitting: Endocrinology

## 2015-01-04 ENCOUNTER — Other Ambulatory Visit: Payer: Self-pay | Admitting: *Deleted

## 2015-01-04 ENCOUNTER — Other Ambulatory Visit (INDEPENDENT_AMBULATORY_CARE_PROVIDER_SITE_OTHER): Payer: Medicare Other

## 2015-01-04 VITALS — BP 116/72 | HR 98 | Temp 97.7°F | Resp 14 | Ht 68.0 in | Wt 155.6 lb

## 2015-01-04 DIAGNOSIS — E119 Type 2 diabetes mellitus without complications: Secondary | ICD-10-CM

## 2015-01-04 DIAGNOSIS — E063 Autoimmune thyroiditis: Secondary | ICD-10-CM

## 2015-01-04 DIAGNOSIS — E038 Other specified hypothyroidism: Secondary | ICD-10-CM

## 2015-01-04 DIAGNOSIS — E785 Hyperlipidemia, unspecified: Secondary | ICD-10-CM

## 2015-01-04 LAB — HEMOGLOBIN A1C: Hgb A1c MFr Bld: 5.7 % (ref 4.6–6.5)

## 2015-01-04 LAB — BASIC METABOLIC PANEL
BUN: 16 mg/dL (ref 6–23)
CO2: 21 mEq/L (ref 19–32)
Calcium: 10 mg/dL (ref 8.4–10.5)
Chloride: 103 mEq/L (ref 96–112)
Creatinine, Ser: 1.09 mg/dL (ref 0.40–1.50)
GFR: 71.27 mL/min (ref >60.00)
Glucose, Bld: 94 mg/dL (ref 70–99)
Potassium: 4.3 mEq/L (ref 3.5–5.1)
Sodium: 136 mEq/L (ref 135–145)

## 2015-01-04 LAB — POCT URINALYSIS DIPSTICK
Bilirubin, UA: NEGATIVE
Blood, UA: NEGATIVE
Glucose, UA: NEGATIVE
Ketones, UA: NEGATIVE
Leukocytes, UA: NEGATIVE
Nitrite, UA: NEGATIVE
Protein, UA: NEGATIVE
Spec Grav, UA: 1.01
Urobilinogen, UA: 0.2
pH, UA: 5

## 2015-01-04 LAB — MICROALBUMIN / CREATININE URINE RATIO
Creatinine,U: 75.1 mg/dL
Microalb Creat Ratio: 0.9 mg/g (ref 0.0–30.0)
Microalb, Ur: <0.7 mg/dL (ref 0.0–1.9)

## 2015-01-04 LAB — LIPID PANEL
Cholesterol: 137 mg/dL (ref 0–200)
HDL: 43.2 mg/dL (ref >39.00)
LDL Cholesterol: 67 mg/dL (ref 0–99)
NonHDL: 93.8
Total CHOL/HDL Ratio: 3
Triglycerides: 136 mg/dL (ref 0.0–149.0)
VLDL: 27.2 mg/dL (ref 0.0–40.0)

## 2015-01-04 LAB — T4, FREE: Free T4: 1.03 ng/dL (ref 0.60–1.60)

## 2015-01-04 LAB — TSH: TSH: 2.37 u[IU]/mL (ref 0.35–4.50)

## 2015-01-04 NOTE — Progress Notes (Signed)
Patient ID: [name] [name], male   DOB: [birth year], 69 y.o.   MRN: [id number]    Reason for Appointment: Diabetes follow-up   History of Present Illness   Diagnosis: Type 2 DIABETES MELITUS, date of diagnosis: 2009     Previous history: He has been treated with metformin monotherapy for several years with stable control He has generally been able to keep his weight down and watch his diet Unable to exercise much because of chronic back pain  Recent history:    He generally has very good blood sugar control with metformin only A1c has been fairly consistently normal He is checking some readings at various times including after meals Current blood sugar patterns indicate fasting readings are relatively higher although on an average blood sugars at home are still excellent at 118 He has maintained his weight Unable to exercise much because of his continuing back problems Does follow his diet fairly well usually   Oral hypoglycemic drugs:   metformin ER 1500 mg       Side effects from medications: None       Monitors blood glucose:  about once a day.    Glucometer:  One Touch        Blood Glucose readings from meter download:   PRE-MEAL Breakfast Lunch Dinner Bedtime Overall  Glucose range:  98-153     94-153  Mean/median:  121      114    POST-MEAL PC Breakfast PC Lunch PC Dinner  Glucose range:   100-148   94-150   Mean/median:             Physical activity: minimal, occasional swimming         Dietician visit: Most recent: 11/2008    Weight control:  Wt Readings from Last 3 Encounters:  01/04/15 155 lb 9.6 oz (70.58 kg)  10/05/14 158 lb (71.668 kg)  07/10/14 154 lb 3.2 oz (69.945 kg)        Eye exam has been done annually Complications: None, has no symptoms of neuropathy  Diabetes labs:  Lab Results  Component Value Date   HGBA1C 6.0 05/17/2014   HGBA1C 5.9 03/08/2014   HGBA1C 5.7 01/06/2014   Lab Results  Component Value Date   MICROALBUR 0.1  07/26/2013   LDLCALC 32 05/17/2014   CREATININE 0.98 05/27/2014         Medication List       This list is accurate as of: 01/04/15 10:00 AM.  Always use your most recent med list.               amLODipine 5 MG tablet  Commonly known as:  NORVASC  Take 1 tablet (5 mg total) by mouth daily.     aspirin 81 MG tablet  Take 81 mg by mouth daily.     aspirin 325 MG tablet  Take 1 tablet (325 mg total) by mouth daily.     atorvastatin 20 MG tablet  Commonly known as:  LIPITOR  Take 1 tablet (20 mg total) by mouth daily.     fexofenadine 180 MG tablet  Commonly known as:  ALLEGRA  Take 180 mg by mouth daily.     folic acid 407 MCG tablet  Commonly known as:  FOLVITE  Take 800 mcg by mouth daily.     glucose blood test strip  1 each by Other route daily. Use as instructed     levothyroxine 125 MCG tablet  Commonly known as:  SYNTHROID, LEVOTHROID  Take 1 tablet (125 mcg total) by mouth daily.     metFORMIN 500 MG 24 hr tablet  Commonly known as:  GLUCOPHAGE-XR  Take 3 tablets (1,500 mg total) by mouth at bedtime.     multivitamin per tablet  Take 1 tablet by mouth daily.     NEXIUM 24HR 20 MG capsule  Generic drug:  esomeprazole  Take 20 mg by mouth daily at 12 noon.     niacin 500 MG tablet  Take 1 tablet (500 mg total) by mouth at bedtime.     ramipril 10 MG capsule  Commonly known as:  ALTACE  TAKE ONE CAPSULE BY MOUTH EVERY DAY     traMADol 50 MG tablet  Commonly known as:  ULTRAM  Take 50 mg by mouth every 6 (six) hours as needed for severe pain (leg pain).        Allergies: No Known Allergies  Past Medical History  Diagnosis Date  . Inguinal hernia   . AMI (acute mesenteric ischemia)   . HTN (hypertension)   . Hypothyroidism   . Colon polyps   . Hemorrhoids   . GERD (gastroesophageal reflux disease)   . Diverticulosis   . Rectal bleeding   . Heart attack 2004  . Arthritis, lumbar spine   . Lumbar scoliosis   . Diabetes mellitus  without complication   . Allergy   . Cataract     Past Surgical History  Procedure Laterality Date  . Appendectomy  1966  . Coronary artery bypass graft  2004    x 5 / one stent  . Coronary angioplasty with stent placement    . Inguinal hernia repair  1994    right side  . By-pass    . Lumbar laminotomy  10/2009  . Colonoscopy    . Polypectomy    . Cataract extraction      Bil  /   11/2012  . Femur im nail Left 05/25/2014    Procedure: INTRAMEDULLARY (IM) NAIL FEMORAL WITH CABLES;  Surgeon: Meredith Pel, MD;  Location: [facility];  Service: Orthopedics;  Laterality: Left;    Family History  Problem Relation Age of Onset  . Heart disease Father   . Colon cancer Cousin     Social History:  reports that he quit smoking about 41 years ago. His smoking use included Cigarettes. He has a 10 pack-year smoking history. He has never used smokeless tobacco. He reports that he does not drink alcohol or use illicit drugs.  Review of Systems:  Lipids: LDL at target, HDL low normal with regimen of Lipitor 20 mg daily.  Also has been taking niacin for low normal HDL and this is better now  Lab Results  Component Value Date   CHOL 93 05/17/2014   HDL 44.80 05/17/2014   LDLCALC 32 05/17/2014   TRIG 79.0 05/17/2014   CHOLHDL 2 05/17/2014     HYPOTHYROIDISM: This is long-standing and has been on consistent regimen at home with his Synthroid His dose was reduced to 125 g in 9/15 when TSH was low No recent labs available. Overall he feels fairly good   Lab Results  Component Value Date   FREET4 1.56 05/17/2014   FREET4 1.19 03/08/2014   FREET4 1.53 01/06/2014   TSH 0.12* 05/17/2014   TSH 1.02 03/08/2014   TSH 0.12* 01/06/2014    Hypertension:  this is mild and well controlled.   Foot exam done in  4/16, normal findings   Examination:   BP 116/72 mmHg  Pulse 98  Temp(Src) 97.7 F (36.5 C)  Resp 14  Ht 5\' 8"  (1.727 m)  Wt 155 lb 9.6 oz (70.58 kg)  BMI 23.66 kg/m2   SpO2 98%  Body mass index is 23.66 kg/(m^2).   No ankle edema Diabetic foot exam shows normal monofilament sensation in the toes and plantar surfaces, no skin lesions or ulcers on the feet and normal pedal pulses  ASSESSMENT/ PLAN:   Diabetes type 2:  Blood glucose control is good with recently fairly normal blood sugars at home including postprandials with metformin 1500 mg alone He is quite compliant with his diet especially recently He has limited ability to exercise  A1c to be checked today To continue same dose of metformin which he is tolerating well  No history of diabetic complications and has had normal urine microalbumin    HYPERLIPIDEMIA: Currently being treated with niacin and Lipitor because of history of CAD  Hypothyroidism:  His TSH needs to be rechecked today    Kirsti Mcalpine 01/04/2015, 10:00 AM    Addendum: Labs as follows, no change in treatment needed  Appointment on 01/04/2015  Component Date Value Ref Range Status  . Cholesterol 01/04/2015 137  0 - 200 mg/dL Final   ATP III Classification       Desirable:  < 200 mg/dL               Borderline High:  200 - 239 mg/dL          High:  > = 240 mg/dL  . Triglycerides 01/04/2015 136.0  0.0 - 149.0 mg/dL Final   Normal:  <150 mg/dLBorderline High:  150 - 199 mg/dL  . HDL 01/04/2015 43.20  >39.00 mg/dL Final  . VLDL 01/04/2015 27.2  0.0 - 40.0 mg/dL Final  . LDL Cholesterol 01/04/2015 67  0 - 99 mg/dL Final  . Total CHOL/HDL Ratio 01/04/2015 3   Final                  Men          Women1/2 Average Risk     3.4          3.3Average Risk          5.0          4.42X Average Risk          9.6          7.13X Average Risk          15.0          11.0                      . NonHDL 01/04/2015 93.80   Final   NOTE:  Non-HDL goal should be 30 mg/dL higher than patient's LDL goal (i.e. LDL goal of < 70 mg/dL, would have non-HDL goal of < 100 mg/dL)  Lab on 01/04/2015  Component Date Value Ref Range Status  . Hgb A1c MFr Bld  01/04/2015 5.7  4.6 - 6.5 % Final   Glycemic Control Guidelines for People with Diabetes:Non Diabetic:  <6%Goal of Therapy: <7%Additional Action Suggested:  >8%   . Sodium 01/04/2015 136  135 - 145 mEq/L Final  . Potassium 01/04/2015 4.3  3.5 - 5.1 mEq/L Final  . Chloride 01/04/2015 103  96 - 112 mEq/L Final  . CO2 01/04/2015 21  19 - 32 mEq/L Final  . Glucose,  Bld 01/04/2015 94  70 - 99 mg/dL Final  . BUN 01/04/2015 16  6 - 23 mg/dL Final  . Creatinine, Ser 01/04/2015 1.09  0.40 - 1.50 mg/dL Final  . Calcium 01/04/2015 10.0  8.4 - 10.5 mg/dL Final  . GFR 01/04/2015 71.27  >60.00 mL/min Final  . TSH 01/04/2015 2.37  0.35 - 4.50 uIU/mL Final  . Free T4 01/04/2015 1.03  0.60 - 1.60 ng/dL Final  . Microalb, Ur 01/04/2015 <0.7  0.0 - 1.9 mg/dL Final  . Creatinine,U 01/04/2015 75.1   Final  . Microalb Creat Ratio 01/04/2015 0.9  0.0 - 30.0 mg/g Final  . Color, UA 01/04/2015 Yellow   Final  . Clarity, UA 01/04/2015 Clear   Final  . Glucose, UA 01/04/2015 Neg   Final  . Bilirubin, UA 01/04/2015 neg   Final  . Ketones, UA 01/04/2015 Neg   Final  . Spec Grav, UA 01/04/2015 1.010   Final  . Blood, UA 01/04/2015 Neg   Final  . pH, UA 01/04/2015 5.0   Final  . Protein, UA 01/04/2015 Neg   Final  . Urobilinogen, UA 01/04/2015 0.2   Final  . Nitrite, UA 01/04/2015 Neg   Final  . Leukocytes, UA 01/04/2015 Negative   Final

## 2015-01-05 DIAGNOSIS — Z1389 Encounter for screening for other disorder: Secondary | ICD-10-CM | POA: Diagnosis not present

## 2015-01-05 DIAGNOSIS — Z0000 Encounter for general adult medical examination without abnormal findings: Secondary | ICD-10-CM | POA: Diagnosis not present

## 2015-01-05 NOTE — Progress Notes (Signed)
Quick Note:  Please let patient know that the lab results are all normal and A1c 5.7 If taking any calcium or large doses of vitamin D 3 need to reduce this since calcium level is high normal Reports have been sent to his PCP and cardiologist  ______

## 2015-01-09 DIAGNOSIS — E119 Type 2 diabetes mellitus without complications: Secondary | ICD-10-CM | POA: Diagnosis not present

## 2015-01-22 DIAGNOSIS — Z125 Encounter for screening for malignant neoplasm of prostate: Secondary | ICD-10-CM | POA: Diagnosis not present

## 2015-01-22 DIAGNOSIS — N400 Enlarged prostate without lower urinary tract symptoms: Secondary | ICD-10-CM | POA: Diagnosis not present

## 2015-01-29 DIAGNOSIS — L570 Actinic keratosis: Secondary | ICD-10-CM | POA: Diagnosis not present

## 2015-02-05 DIAGNOSIS — M5430 Sciatica, unspecified side: Secondary | ICD-10-CM | POA: Diagnosis not present

## 2015-02-05 DIAGNOSIS — J069 Acute upper respiratory infection, unspecified: Secondary | ICD-10-CM | POA: Diagnosis not present

## 2015-02-05 DIAGNOSIS — J0120 Acute ethmoidal sinusitis, unspecified: Secondary | ICD-10-CM | POA: Diagnosis not present

## 2015-02-08 DIAGNOSIS — G8929 Other chronic pain: Secondary | ICD-10-CM | POA: Insufficient documentation

## 2015-02-08 DIAGNOSIS — M5416 Radiculopathy, lumbar region: Secondary | ICD-10-CM | POA: Diagnosis not present

## 2015-02-08 DIAGNOSIS — M48061 Spinal stenosis, lumbar region without neurogenic claudication: Secondary | ICD-10-CM | POA: Insufficient documentation

## 2015-02-08 DIAGNOSIS — M4806 Spinal stenosis, lumbar region: Secondary | ICD-10-CM | POA: Diagnosis not present

## 2015-02-08 DIAGNOSIS — M549 Dorsalgia, unspecified: Secondary | ICD-10-CM | POA: Diagnosis not present

## 2015-03-06 ENCOUNTER — Encounter: Payer: Self-pay | Admitting: Gastroenterology

## 2015-03-07 ENCOUNTER — Other Ambulatory Visit: Payer: Self-pay | Admitting: Endocrinology

## 2015-03-08 DIAGNOSIS — M545 Low back pain: Secondary | ICD-10-CM | POA: Diagnosis not present

## 2015-03-08 DIAGNOSIS — E780 Pure hypercholesterolemia: Secondary | ICD-10-CM | POA: Diagnosis not present

## 2015-04-03 DIAGNOSIS — M4806 Spinal stenosis, lumbar region: Secondary | ICD-10-CM | POA: Diagnosis not present

## 2015-04-03 DIAGNOSIS — M47816 Spondylosis without myelopathy or radiculopathy, lumbar region: Secondary | ICD-10-CM | POA: Diagnosis not present

## 2015-04-03 DIAGNOSIS — M5126 Other intervertebral disc displacement, lumbar region: Secondary | ICD-10-CM | POA: Diagnosis not present

## 2015-04-03 DIAGNOSIS — S22009D Unspecified fracture of unspecified thoracic vertebra, subsequent encounter for fracture with routine healing: Secondary | ICD-10-CM | POA: Diagnosis not present

## 2015-04-03 DIAGNOSIS — S22089A Unspecified fracture of T11-T12 vertebra, initial encounter for closed fracture: Secondary | ICD-10-CM | POA: Diagnosis not present

## 2015-04-03 DIAGNOSIS — M4316 Spondylolisthesis, lumbar region: Secondary | ICD-10-CM | POA: Diagnosis not present

## 2015-04-03 DIAGNOSIS — S32049D Unspecified fracture of fourth lumbar vertebra, subsequent encounter for fracture with routine healing: Secondary | ICD-10-CM | POA: Diagnosis not present

## 2015-04-07 DIAGNOSIS — M545 Low back pain: Secondary | ICD-10-CM | POA: Diagnosis not present

## 2015-04-07 DIAGNOSIS — E780 Pure hypercholesterolemia: Secondary | ICD-10-CM | POA: Diagnosis not present

## 2015-04-18 DIAGNOSIS — M4806 Spinal stenosis, lumbar region: Secondary | ICD-10-CM | POA: Diagnosis not present

## 2015-04-18 DIAGNOSIS — M549 Dorsalgia, unspecified: Secondary | ICD-10-CM | POA: Diagnosis not present

## 2015-04-18 DIAGNOSIS — G8929 Other chronic pain: Secondary | ICD-10-CM | POA: Diagnosis not present

## 2015-04-18 DIAGNOSIS — M5416 Radiculopathy, lumbar region: Secondary | ICD-10-CM | POA: Diagnosis not present

## 2015-04-19 DIAGNOSIS — E119 Type 2 diabetes mellitus without complications: Secondary | ICD-10-CM | POA: Diagnosis not present

## 2015-04-24 ENCOUNTER — Telehealth: Payer: Self-pay | Admitting: Cardiovascular Disease

## 2015-04-24 DIAGNOSIS — R002 Palpitations: Secondary | ICD-10-CM

## 2015-04-24 DIAGNOSIS — R0602 Shortness of breath: Secondary | ICD-10-CM

## 2015-04-24 NOTE — Telephone Encounter (Signed)
New message      Pt thinks he needs a pacemaker.  He has heart flutter, lft arm pain and sob while walking.  He is ok now but this happens occasional.  Please advise

## 2015-04-24 NOTE — Telephone Encounter (Signed)
I spoke with the pt and he states that last Wednesday he saw his Neurologist in [facility] to review an MRI.  The assistant checked his vital signs with an automatic BP cuff and he was told that his heart rate was 45 and irregular.  Upon recheck his pulse was 55.   Since that time the pt has started to pay more attention to symptoms he has felt and he complains of his "heart quivering", 2 "shocks" in his left arm (not similar to his MI) and SOB when walking to the mail box. The pt also can feel his heart rate slowing down at times.  I reviewed his EKGs and he does have PACs with an average pulse 70-80.  The pt would like further evaluation of his symptoms. I will forward this message to Dr Burt Knack for review.

## 2015-04-26 NOTE — Telephone Encounter (Signed)
This was the question that Dr Burt Knack had responded yes to: Would you like a 48 hour holter on this pt?  Order has been placed for 48 hour holter monitor. I attempted to reach the pt but he did not answer his phone at this time.

## 2015-04-26 NOTE — Telephone Encounter (Signed)
Pt scheduled for holter monitor placement on 04/30/15.

## 2015-04-26 NOTE — Telephone Encounter (Signed)
Yes thanks 

## 2015-04-30 ENCOUNTER — Ambulatory Visit (INDEPENDENT_AMBULATORY_CARE_PROVIDER_SITE_OTHER): Payer: Medicare Other

## 2015-04-30 DIAGNOSIS — R002 Palpitations: Secondary | ICD-10-CM | POA: Diagnosis not present

## 2015-04-30 DIAGNOSIS — R0602 Shortness of breath: Secondary | ICD-10-CM | POA: Diagnosis not present

## 2015-05-04 ENCOUNTER — Telehealth: Payer: Self-pay

## 2015-05-04 NOTE — Telephone Encounter (Signed)
1. The basic rhythm is sinus 2. There are single PVCs and frequent supraventricular complexes, but no sustained arrhythmia. 3. Periods of sinus bradycardia without evidence of high-grade heart block  Recommend continued medical therapy and reassurance    Signed    Electronically signed by Sherren Mocha, MD on 05/04/15 at 1232 EDT     Report approved and finalized on 05/04/2015 1232   Pt aware of results by phone. The pt was advised to decrease caffeine consumption.

## 2015-05-08 DIAGNOSIS — E782 Mixed hyperlipidemia: Secondary | ICD-10-CM | POA: Diagnosis not present

## 2015-05-08 DIAGNOSIS — K5900 Constipation, unspecified: Secondary | ICD-10-CM | POA: Diagnosis not present

## 2015-05-08 DIAGNOSIS — R079 Chest pain, unspecified: Secondary | ICD-10-CM | POA: Diagnosis not present

## 2015-05-08 DIAGNOSIS — E039 Hypothyroidism, unspecified: Secondary | ICD-10-CM | POA: Diagnosis not present

## 2015-06-12 ENCOUNTER — Ambulatory Visit (INDEPENDENT_AMBULATORY_CARE_PROVIDER_SITE_OTHER): Payer: Medicare Other | Admitting: Endocrinology

## 2015-06-12 ENCOUNTER — Other Ambulatory Visit (INDEPENDENT_AMBULATORY_CARE_PROVIDER_SITE_OTHER): Payer: Medicare Other

## 2015-06-12 ENCOUNTER — Encounter: Payer: Self-pay | Admitting: Endocrinology

## 2015-06-12 VITALS — BP 124/82 | HR 72 | Temp 98.1°F | Resp 14 | Ht 68.0 in | Wt 159.2 lb

## 2015-06-12 DIAGNOSIS — E063 Autoimmune thyroiditis: Secondary | ICD-10-CM

## 2015-06-12 DIAGNOSIS — E119 Type 2 diabetes mellitus without complications: Secondary | ICD-10-CM | POA: Diagnosis not present

## 2015-06-12 DIAGNOSIS — E038 Other specified hypothyroidism: Secondary | ICD-10-CM

## 2015-06-12 LAB — BASIC METABOLIC PANEL
BUN: 13 mg/dL (ref 6–23)
CO2: 24 mEq/L (ref 19–32)
Calcium: 9.7 mg/dL (ref 8.4–10.5)
Chloride: 103 mEq/L (ref 96–112)
Creatinine, Ser: 1.13 mg/dL (ref 0.40–1.50)
GFR: 68.28 mL/min (ref >60.00)
Glucose, Bld: 93 mg/dL (ref 70–99)
Potassium: 4.3 mEq/L (ref 3.5–5.1)
Sodium: 137 mEq/L (ref 135–145)

## 2015-06-12 LAB — POCT GLYCOSYLATED HEMOGLOBIN (HGB A1C): Hemoglobin A1C: 5.5

## 2015-06-12 NOTE — Progress Notes (Signed)
Patient ID: [name] [name], male   DOB: 12/19/1945, 69 y.o.   MRN: [id number]    Reason for Appointment: Diabetes follow-up   History of Present Illness   Diagnosis: Type 2 DIABETES MELITUS, date of diagnosis: 2009     Previous history: He has been treated with metformin monotherapy for several years with stable control He has generally been able to keep his weight down and watch his diet Unable to exercise much because of chronic back pain  Recent history:    He has consistently had very good blood sugar control with metformin only A1c has been upper  normal He is checking some readings at various times including after meals Current blood sugar patterns indicate fasting readings are minimally increased Postprandial readings are fairly good with only one high reading from a different meal at lunchtime He has maintained his weight Unable to exercise much because of his continuing back problems Does follow his diet fairly well usually   Oral hypoglycemic drugs:   metformin ER 1500 mg       Side effects from medications: None       Monitors blood glucose:  about once a day or less .    Glucometer:  One Touch        Blood Glucose readings from meter download:  Mean values apply above for all meters except median for One Touch  PRE-MEAL Fasting Lunch Dinner Bedtime Overall  Glucose range:  96-149       Mean/median:  116      115+/-31    POST-MEAL PC Breakfast PC Lunch PC Dinner  Glucose range:   95-197   81-139   Mean/median:   118             Physical activity: gardening         Dietician visit: Most recent: 11/2008    Weight control:  Wt Readings from Last 3 Encounters:  06/12/15 159 lb 3.2 oz (72.213 kg)  01/04/15 155 lb 9.6 oz (70.58 kg)  10/05/14 158 lb (71.668 kg)        Eye exam has been done annually Complications: None, has no symptoms of neuropathy  Diabetes labs:  Lab Results  Component Value Date   HGBA1C 5.5 06/12/2015   HGBA1C 5.7  01/04/2015   HGBA1C 6.0 05/17/2014   Lab Results  Component Value Date   MICROALBUR <0.7 01/04/2015   LDLCALC 67 01/04/2015   CREATININE 1.13 06/12/2015        Medication List       This list is accurate as of: 06/12/15  9:14 PM.  Always use your most recent med list.               amLODipine 5 MG tablet  Commonly known as:  NORVASC  Take 1 tablet (5 mg total) by mouth daily.     aspirin 325 MG tablet  Take 1 tablet (325 mg total) by mouth daily.     atorvastatin 20 MG tablet  Commonly known as:  LIPITOR  Take 1 tablet (20 mg total) by mouth daily.     fexofenadine 180 MG tablet  Commonly known as:  ALLEGRA  Take 180 mg by mouth daily.     folic acid 536 MCG tablet  Commonly known as:  FOLVITE  Take 800 mcg by mouth daily.     glucose blood test strip  1 each by Other route daily. Use as instructed  levothyroxine 125 MCG tablet  Commonly known as:  SYNTHROID, LEVOTHROID  Take 1 tablet by mouth  daily     metFORMIN 500 MG 24 hr tablet  Commonly known as:  GLUCOPHAGE-XR  Take 3 tablets by mouth at  bedtime     multivitamin per tablet  Take 1 tablet by mouth daily.     NEXIUM 24HR 20 MG capsule  Generic drug:  esomeprazole  Take 20 mg by mouth daily at 12 noon.     niacin 500 MG tablet  Take 1 tablet (500 mg total) by mouth at bedtime.     ramipril 10 MG capsule  Commonly known as:  ALTACE  TAKE ONE CAPSULE BY MOUTH EVERY DAY     traMADol 50 MG tablet  Commonly known as:  ULTRAM  Take 50 mg by mouth every 6 (six) hours as needed for severe pain (leg pain).        Allergies: No Known Allergies  Past Medical History  Diagnosis Date  . Inguinal hernia   . AMI (acute mesenteric ischemia) ([place])   . HTN (hypertension)   . Hypothyroidism   . Colon polyps   . Hemorrhoids   . GERD (gastroesophageal reflux disease)   . Diverticulosis   . Rectal bleeding   . Heart attack ([place]) 2004  . Arthritis, lumbar spine   . Lumbar scoliosis   .  Diabetes mellitus without complication ([place])   . Allergy   . Cataract     Past Surgical History  Procedure Laterality Date  . Appendectomy  1966  . Coronary artery bypass graft  2004    x 5 / one stent  . Coronary angioplasty with stent placement    . Inguinal hernia repair  1994    right side  . By-pass    . Lumbar laminotomy  10/2009  . Colonoscopy    . Polypectomy    . Cataract extraction      Bil  /   11/2012  . Femur im nail Left 05/25/2014    Procedure: INTRAMEDULLARY (IM) NAIL FEMORAL WITH CABLES;  Surgeon: Meredith Pel, MD;  Location: [place];  Service: Orthopedics;  Laterality: Left;    Family History  Problem Relation Age of Onset  . Heart disease Father   . Colon cancer Cousin     Social History:  reports that he quit smoking about 41 years ago. His smoking use included Cigarettes. He has a 10 pack-year smoking history. He has never used smokeless tobacco. He reports that he does not drink alcohol or use illicit drugs.  Review of Systems:  Lipids: LDL at target, HDL low normal with regimen of Lipitor 20 mg daily.  Also has been taking niacin for low normal HDL and this is over 40  Lab Results  Component Value Date   CHOL 137 01/04/2015   HDL 43.20 01/04/2015   LDLCALC 67 01/04/2015   TRIG 136.0 01/04/2015   CHOLHDL 3 01/04/2015     HYPOTHYROIDISM: This is long-standing and has been on consistent regimen at home with Synthroid His dose was reduced to 125 g in 9/15 when TSH was low Overall he feels fairly good  Lab Results  Component Value Date   TSH 2.37 01/04/2015   TSH 0.12* 05/17/2014   TSH 1.02 03/08/2014   FREET4 1.03 01/04/2015   FREET4 1.56 05/17/2014   FREET4 1.19 03/08/2014    Hypertension:  this is mild and well controlled.   Foot exam done in 4/16, normal  findings  Eye exam last: 3/16   Examination:   BP 124/82 mmHg  Pulse 72  Temp(Src) 98.1 F (36.7 C)  Resp 14  Ht 5\' 8"  (1.727 m)  Wt 159 lb 3.2 oz (72.213 kg)  BMI  24.21 kg/m2  SpO2 98%  Body mass index is 24.21 kg/(m^2).    ASSESSMENT/ PLAN:   Diabetes type 2:  Blood glucose control is good with  fairly normal blood sugars at home including postprandials with metformin 1500 mg alone He is quite compliant with his diet especially recently He has limited ability to exercise but tries to be as active as possible  A1c is again upper normal and stable To continue same dose of metformin which he is tolerating well  No history of diabetic complications and has had normal urine microalbumin    HYPERLIPIDEMIA: Currently being treated with niacin and Lipitor because of history of CAD levels within target range  Hypothyroidism:  His TSH needs to be rechecked on the next visit  Preventive care he needs to check whether he has received Prevnar  [facility] 06/12/2015, 9:14 PM    Addendum: Labs as follows  Office Visit on 06/12/2015  Component Date Value Ref Range Status  . Hemoglobin A1C 06/12/2015 5.5   Final  Appointment on 06/12/2015  Component Date Value Ref Range Status  . Sodium 06/12/2015 137  135 - 145 mEq/L Final  . Potassium 06/12/2015 4.3  3.5 - 5.1 mEq/L Final  . Chloride 06/12/2015 103  96 - 112 mEq/L Final  . CO2 06/12/2015 24  19 - 32 mEq/L Final  . Glucose, Bld 06/12/2015 93  70 - 99 mg/dL Final  . BUN 06/12/2015 13  6 - 23 mg/dL Final  . Creatinine, Ser 06/12/2015 1.13  0.40 - 1.50 mg/dL Final  . Calcium 06/12/2015 9.7  8.4 - 10.5 mg/dL Final  . GFR 06/12/2015 68.28  >60.00 mL/min Final

## 2015-06-12 NOTE — Patient Instructions (Signed)
Check blood sugars on waking up .. 2 .. times a week Also check blood sugars about 2 hours after a meal and do this after different meals by rotation  Recommended blood sugar levels on waking up is 90-130 and about 2 hours after meal is 140-170 Please bring blood sugar monitor to each visit.  Check on Prevnar shot

## 2015-07-06 DIAGNOSIS — Z23 Encounter for immunization: Secondary | ICD-10-CM | POA: Diagnosis not present

## 2015-07-10 ENCOUNTER — Other Ambulatory Visit: Payer: Self-pay | Admitting: Cardiovascular Disease

## 2015-07-10 ENCOUNTER — Other Ambulatory Visit: Payer: Self-pay | Admitting: Endocrinology

## 2015-07-20 ENCOUNTER — Other Ambulatory Visit: Payer: Self-pay | Admitting: *Deleted

## 2015-07-24 ENCOUNTER — Other Ambulatory Visit: Payer: Self-pay | Admitting: *Deleted

## 2015-07-24 ENCOUNTER — Other Ambulatory Visit: Payer: Self-pay | Admitting: Endocrinology

## 2015-07-24 MED ORDER — LEVOTHYROXINE SODIUM 125 MCG PO TABS: ORAL_TABLET | ORAL | Status: DC

## 2015-09-27 ENCOUNTER — Other Ambulatory Visit: Payer: Self-pay | Admitting: Cardiovascular Disease

## 2015-09-27 ENCOUNTER — Other Ambulatory Visit: Payer: Self-pay | Admitting: Endocrinology

## 2015-10-02 ENCOUNTER — Encounter: Payer: Self-pay | Admitting: Endocrinology

## 2015-10-03 ENCOUNTER — Other Ambulatory Visit: Payer: Self-pay | Admitting: *Deleted

## 2015-10-03 MED ORDER — GLUCOSE BLOOD VI STRP: ORAL_STRIP | Status: DC

## 2015-10-04 ENCOUNTER — Encounter: Payer: Self-pay | Admitting: Endocrinology

## 2015-10-25 ENCOUNTER — Other Ambulatory Visit: Payer: Self-pay | Admitting: Endocrinology

## 2015-10-29 ENCOUNTER — Encounter: Payer: Self-pay | Admitting: Endocrinology

## 2015-12-12 ENCOUNTER — Encounter: Payer: Self-pay | Admitting: Endocrinology

## 2015-12-12 ENCOUNTER — Ambulatory Visit (INDEPENDENT_AMBULATORY_CARE_PROVIDER_SITE_OTHER): Payer: Medicare Other | Admitting: Endocrinology

## 2015-12-12 VITALS — BP 118/68 | HR 70 | Temp 97.9°F | Resp 14 | Ht 68.0 in | Wt 158.6 lb

## 2015-12-12 DIAGNOSIS — E785 Hyperlipidemia, unspecified: Secondary | ICD-10-CM

## 2015-12-12 DIAGNOSIS — E119 Type 2 diabetes mellitus without complications: Secondary | ICD-10-CM

## 2015-12-12 DIAGNOSIS — E038 Other specified hypothyroidism: Secondary | ICD-10-CM

## 2015-12-12 DIAGNOSIS — E063 Autoimmune thyroiditis: Secondary | ICD-10-CM

## 2015-12-12 LAB — COMPREHENSIVE METABOLIC PANEL
ALT: 19 U/L (ref 0–53)
AST: 19 U/L (ref 0–37)
Albumin: 4.4 g/dL (ref 3.5–5.2)
Alkaline Phosphatase: 60 U/L (ref 39–117)
BUN: 19 mg/dL (ref 6–23)
CO2: 24 mEq/L (ref 19–32)
Calcium: 9.5 mg/dL (ref 8.4–10.5)
Chloride: 105 mEq/L (ref 96–112)
Creatinine, Ser: 1.25 mg/dL (ref 0.40–1.50)
GFR: 60.68 mL/min (ref >60.00)
Glucose, Bld: 96 mg/dL (ref 70–99)
Potassium: 4.4 mEq/L (ref 3.5–5.1)
Sodium: 137 mEq/L (ref 135–145)
Total Bilirubin: 0.5 mg/dL (ref 0.2–1.2)
Total Protein: 7.1 g/dL (ref 6.0–8.3)

## 2015-12-12 LAB — MICROALBUMIN / CREATININE URINE RATIO
Creatinine,U: 41.3 mg/dL
Microalb Creat Ratio: 1.7 mg/g (ref 0.0–30.0)
Microalb, Ur: <0.7 mg/dL (ref 0.0–1.9)

## 2015-12-12 LAB — TSH: TSH: 1.15 u[IU]/mL (ref 0.35–4.50)

## 2015-12-12 LAB — LIPID PANEL
Cholesterol: 106 mg/dL (ref 0–200)
HDL: 41.5 mg/dL (ref >39.00)
LDL Cholesterol: 46 mg/dL (ref 0–99)
NonHDL: 64.09
Total CHOL/HDL Ratio: 3
Triglycerides: 88 mg/dL (ref 0.0–149.0)
VLDL: 17.6 mg/dL (ref 0.0–40.0)

## 2015-12-12 LAB — POCT GLYCOSYLATED HEMOGLOBIN (HGB A1C): Hemoglobin A1C: 5.6

## 2015-12-12 LAB — HEMOGLOBIN A1C: Hemoglobin A1C: 5.6

## 2015-12-12 NOTE — Progress Notes (Signed)
Quick Note:  Please let patient know that the lab results are good, no change in thyroid dose Need to schedule follow-up for 6 months with same day labs ______

## 2015-12-12 NOTE — Progress Notes (Signed)
Patient ID: [name] [name], male   DOB: 1945/12/20, 70 y.o.   MRN: [id number]    Reason for Appointment: Diabetes follow-up   History of Present Illness   Diagnosis: Type 2 DIABETES MELITUS, date of diagnosis: 2009     Previous history: He has been treated with metformin monotherapy for several years with stable control He has generally been able to keep his weight down and watch his diet Unable to exercise much because of chronic back pain  Recent history:    He has consistently had very good blood sugar control with metformin only A1c has been upper  normal and is the same today  He is checking some readings at various times including after meals but did not bring his monitor for download today  Postprandial readings are fairly good with only one high reading that he remembers He has maintained his weight Unable to exercise much because of his continuing back problems Does follow his diet fairly well usually   Oral hypoglycemic drugs:   metformin ER 1500 mg       Side effects from medications: None       Monitors blood glucose:  about once a day or less .    Glucometer:  One Touch        Blood Glucose readings highest 161, 106 or so in the morning  Physical activity: gardening when he is able to         Dietician visit: Most recent: 11/2008    Weight control:  Wt Readings from Last 3 Encounters:  12/12/15 158 lb 9.6 oz (71.94 kg)  06/12/15 159 lb 3.2 oz (72.213 kg)  01/04/15 155 lb 9.6 oz (70.58 kg)        Eye exam has been done annually Complications: None, has no symptoms of neuropathy  Diabetes labs:  Lab Results  Component Value Date   HGBA1C 5.6 12/12/2015   HGBA1C 5.5 06/12/2015   HGBA1C 5.7 01/04/2015   Lab Results  Component Value Date   MICROALBUR <0.7 01/04/2015   LDLCALC 67 01/04/2015   CREATININE 1.13 06/12/2015        Medication List       This list is accurate as of: 12/12/15  8:20 AM.  Always use your most recent med list.                 amLODipine 5 MG tablet  Commonly known as:  NORVASC  Take 1 tablet by mouth  daily     aspirin 325 MG tablet  Take 1 tablet (325 mg total) by mouth daily.     atorvastatin 20 MG tablet  Commonly known as:  LIPITOR  Take 1 tablet by mouth  daily     fexofenadine 180 MG tablet  Commonly known as:  ALLEGRA  Take 180 mg by mouth daily.     folic acid A999333 MCG tablet  Commonly known as:  FOLVITE  Take 800 mcg by mouth daily.     glucose blood test strip  Use as instructed to check blood sugar once a day dx code E11.9     levothyroxine 125 MCG tablet  Commonly known as:  SYNTHROID, LEVOTHROID  Take 1 tablet by mouth  daily     metFORMIN 500 MG 24 hr tablet  Commonly known as:  GLUCOPHAGE-XR  Take 3 tablets by mouth at  bedtime     multivitamin per tablet  Take 1 tablet by mouth daily.  NEXIUM 24HR 20 MG capsule  Generic drug:  esomeprazole  Take 20 mg by mouth daily at 12 noon.     niacin 500 MG tablet  Take 1 tablet (500 mg total) by mouth at bedtime.     ramipril 10 MG capsule  Commonly known as:  ALTACE  Take 1 capsule by mouth  daily     traMADol 50 MG tablet  Commonly known as:  ULTRAM  Take 50 mg by mouth every 6 (six) hours as needed for severe pain (leg pain).        Allergies: No Known Allergies  Past Medical History  Diagnosis Date  . Inguinal hernia   . AMI (acute mesenteric ischemia) ([place])   . HTN (hypertension)   . Hypothyroidism   . Colon polyps   . Hemorrhoids   . GERD (gastroesophageal reflux disease)   . Diverticulosis   . Rectal bleeding   . Heart attack ([place]) 2004  . Arthritis, lumbar spine   . Lumbar scoliosis   . Diabetes mellitus without complication ([place])   . Allergy   . Cataract     Past Surgical History  Procedure Laterality Date  . Appendectomy  1966  . Coronary artery bypass graft  2004    x 5 / one stent  . Coronary angioplasty with stent placement    . Inguinal hernia repair  1994    right side   . By-pass    . Lumbar laminotomy  10/2009  . Colonoscopy    . Polypectomy    . Cataract extraction      Bil  /   11/2012  . Femur im nail Left 05/25/2014    Procedure: INTRAMEDULLARY (IM) NAIL FEMORAL WITH CABLES;  Surgeon: Meredith Pel, MD;  Location: [facility];  Service: Orthopedics;  Laterality: Left;    Family History  Problem Relation Age of Onset  . Heart disease Father   . Colon cancer Cousin     Social History:  reports that he quit smoking about 42 years ago. His smoking use included Cigarettes. He has a 10 pack-year smoking history. He has never used smokeless tobacco. He reports that he does not drink alcohol or use illicit drugs.  Review of Systems:  Lipids: LDL at target regimen of Lipitor 20 mg daily.  Also has been taking niacin for low normal HDL and this is over 40  Lab Results  Component Value Date   CHOL 137 01/04/2015   HDL 43.20 01/04/2015   LDLCALC 67 01/04/2015   TRIG 136.0 01/04/2015   CHOLHDL 3 01/04/2015     HYPOTHYROIDISM: This is long-standing and has been on consistent regimen at home with Synthroid His dose was reduced to 125 g in 9/15 when TSH was low, Needs follow-up  Overall he feels fairly good  Lab Results  Component Value Date   TSH 2.37 01/04/2015   TSH 0.12* 05/17/2014   TSH 1.02 03/08/2014   FREET4 1.03 01/04/2015   FREET4 1.56 05/17/2014   FREET4 1.19 03/08/2014    Hypertension:  this is well controlled.   Foot exam done in 4/17, normal findings  Eye exam last: 3/16   Examination:   BP 118/68 mmHg  Pulse 70  Temp(Src) 97.9 F (36.6 C)  Resp 14  Ht 5\' 8"  (1.727 m)  Wt 158 lb 9.6 oz (71.94 kg)  BMI 24.12 kg/m2  SpO2 97%  Body mass index is 24.12 kg/(m^2).   Diabetic Foot Exam - Simple   Simple Foot  Form  Diabetic Foot exam was performed with the following findings:  Yes 12/12/2015  8:18 AM  Visual Inspection  No deformities, no ulcerations, no other skin breakdown bilaterally:  Yes  Sensation Testing   Intact to touch and monofilament testing bilaterally:  Yes  Pulse Check  Posterior Tibialis and Dorsalis pulse intact bilaterally:  Yes  Comments       ASSESSMENT/ PLAN:   Diabetes type 2 without obesity:  Blood glucose control is good with  fairly normal blood sugars at home including  A1c today  Although unable to review his home monitor for blood sugar patterns he thinks they are fairly consistently good both fasting and after meals No side effects with metformin ER 1500 mg a day He is quite compliant with his diet usually   He has limited ability to exercise but tries to be as active as possible   To continue same dose of metformin and follow-up in 6 months  No history of diabetic complications and has had normal urine microalbumin , this will be checked today   HYPERLIPIDEMIA: Currently being treated with niacin and Lipitor because of history of CAD  Needs follow-up  Hypothyroidism:  His TSH needs to be rechecked    [facility] 12/12/2015, 8:20 AM

## 2015-12-12 NOTE — Addendum Note (Signed)
Addended by: Kaylyn Lim I on: 12/12/2015 08:26 AM   Modules accepted: Orders

## 2015-12-13 ENCOUNTER — Other Ambulatory Visit: Payer: Self-pay | Admitting: Cardiovascular Disease

## 2016-02-15 ENCOUNTER — Other Ambulatory Visit: Payer: Self-pay | Admitting: Cardiovascular Disease

## 2016-02-15 ENCOUNTER — Other Ambulatory Visit: Payer: Self-pay | Admitting: Endocrinology

## 2016-02-18 ENCOUNTER — Other Ambulatory Visit: Payer: Self-pay | Admitting: Cardiovascular Disease

## 2016-02-20 ENCOUNTER — Ambulatory Visit (INDEPENDENT_AMBULATORY_CARE_PROVIDER_SITE_OTHER): Payer: Medicare Other | Admitting: Cardiovascular Disease

## 2016-02-20 ENCOUNTER — Encounter: Payer: Self-pay | Admitting: Cardiovascular Disease

## 2016-02-20 VITALS — BP 130/80 | HR 86 | Ht 69.0 in | Wt 158.2 lb

## 2016-02-20 DIAGNOSIS — E785 Hyperlipidemia, unspecified: Secondary | ICD-10-CM

## 2016-02-20 DIAGNOSIS — I25810 Atherosclerosis of coronary artery bypass graft(s) without angina pectoris: Secondary | ICD-10-CM | POA: Diagnosis not present

## 2016-02-20 MED ORDER — NIACIN 500 MG PO TABS: 500.0000 mg | ORAL_TABLET | Freq: Every day | ORAL | Status: DC

## 2016-02-20 NOTE — Progress Notes (Signed)
Cardiology Office Note Date:  02/20/2016   ID:  Oneal, Collaso 1946/04/24, MRN [id number]  PCP:  Donnie Coffin, MD  Cardiologist:  Sherren Mocha, MD    Chief Complaint  Patient presents with  . CAD native vessel  . Essential Hypertension  . Hyperlipidemia     History of Present Illness: [name] [name] is a 70 y.o. male who presents for follow-up evaluation. The patient has a history of coronary artery disease, initially treated with primary PCI when he presented with an acute myocardial infarction in 2004. He ultimately was treated with multivessel CABG. His most recent nuclear scan was in 2012 and it demonstrated no ischemia. Other cardiovascular problems include hyperlipidemia, hypertension, and type 2 diabetes.  He's limited by back pain. Can only stand 15 minutes. He can't tolerate opiates. Has been dealing with this for many years. From a CV perspective he's doing fine. Today, he denies symptoms of palpitations, chest pain, shortness of breath, orthopnea, PND, lower extremity edema, dizziness, or syncope.  Past Medical History  Diagnosis Date  . Inguinal hernia   . AMI (acute mesenteric ischemia) ([place])   . HTN (hypertension)   . Hypothyroidism   . Colon polyps   . Hemorrhoids   . GERD (gastroesophageal reflux disease)   . Diverticulosis   . Rectal bleeding   . Heart attack ([place]) 2004  . Arthritis, lumbar spine   . Lumbar scoliosis   . Diabetes mellitus without complication ([place])   . Allergy   . Cataract     Past Surgical History  Procedure Laterality Date  . Appendectomy  1966  . Coronary artery bypass graft  2004    x 5 / one stent  . Coronary angioplasty with stent placement    . Inguinal hernia repair  1994    right side  . By-pass    . Lumbar laminotomy  10/2009  . Colonoscopy    . Polypectomy    . Cataract extraction      Bil  /   11/2012  . Femur im nail Left 05/25/2014    Procedure: INTRAMEDULLARY (IM) NAIL FEMORAL WITH CABLES;  Surgeon:  Meredith Pel, MD;  Location: [facility];  Service: Orthopedics;  Laterality: Left;    Current Outpatient Prescriptions  Medication Sig Dispense Refill  . aspirin 325 MG tablet Take 1 tablet (325 mg total) by mouth daily. 30 tablet 0  . esomeprazole (NEXIUM 24HR) 20 MG capsule Take 20 mg by mouth daily at 12 noon.    . fexofenadine (ALLEGRA) 180 MG tablet Take 180 mg by mouth daily.      . folic acid (FOLVITE) A999333 MCG tablet Take 800 mcg by mouth daily.     Marland Kitchen levothyroxine (SYNTHROID, LEVOTHROID) 125 MCG tablet Take 1 tablet by mouth  daily 90 tablet 1  . metFORMIN (GLUCOPHAGE-XR) 500 MG 24 hr tablet Take 3 tablets by mouth at  bedtime 270 tablet 1  . multivitamin (THERAGRAN) per tablet Take 1 tablet by mouth daily.      . niacin 500 MG tablet Take 1 tablet (500 mg total) by mouth at bedtime. 90 tablet 3  . ONETOUCH VERIO test strip Check blood sugar once a  day 100 each 2  . traMADol (ULTRAM) 50 MG tablet Take 50 mg by mouth every 6 (six) hours as needed for severe pain (leg pain).    Marland Kitchen amLODipine (NORVASC) 5 MG tablet Take 1 tablet by mouth  daily 90 tablet 3  . atorvastatin (  LIPITOR) 20 MG tablet Take 1 tablet by mouth  daily 90 tablet 3  . ramipril (ALTACE) 10 MG capsule Take 1 capsule by mouth  daily 90 capsule 3   No current facility-administered medications for this visit.    Allergies:   Review of patient's allergies indicates no known allergies.   Social History:  The patient  reports that he quit smoking about 42 years ago. His smoking use included Cigarettes. He has a 10 pack-year smoking history. He has never used smokeless tobacco. He reports that he does not drink alcohol or use illicit drugs.   Family History:  The patient's family history includes Colon cancer in his cousin; Heart disease in his father.    ROS:  Please see the history of present illness.  Otherwise, review of systems is positive for back pain.  All other systems are reviewed and negative.    PHYSICAL  EXAM: VS:  BP 130/80 mmHg  Pulse 86  Ht 5\' 9"  (1.753 m)  Wt 158 lb 3.2 oz (71.759 kg)  BMI 23.35 kg/m2 , BMI Body mass index is 23.35 kg/(m^2). GEN: Well nourished, well developed, in no acute distress HEENT: normal Neck: no JVD, no masses. No carotid bruits Cardiac: RRR without murmur or gallop                Respiratory:  clear to auscultation bilaterally, normal work of breathing GI: soft, nontender, nondistended, + BS MS: no deformity or atrophy Ext: no pretibial edema, pedal pulses 2+= bilaterally Skin: warm and dry, no rash Neuro:  Strength and sensation are intact Psych: euthymic mood, full affect  EKG:  EKG is ordered today. The ekg ordered today shows sinus rhythm 86 bpm, frequent PACs, nonspecific ST and T-wave abnormality.  Recent Labs: 12/12/2015: ALT 19; BUN 19; Creatinine, Ser 1.25; Potassium 4.4; Sodium 137; TSH 1.15   Lipid Panel     Component Value Date/Time   CHOL 106 12/12/2015 0826   TRIG 88.0 12/12/2015 0826   HDL 41.50 12/12/2015 0826   CHOLHDL 3 12/12/2015 0826   VLDL 17.6 12/12/2015 0826   LDLCALC 46 12/12/2015 0826      Wt Readings from Last 3 Encounters:  02/20/16 158 lb 3.2 oz (71.759 kg)  12/12/15 158 lb 9.6 oz (71.94 kg)  06/12/15 159 lb 3.2 oz (72.213 kg)     Cardiac Studies Reviewed: Holter Monitor 05-08-2015: Study Highlights    1. The basic rhythm is sinus 2. There are single PVCs and frequent supraventricular complexes, but no sustained arrhythmia. 3. Periods of sinus bradycardia without evidence of high-grade heart block  Recommend continued medical therapy and reassurance   ASSESSMENT AND PLAN: 1.  CAD, native vessel, without symptoms of angina: The patient appears stable. He is 13 years out from CABG and has multiple comorbid conditions including type 2 diabetes. I have recommended a Lexiscan Myoview stress test for further risk stratification.  2. Hyperlipidemia: Lipid lowering therapies were adjusted last year based on cost.  He is now treated with a combination of atorvastatin and short acting niacin. Lipids reviewed as above and they are excellent with LDL cholesterol at goal.  3. Essential hypertension: Blood pressure is well controlled on a combination of amlodipine and ramipril.  4. Type 2 diabetes: The patient is followed by Dr. Dwyane Dee. His most recent office note is reviewed. Hemoglobin A1c is at goal of 5.6  5. Vascular screening: The patient requests information on vascular screening. He is given information on obtaining carotid, abdominal aortic  ultrasound, and ABI's.   Current medicines are reviewed with the patient today.  The patient does not have concerns regarding medicines.  Labs/ tests ordered today include:   Orders Placed This Encounter  Procedures  . Myocardial Perfusion Imaging  . EKG 12-Lead    Disposition:   FU one year  Signed, Sherren Mocha, MD  02/20/2016 1:10 PM    [facility] [place], [place], [place]  [address] Phone: [phone]; Fax: [phone]

## 2016-02-20 NOTE — Patient Instructions (Addendum)
Medication Instructions:  Your physician recommends that you continue on your current medications as directed. Please refer to the Current Medication list given to you today.  Labwork: No new orders.   Testing/Procedures: Your physician has requested that you have a lexiscan myoview. For further information please visit [email or web address]. Please follow instruction sheet, as given.  Vasuscreen--screening for carotid disease, abdominal aortic aneurysm and peripheral artery disease in lower extremities ($125.00 out of pocket) Nothing to eat or drink after midnight prior to this test  Follow-Up: Your physician wants you to follow-up in: 1 YEAR with Dr Burt Knack.  You will receive a reminder letter in the mail two months in advance. If you don't receive a letter, please call our office to schedule the follow-up appointment.   Any Other Special Instructions Will Be Listed Below (If Applicable).     If you need a refill on your cardiac medications before your next appointment, please call your pharmacy.

## 2016-02-28 ENCOUNTER — Ambulatory Visit (HOSPITAL_COMMUNITY)
Admission: RE | Admit: 2016-02-28 | Discharge: 2016-02-28 | Disposition: A | Discharge status: Home / Self Care | Payer: Medicare Other | Source: Ambulatory Visit | Attending: Cardiovascular Disease | Admitting: Cardiovascular Disease

## 2016-02-28 DIAGNOSIS — E785 Hyperlipidemia, unspecified: Secondary | ICD-10-CM

## 2016-02-28 DIAGNOSIS — I25810 Atherosclerosis of coronary artery bypass graft(s) without angina pectoris: Secondary | ICD-10-CM

## 2016-03-24 ENCOUNTER — Telehealth (HOSPITAL_COMMUNITY): Payer: Self-pay | Admitting: *Deleted

## 2016-03-24 NOTE — Telephone Encounter (Signed)
Patient given detailed instructions per Myocardial Perfusion Study Information Sheet for the test on 03/27/16 at 0945. Patient notified to arrive 15 minutes early and that it is imperative to arrive on time for appointment to keep from having the test rescheduled.  If you need to cancel or reschedule your appointment, please call the office within 24 hours of your appointment. Failure to do so may result in a cancellation of your appointment, and a $50 no show fee. Patient verbalized understanding.[name] [name], Ranae Palms

## 2016-03-27 ENCOUNTER — Ambulatory Visit (HOSPITAL_COMMUNITY): Payer: Medicare Other | Attending: Cardiology

## 2016-03-27 DIAGNOSIS — R9439 Abnormal result of other cardiovascular function study: Secondary | ICD-10-CM | POA: Insufficient documentation

## 2016-03-27 DIAGNOSIS — I10 Essential (primary) hypertension: Secondary | ICD-10-CM | POA: Insufficient documentation

## 2016-03-27 DIAGNOSIS — I25810 Atherosclerosis of coronary artery bypass graft(s) without angina pectoris: Secondary | ICD-10-CM

## 2016-03-27 DIAGNOSIS — E785 Hyperlipidemia, unspecified: Secondary | ICD-10-CM

## 2016-03-27 LAB — MYOCARDIAL PERFUSION IMAGING
LV dias vol: 86 mL (ref 62–150)
LV sys vol: 38 mL
Peak HR: 85 {beats}/min
RATE: 0.3
Rest HR: 63 {beats}/min
SDS: 7
SRS: 2
SSS: 9
TID: 1

## 2016-03-27 MED ORDER — TECHNETIUM TC 99M TETROFOSMIN IV KIT
31.0000 | PACK | Freq: Once | INTRAVENOUS | Status: AC | PRN
  Administered 2016-03-27: 31 via INTRAVENOUS
  Filled 2016-03-27: qty 31

## 2016-03-27 MED ORDER — REGADENOSON 0.4 MG/5ML IV SOLN
0.4000 mg | Freq: Once | INTRAVENOUS | Status: AC
  Administered 2016-03-27: 0.4 mg via INTRAVENOUS

## 2016-03-27 MED ORDER — TECHNETIUM TC 99M TETROFOSMIN IV KIT
10.3000 | PACK | Freq: Once | INTRAVENOUS | Status: AC | PRN
  Administered 2016-03-27: 10 via INTRAVENOUS
  Filled 2016-03-27: qty 10

## 2016-04-28 ENCOUNTER — Encounter: Payer: Self-pay | Admitting: Endocrinology

## 2016-05-16 ENCOUNTER — Other Ambulatory Visit: Payer: Self-pay | Admitting: Endocrinology

## 2016-06-16 ENCOUNTER — Encounter: Payer: Self-pay | Admitting: Endocrinology

## 2016-06-16 ENCOUNTER — Ambulatory Visit (INDEPENDENT_AMBULATORY_CARE_PROVIDER_SITE_OTHER): Payer: Medicare Other | Admitting: Endocrinology

## 2016-06-16 VITALS — BP 135/85 | HR 73 | Temp 98.1°F | Resp 14 | Ht 69.0 in | Wt 157.4 lb

## 2016-06-16 DIAGNOSIS — Z23 Encounter for immunization: Secondary | ICD-10-CM | POA: Diagnosis not present

## 2016-06-16 DIAGNOSIS — E038 Other specified hypothyroidism: Secondary | ICD-10-CM | POA: Diagnosis not present

## 2016-06-16 DIAGNOSIS — E119 Type 2 diabetes mellitus without complications: Secondary | ICD-10-CM

## 2016-06-16 DIAGNOSIS — E063 Autoimmune thyroiditis: Secondary | ICD-10-CM

## 2016-06-16 LAB — BASIC METABOLIC PANEL
BUN: 16 mg/dL (ref 6–23)
CO2: 25 mEq/L (ref 19–32)
Calcium: 9.7 mg/dL (ref 8.4–10.5)
Chloride: 102 mEq/L (ref 96–112)
Creatinine, Ser: 1.15 mg/dL (ref 0.40–1.50)
GFR: 66.71 mL/min (ref >60.00)
Glucose, Bld: 100 mg/dL — ABNORMAL HIGH (ref 70–99)
Potassium: 4.4 mEq/L (ref 3.5–5.1)
Sodium: 136 mEq/L (ref 135–145)

## 2016-06-16 LAB — POCT GLYCOSYLATED HEMOGLOBIN (HGB A1C): Hemoglobin A1C: 5.5

## 2016-06-16 LAB — TSH: TSH: 1.6 u[IU]/mL (ref 0.35–4.50)

## 2016-06-16 NOTE — Patient Instructions (Signed)
Check blood sugars on waking up  2x per week  Also check blood sugars about 2 hours after a meal and do this after different meals by rotation  Recommended blood sugar levels on waking up is 90-130 and about 2 hours after meal is 130-160  Please bring your blood sugar monitor to each visit, thank you  

## 2016-06-16 NOTE — Progress Notes (Signed)
Patient ID: [name] [name], male   DOB: Sep 24, 1945, 70 y.o.   MRN: [id number]    Reason for Appointment: Diabetes follow-up   History of Present Illness   Diagnosis: Type 2 DIABETES MELITUS, date of diagnosis: 2009     Previous history: He has been treated with metformin monotherapy for several years with stable control He has generally been able to keep his weight down and watch his diet Unable to exercise much because of chronic back pain  Recent history:    He has consistently had  Excellent blood sugar control with metformin only A1c has been upper  normal and is the same today  At 5.5  He is checking some readings at various times including after meals  Postprandial readings are fairly good   He has maintained his weight Unable to exercise much because of his continuing back problems Does follow his diet fairly well usually  With portion sizes and carbohydrates   Oral hypoglycemic drugs:   metformin ER 1500 mg       Side effects from medications: None       Monitors blood glucose:  about once a day or less .    Glucometer:  One Touch        Blood Glucose readings  Range from 93-127 at different times of the day with average about 115 in the morning  Physical activity: gardening when he is able to       Dietician visit: Most recent: 11/2008    Weight control:  Wt Readings from Last 3 Encounters:  06/16/16 157 lb 6.4 oz (71.4 kg)  03/27/16 158 lb (71.7 kg)  02/20/16 158 lb 3.2 oz (71.8 kg)        Eye exam has been done annually Complications: None, has no symptoms of neuropathy  Diabetes labs:  Lab Results  Component Value Date   HGBA1C 5.6 12/12/2015   HGBA1C 5.5 06/12/2015   HGBA1C 5.7 01/04/2015   Lab Results  Component Value Date   MICROALBUR <0.7 12/12/2015   LDLCALC 46 12/12/2015   CREATININE 1.25 12/12/2015        Medication List       Accurate as of 06/16/16  8:20 AM. Always use your most recent med list.            amLODipine 5 MG tablet Commonly known as:  NORVASC Take 1 tablet by mouth  daily   aspirin 325 MG tablet Take 1 tablet (325 mg total) by mouth daily.   atorvastatin 20 MG tablet Commonly known as:  LIPITOR Take 1 tablet by mouth  daily   fexofenadine 180 MG tablet Commonly known as:  ALLEGRA Take 180 mg by mouth daily.   folic acid A999333 MCG tablet Commonly known as:  FOLVITE Take 800 mcg by mouth daily.   levothyroxine 125 MCG tablet Commonly known as:  SYNTHROID, LEVOTHROID Take 1 tablet by mouth  daily   metFORMIN 500 MG 24 hr tablet Commonly known as:  GLUCOPHAGE-XR Take 3 tablets by mouth at  bedtime   multivitamin per tablet Take 1 tablet by mouth daily.   NEXIUM 24HR 20 MG capsule Generic drug:  esomeprazole Take 20 mg by mouth daily at 12 noon.   niacin 500 MG tablet Take 1 tablet (500 mg total) by mouth at bedtime.   ONETOUCH VERIO test strip Generic drug:  glucose blood Check blood sugar once a  day   ramipril 10 MG capsule Commonly known  as:  ALTACE Take 1 capsule by mouth  daily   traMADol 50 MG tablet Commonly known as:  ULTRAM Take 50 mg by mouth every 6 (six) hours as needed for severe pain (leg pain).       Allergies: No Known Allergies  Past Medical History:  Diagnosis Date  . Allergy   . AMI (acute mesenteric ischemia) ([place])   . 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 Cataract   . Colon polyps   . Diabetes mellitus without complication ([place])   . Diverticulosis   . GERD (gastroesophageal reflux disease)   . Heart attack 2004  . Hemorrhoids   . HTN (hypertension)   . Hypothyroidism   . Inguinal hernia   . Lumbar scoliosis   . Rectal bleeding     Past Surgical History:  Procedure Laterality Date  . APPENDECTOMY  1966  . by-pass    . CATARACT EXTRACTION     Bil  /   11/2012  . COLONOSCOPY    . CORONARY ANGIOPLASTY WITH STENT PLACEMENT    . CORONARY ARTERY BYPASS GRAFT  2004   x 5 / one stent  . FEMUR IM NAIL Left 05/25/2014    Procedure: INTRAMEDULLARY (IM) NAIL FEMORAL WITH CABLES;  Surgeon: Meredith Pel, MD;  Location: [facility];  Service: Orthopedics;  Laterality: Left;  . INGUINAL HERNIA REPAIR  1994   right side  . lumbar laminotomy  10/2009  . POLYPECTOMY      Family History  Problem Relation Age of Onset  . Heart disease Father   . Colon cancer Cousin     Social History:  reports that he quit smoking about 42 years ago. His smoking use included Cigarettes. He has a 10.00 pack-year smoking history. He has never used smokeless tobacco. He reports that he does not drink alcohol or use drugs.  Review of Systems:  Lipids: LDL at target  With his treatment of Lipitor 20 mg daily.   Also has been taking niacin for low normal HDL    Lab Results  Component Value Date   CHOL 106 12/12/2015   HDL 41.50 12/12/2015   LDLCALC 46 12/12/2015   TRIG 88.0 12/12/2015   CHOLHDL 3 12/12/2015     HYPOTHYROIDISM: This is long-standing and has been on consistent regimen at home with Synthroid His dose was reduced to 125 g in 9/15 when TSH was low  labs to be done today  Overall he feels fairly good  Lab Results  Component Value Date   TSH 1.15 12/12/2015   TSH 2.37 01/04/2015   TSH 0.12 (L) 05/17/2014   FREET4 1.03 01/04/2015   FREET4 1.56 05/17/2014   FREET4 1.19 03/08/2014    Hypertension:    Foot exam done in 4/17, normal findings  Eye exam last: 3/16   Examination:   BP 135/85 (BP Location: Left Arm)   Pulse 73   Temp 98.1 F (36.7 C)   Resp 14   Ht 5\' 9"  (1.753 m)   Wt 157 lb 6.4 oz (71.4 kg)   SpO2 97%   BMI 23.24 kg/m   Body mass index is 23.24 kg/m.   ASSESSMENT/ PLAN:   Diabetes type 2 without obesity:  Blood glucose control is good with  fairly normal blood sugars at home except minimally increased readings fasting   A1c again 5.5 No side effects with metformin ER 1500 mg a day He is quite compliant with his diet usually   He has limited ability  to exercise but tries  to be as active as possible   To continue same dose of metformin and follow-up in 6 months  No history of diabetic complications and has had normal urine microalbumin    HYPERLIPIDEMIA: Currently being treated with niacin and Lipitor because of history of CAD  Needs follow-up -to be done with his cardiologist or PCP  Hypothyroidism:  His TSH needs to be rechecked  today   Wendelin Bradt 06/16/2016, 8:20 AM       addendum: TSH 1.6, chemistry normal

## 2016-06-22 ENCOUNTER — Other Ambulatory Visit: Payer: Self-pay | Admitting: Endocrinology

## 2016-07-26 IMAGING — CR DG FEMUR 2+V PORT*L*
4 series · 4 of 4 positions shown · non-contrast
Comparison: Intraoperative imaging same day

CLINICAL DATA: Post ORIF, left femur fracture

EXAM:
PORTABLE LEFT FEMUR - 2 VIEW

[AP (1 of 2)]
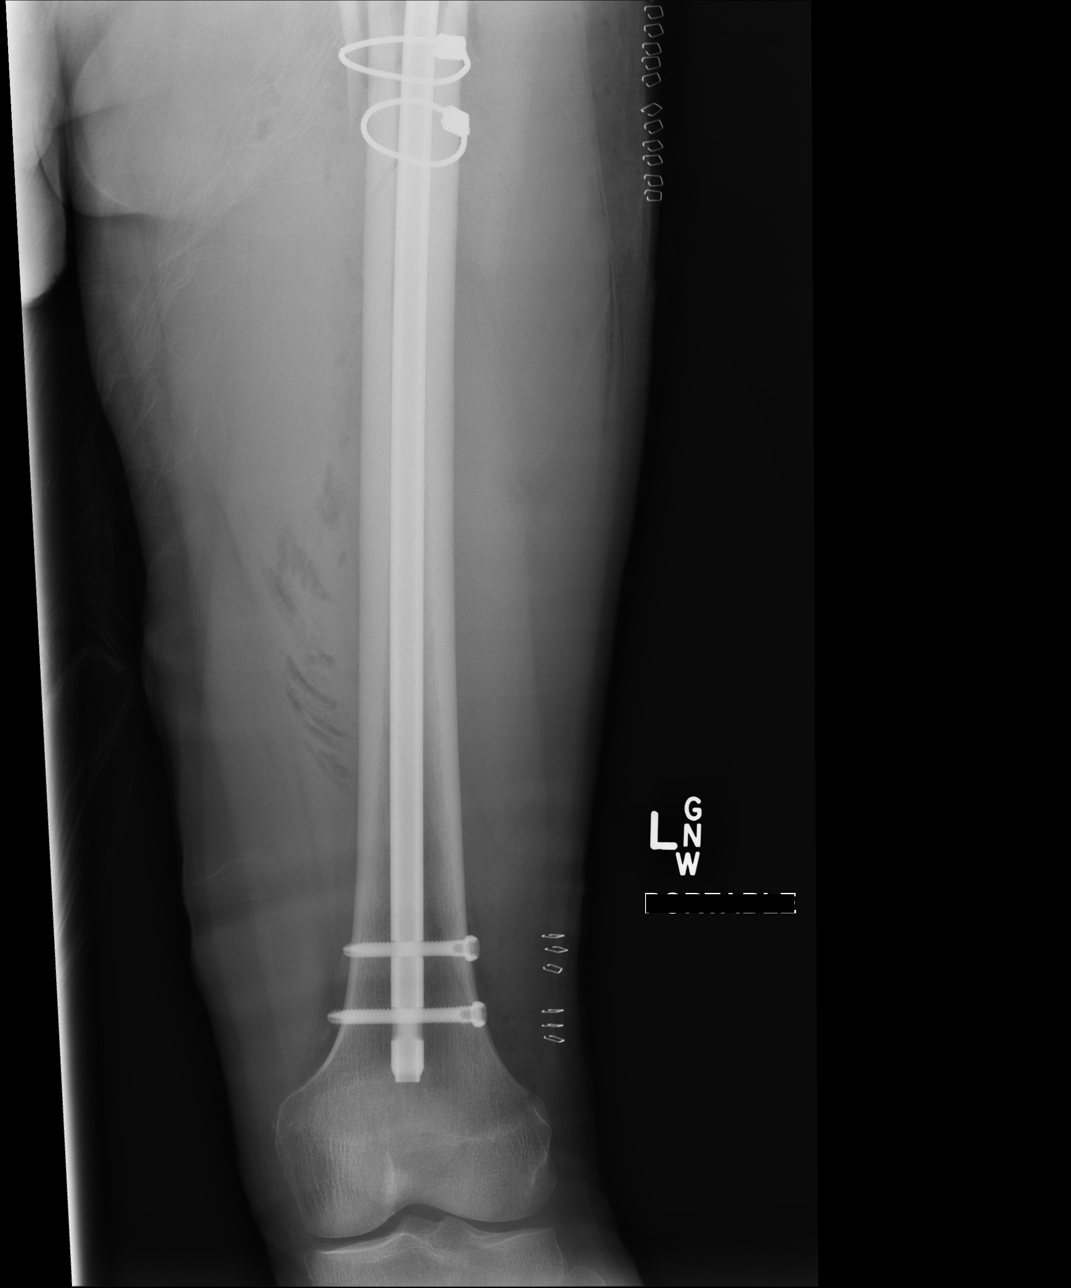

[xtable lateral (1 of 2)]
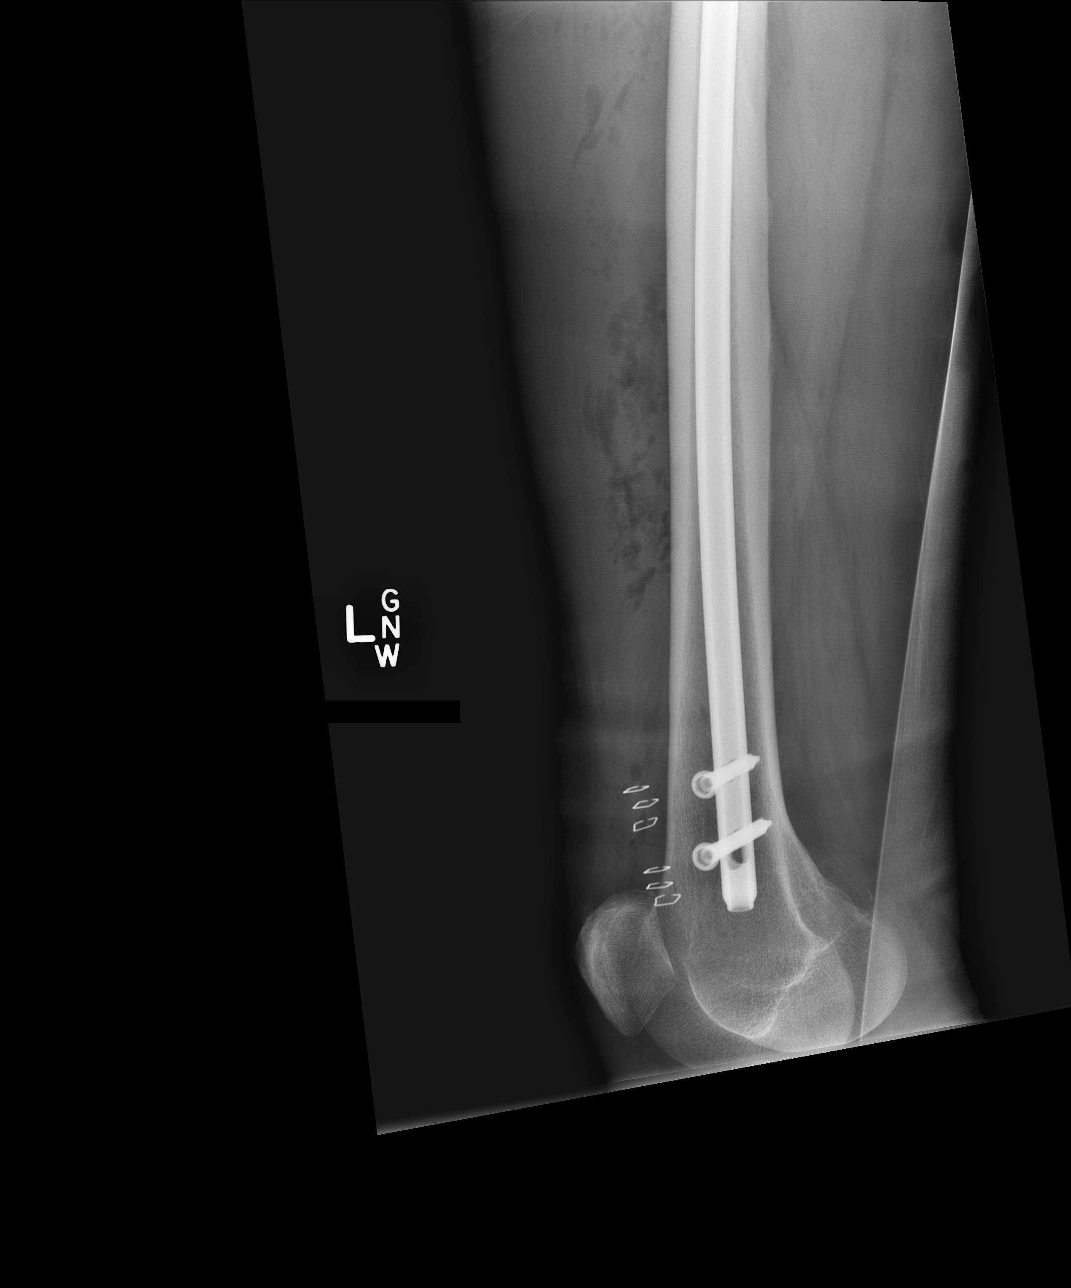

[xtable lateral (2 of 2)]
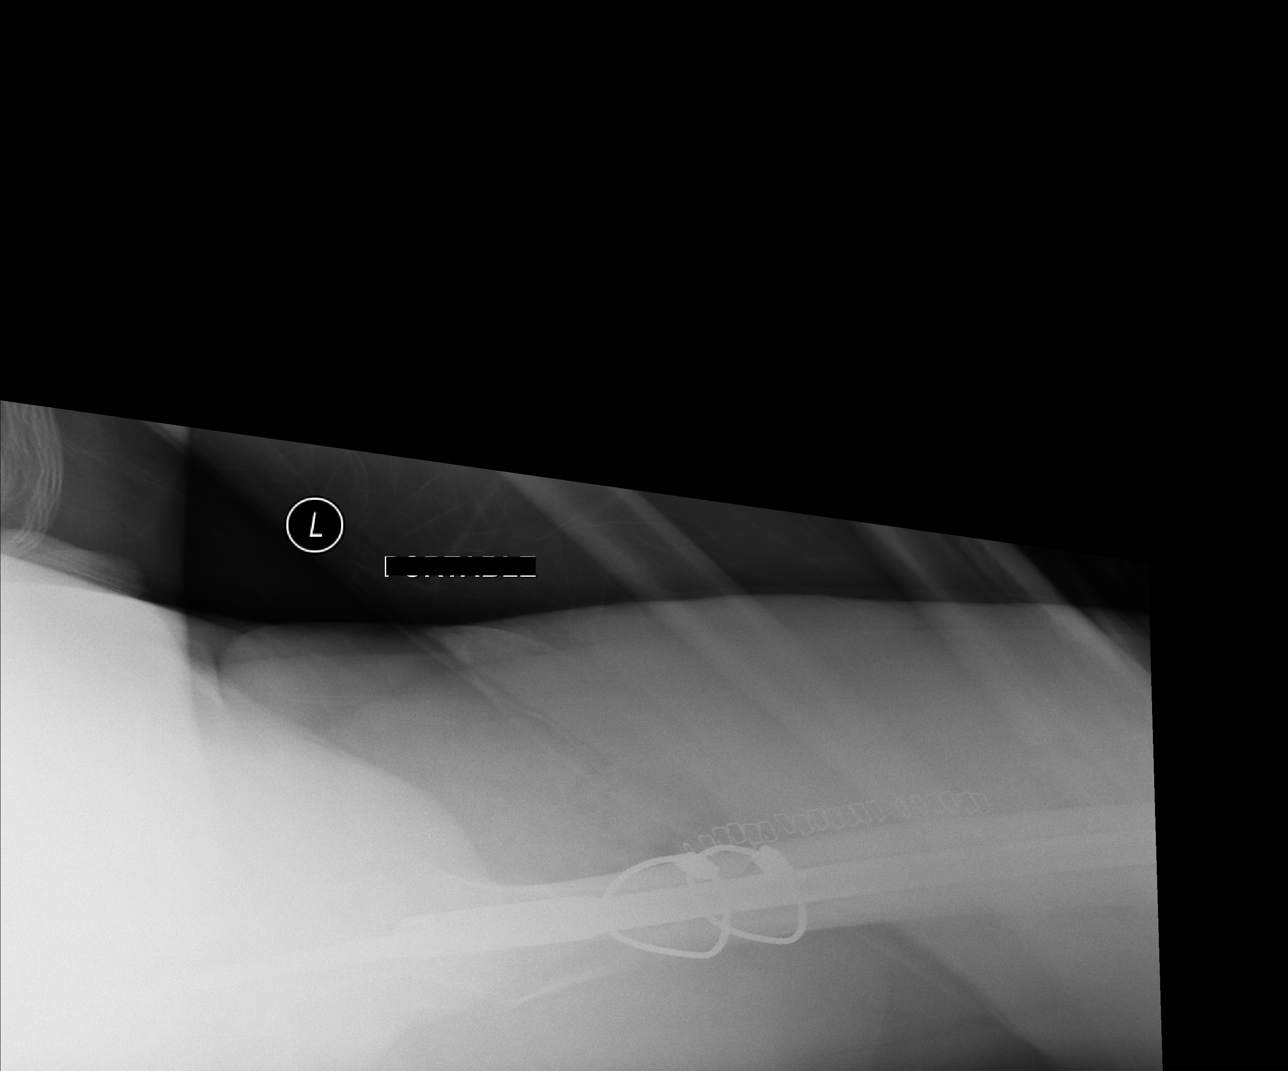

[AP (2 of 2)]
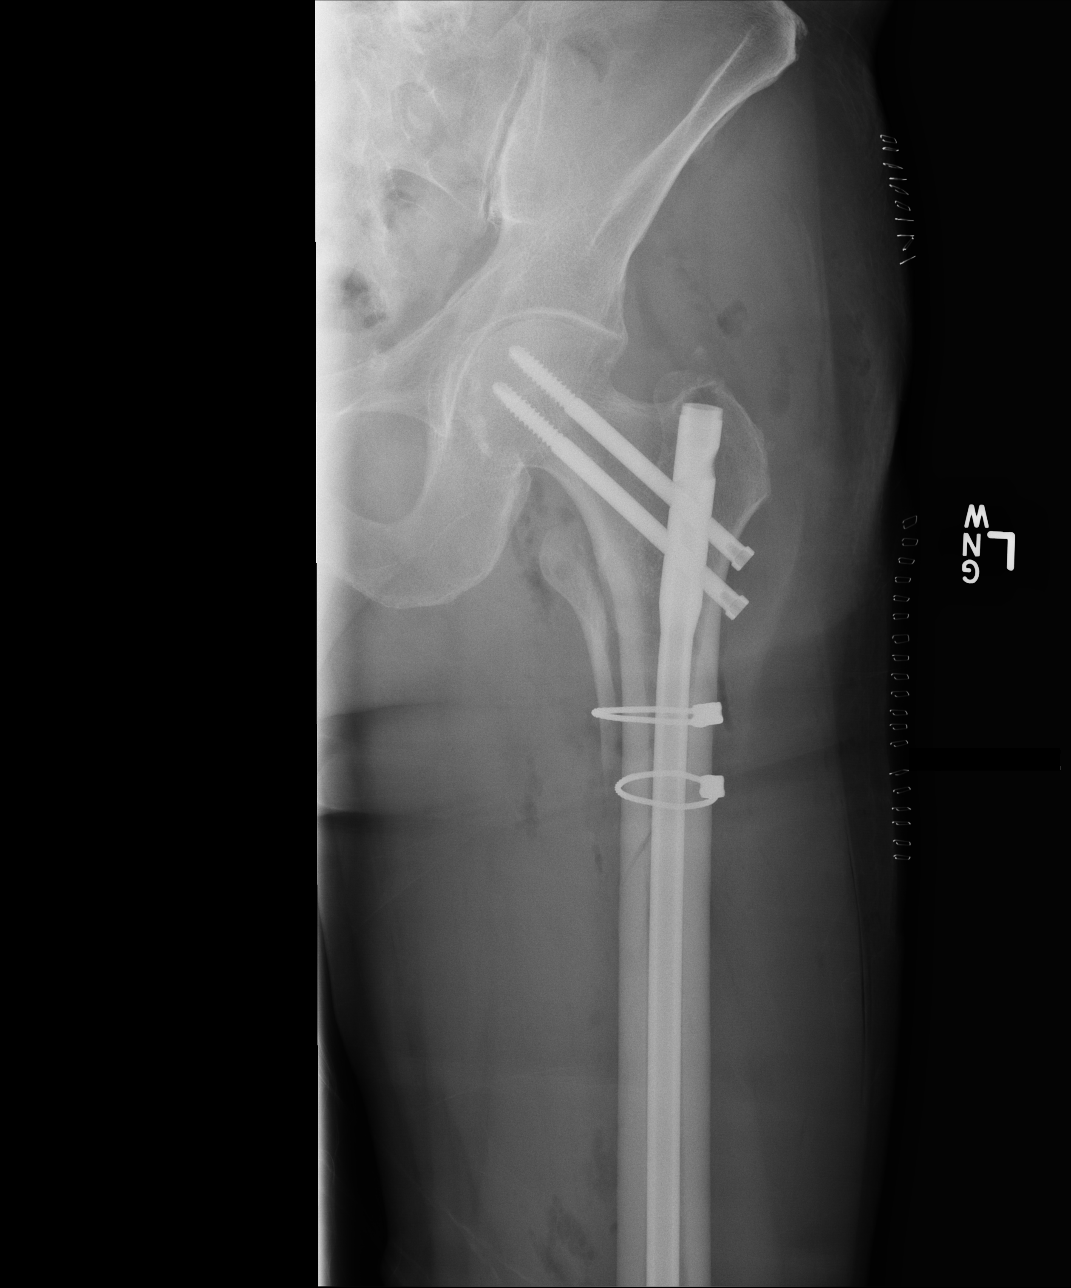

[4 of 4 positions shown; findings below may reference images not displayed]

FINDINGS: Soft tissue gas and skin staples are noted associated with expected
postoperative appearance of left femur ORIF with dynamic nail
fixation. Fracture fragments are in near anatomic alignment. No
evidence for hardware failure.
IMPRESSION: Expected postoperative appearance after left femur ORIF.

## 2016-10-30 ENCOUNTER — Other Ambulatory Visit: Payer: Self-pay | Admitting: Endocrinology

## 2016-12-14 NOTE — Progress Notes (Signed)
Patient ID: [name] [name], male   DOB: August 13, 1946, 71 y.o.   MRN: [id number]    Reason for Appointment: Diabetes follow-up   History of Present Illness   Diagnosis: Type 2 DIABETES MELITUS, date of diagnosis: 2009     Previous history: He has been treated with metformin monotherapy for several years with stable control Unable to exercise much because of chronic back pain  Recent history:    He has consistently had consistent control although he has gained weight probably from difficulty with not doing exercise No side effects with metformin A1c has been upper  normal and is similar at 5.7  He is checking some readings fasting and occasionally after lunch but not after evening meal recently Most of his reasoning are excellent  Unable to exercise much because of his continuing back problems Does follow his diet fairly well usually  With portion sizes and carbohydrates   Oral hypoglycemic drugs:   metformin ER 1500 mg       Side effects from medications: None       Monitors blood glucose:  about once a day or less .    Glucometer:  One Touch        Blood Glucose readings    Range from 79-98 FASTING Nonfasting after lunch 96-1 44 Overall average 107  Physical activity:  minimal Dietician visit: Most recent: 11/2008    Weight control:  Wt Readings from Last 3 Encounters:  12/15/16 163 lb (73.9 kg)  06/16/16 157 lb 6.4 oz (71.4 kg)  03/27/16 158 lb (71.7 kg)        Eye exam has been done annually Complications: None, has no symptoms of neuropathy  Diabetes labs:  Lab Results  Component Value Date   HGBA1C 5.5 06/16/2016   HGBA1C 5.6 12/12/2015   HGBA1C 5.6 12/12/2015   Lab Results  Component Value Date   MICROALBUR <0.7 12/12/2015   LDLCALC 46 12/12/2015   CREATININE 1.15 06/16/2016      Allergies as of 12/15/2016      Reactions   Niaspan [niacin Er] Hives      Medication List       Accurate as of 12/15/16  8:21 AM. Always use your most  recent med list.          amLODipine 5 MG tablet Commonly known as:  NORVASC Take 1 tablet by mouth  daily   aspirin EC 81 MG tablet Take 81 mg by mouth daily.   aspirin 325 MG tablet Take 1 tablet (325 mg total) by mouth daily.   atorvastatin 20 MG tablet Commonly known as:  LIPITOR Take 1 tablet by mouth  daily   fexofenadine 180 MG tablet Commonly known as:  ALLEGRA Take 180 mg by mouth daily.   folic acid 993 MCG tablet Commonly known as:  FOLVITE Take 800 mcg by mouth daily.   gabapentin 300 MG capsule Commonly known as:  NEURONTIN   levothyroxine 125 MCG tablet Commonly known as:  SYNTHROID, LEVOTHROID TAKE 1 TABLET BY MOUTH  DAILY   metFORMIN 500 MG 24 hr tablet Commonly known as:  GLUCOPHAGE-XR TAKE 3 TABLETS BY MOUTH AT  BEDTIME   multivitamin per tablet Take 1 tablet by mouth daily.   NEXIUM 24HR 20 MG capsule Generic drug:  esomeprazole Take 20 mg by mouth daily at 12 noon.   niacin 500 MG tablet Take 1 tablet (500 mg total) by mouth at bedtime.   ONETOUCH VERIO test  strip Generic drug:  glucose blood Check blood sugar once a  day   ramipril 10 MG capsule Commonly known as:  ALTACE Take 1 capsule by mouth  daily   traMADol 50 MG tablet Commonly known as:  ULTRAM Take 50 mg by mouth every 6 (six) hours as needed for severe pain (leg pain).       Allergies:  Allergies  Allergen Reactions  . Niaspan [Niacin Er] Hives    Past Medical History:  Diagnosis Date  . Allergy   . AMI (acute mesenteric ischemia) ([place])   . 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 Cataract   . Colon polyps   . Diabetes mellitus without complication ([place])   . Diverticulosis   . GERD (gastroesophageal reflux disease)   . Heart attack 2004  . Hemorrhoids   . HTN (hypertension)   . Hypothyroidism   . Inguinal hernia   . Lumbar scoliosis   . Rectal bleeding     Past Surgical History:  Procedure Laterality Date  . APPENDECTOMY  1966  . by-pass    . CATARACT  EXTRACTION     Bil  /   11/2012  . COLONOSCOPY    . CORONARY ANGIOPLASTY WITH STENT PLACEMENT    . CORONARY ARTERY BYPASS GRAFT  2004   x 5 / one stent  . FEMUR IM NAIL Left 05/25/2014   Procedure: INTRAMEDULLARY (IM) NAIL FEMORAL WITH CABLES;  Surgeon: Meredith Pel, MD;  Location: [facility];  Service: Orthopedics;  Laterality: Left;  . INGUINAL HERNIA REPAIR  1994   right side  . lumbar laminotomy  10/2009  . POLYPECTOMY      Family History  Problem Relation Age of Onset  . Heart disease Father   . Colon cancer Cousin     Social History:  reports that he quit smoking about 43 years ago. His smoking use included Cigarettes. He has a 10.00 pack-year smoking history. He has never used smokeless tobacco. He reports that he does not drink alcohol or use drugs.  Review of Systems:  Lipids: LDL Usually at target  With his treatment of Lipitor 20 mg daily.   Also has been taking niacin for low normal HDL    Lab Results  Component Value Date   CHOL 106 12/12/2015   HDL 41.50 12/12/2015   LDLCALC 46 12/12/2015   TRIG 88.0 12/12/2015   CHOLHDL 3 12/12/2015     HYPOTHYROIDISM: This is long-standing and has been on consistent regimen at home with Synthroid His dose was reduced to 125 g in 9/15 when TSH was low TSH was 3.1 with PCP Overall he feels fairly good  Lab Results  Component Value Date   TSH 1.60 06/16/2016   TSH 1.15 12/12/2015   TSH 2.37 01/04/2015   FREET4 1.03 01/04/2015   FREET4 1.56 05/17/2014   FREET4 1.19 03/08/2014    Hypertension:    Foot exam done in 4/18, normal findings  Is being followed by a spine surgeon    Examination:   BP 118/70   Pulse 68   Ht 5\' 8"  (1.727 m)   Wt 163 lb (73.9 kg)   BMI 24.78 kg/m   Body mass index is 24.78 kg/m.   Diabetic Foot Exam - Simple   Simple Foot Form Diabetic Foot exam was performed with the following findings:  Yes 12/15/2016  8:19 AM  Visual Inspection No deformities, no ulcerations, no other skin  breakdown bilaterally:  Yes Sensation Testing Intact to touch  and monofilament testing bilaterally:  Yes Pulse Check Posterior Tibialis and Dorsalis pulse intact bilaterally:  Yes Comments      ASSESSMENT/ PLAN:   Diabetes type 2 without obesity:  Blood glucose control is good with  fairly normal blood sugars at home except minimally increased after high carbohydrate meals  A1c again normal at 5.7, previously 5.5 Has been well controlled long-term with metformin ER 1500 mg a day He is quite compliant with his diet usually  However does not check readings after his main meal in the evening and this was discussed  He has limited ability to exercise and has gained weight  To continue same dose of metformin and follow-up in 6 months  No history of diabetic complications To have urine microalbumin checked again today  HYPERLIPIDEMIA: Currently being treated with niacin and Lipitor because of history of CAD  Needs follow-up -to be done today  Hypothyroidism:  His TSH was normal in February   [facility] 12/15/2016, 8:21 AM

## 2016-12-15 ENCOUNTER — Encounter: Payer: Self-pay | Admitting: Endocrinology

## 2016-12-15 ENCOUNTER — Ambulatory Visit (INDEPENDENT_AMBULATORY_CARE_PROVIDER_SITE_OTHER): Payer: Medicare Other | Admitting: Endocrinology

## 2016-12-15 VITALS — BP 118/70 | HR 68 | Ht 68.0 in | Wt 163.0 lb

## 2016-12-15 DIAGNOSIS — E119 Type 2 diabetes mellitus without complications: Secondary | ICD-10-CM | POA: Diagnosis not present

## 2016-12-15 LAB — POCT GLYCOSYLATED HEMOGLOBIN (HGB A1C): Hemoglobin A1C: 5.7

## 2016-12-15 LAB — URINALYSIS, ROUTINE W REFLEX MICROSCOPIC
Bilirubin Urine: NEGATIVE
Ketones, ur: NEGATIVE
Leukocytes, UA: NEGATIVE
Nitrite: NEGATIVE
RBC / HPF: NONE SEEN (ref 0–?)
Specific Gravity, Urine: <=1.005 — AB (ref 1.000–1.030)
Total Protein, Urine: NEGATIVE
Urine Glucose: NEGATIVE
Urobilinogen, UA: 0.2 (ref 0.0–1.0)
WBC, UA: NONE SEEN (ref 0–?)
pH: 5.5 (ref 5.0–8.0)

## 2016-12-15 LAB — COMPREHENSIVE METABOLIC PANEL
ALT: 24 U/L (ref 0–53)
AST: 18 U/L (ref 0–37)
Albumin: 4.4 g/dL (ref 3.5–5.2)
Alkaline Phosphatase: 65 U/L (ref 39–117)
BUN: 16 mg/dL (ref 6–23)
CO2: 29 mEq/L (ref 19–32)
Calcium: 9.9 mg/dL (ref 8.4–10.5)
Chloride: 103 mEq/L (ref 96–112)
Creatinine, Ser: 1.31 mg/dL (ref 0.40–1.50)
GFR: 57.32 mL/min — ABNORMAL LOW (ref >60.00)
Glucose, Bld: 112 mg/dL — ABNORMAL HIGH (ref 70–99)
Potassium: 4.8 mEq/L (ref 3.5–5.1)
Sodium: 137 mEq/L (ref 135–145)
Total Bilirubin: 0.3 mg/dL (ref 0.2–1.2)
Total Protein: 7.3 g/dL (ref 6.0–8.3)

## 2016-12-15 LAB — MICROALBUMIN / CREATININE URINE RATIO
Creatinine,U: 47.7 mg/dL
Microalb Creat Ratio: 1.5 mg/g (ref 0.0–30.0)
Microalb, Ur: <0.7 mg/dL (ref 0.0–1.9)

## 2016-12-15 LAB — LIPID PANEL
Cholesterol: 121 mg/dL (ref 0–200)
HDL: 40.8 mg/dL (ref >39.00)
LDL Cholesterol: 61 mg/dL (ref 0–99)
NonHDL: 80.2
Total CHOL/HDL Ratio: 3
Triglycerides: 96 mg/dL (ref 0.0–149.0)
VLDL: 19.2 mg/dL (ref 0.0–40.0)

## 2016-12-15 NOTE — Addendum Note (Signed)
Addended by: Nile Riggs on: 12/15/2016 09:19 AM   Modules accepted: Orders

## 2016-12-15 NOTE — Patient Instructions (Signed)
Check blood sugars on waking up    Also check blood sugars about 2 hours after a meal and do this after dinner/ Bfst meals by rotation  Recommended blood sugar levels on waking up is 90-130 and about 2 hours after meal is 130-160  Please bring your blood sugar monitor to each visit, thank you

## 2016-12-17 ENCOUNTER — Other Ambulatory Visit: Payer: Self-pay | Admitting: Rehabilitation

## 2016-12-17 DIAGNOSIS — M47816 Spondylosis without myelopathy or radiculopathy, lumbar region: Secondary | ICD-10-CM

## 2016-12-31 ENCOUNTER — Ambulatory Visit
Admission: RE | Admit: 2016-12-31 | Discharge: 2016-12-31 | Disposition: A | Discharge status: Home / Self Care | Payer: Medicare Other | Source: Ambulatory Visit | Attending: Rehabilitation | Admitting: Rehabilitation

## 2016-12-31 DIAGNOSIS — M47816 Spondylosis without myelopathy or radiculopathy, lumbar region: Secondary | ICD-10-CM

## 2016-12-31 MED ORDER — GADOBENATE DIMEGLUMINE 529 MG/ML IV SOLN
15.0000 mL | Freq: Once | INTRAVENOUS | Status: AC | PRN
  Administered 2016-12-31: 15 mL via INTRAVENOUS

## 2017-01-05 ENCOUNTER — Other Ambulatory Visit: Payer: Self-pay | Admitting: Cardiovascular Disease

## 2017-03-03 ENCOUNTER — Encounter: Payer: Self-pay | Admitting: Endocrinology

## 2017-03-03 LAB — HM DIABETES EYE EXAM

## 2017-03-20 ENCOUNTER — Encounter: Payer: Self-pay | Admitting: Cardiovascular Disease

## 2017-03-20 ENCOUNTER — Ambulatory Visit (INDEPENDENT_AMBULATORY_CARE_PROVIDER_SITE_OTHER): Payer: Medicare Other | Admitting: Cardiovascular Disease

## 2017-03-20 VITALS — BP 130/80 | HR 75 | Ht 69.0 in | Wt 166.6 lb

## 2017-03-20 DIAGNOSIS — I25810 Atherosclerosis of coronary artery bypass graft(s) without angina pectoris: Secondary | ICD-10-CM

## 2017-03-20 DIAGNOSIS — E782 Mixed hyperlipidemia: Secondary | ICD-10-CM | POA: Diagnosis not present

## 2017-03-20 NOTE — Progress Notes (Signed)
Cardiology Office Note Date:  03/20/2017   ID:  [name], [name] [birth year], MRN [id number]  PCP:  Aurea Graff.Marlou Sa, MD  Cardiologist:  Sherren Mocha, MD    Chief Complaint  Patient presents with  . 1 year follow up  . surgical clearance   History of Present Illness: [name] [name] is a 71 y.o. male who presents for  follow-up evaluation. The patient has a history of coronary artery disease, initially treated with primary PCI when he presented with an acute myocardial infarction in 2004. He ultimately was treated with multivessel CABG.  Multiple medical problems include hyperlipidemia, hypertension, and type 2 diabetes.  He is here alone today. Presents for preoperative cardiovascular evaluation. He is scheduled to have lumbar back surgery. Denies cardiac symptoms. Today, he denies symptoms of palpitations, chest pain, shortness of breath, orthopnea, PND, lower extremity edema, dizziness, or syncope. He is limited by back and leg pain and is no longer able to exercise because of these problems.    Past Medical History:  Diagnosis Date  . Allergy   . AMI (acute mesenteric ischemia) ([place])   . 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 Cataract   . Colon polyps   . Diabetes mellitus without complication ([place])   . Diverticulosis   . GERD (gastroesophageal reflux disease)   . Heart attack ([place]) 2004  . Hemorrhoids   . HTN (hypertension)   . Hypothyroidism   . Inguinal hernia   . Lumbar scoliosis   . Rectal bleeding     Past Surgical History:  Procedure Laterality Date  . APPENDECTOMY  1966  . by-pass    . CATARACT EXTRACTION     Bil  /   11/2012  . COLONOSCOPY    . CORONARY ANGIOPLASTY WITH STENT PLACEMENT    . CORONARY ARTERY BYPASS GRAFT  2004   x 5 / one stent  . FEMUR IM NAIL Left 05/25/2014   Procedure: INTRAMEDULLARY (IM) NAIL FEMORAL WITH CABLES;  Surgeon: Meredith Pel, MD;  Location: [facility];  Service: Orthopedics;  Laterality: Left;  . INGUINAL HERNIA REPAIR   1994   right side  . lumbar laminotomy  10/2009  . POLYPECTOMY      Current Outpatient Prescriptions  Medication Sig Dispense Refill  . amLODipine (NORVASC) 5 MG tablet TAKE 1 TABLET BY MOUTH  DAILY 30 tablet 0  . aspirin EC 81 MG tablet Take 81 mg by mouth daily.    Marland Kitchen atorvastatin (LIPITOR) 20 MG tablet TAKE 1 TABLET BY MOUTH  DAILY 30 tablet 0  . esomeprazole (NEXIUM 24HR) 20 MG capsule Take 20 mg by mouth daily at 12 noon.    . fexofenadine (ALLEGRA) 180 MG tablet Take 180 mg by mouth daily.      . folic acid (FOLVITE) 562 MCG tablet Take 800 mcg by mouth daily.     Marland Kitchen gabapentin (NEURONTIN) 300 MG capsule Take 300 mg by mouth as directed.     Marland Kitchen levothyroxine (SYNTHROID, LEVOTHROID) 125 MCG tablet TAKE 1 TABLET BY MOUTH  DAILY 90 tablet 1  . metFORMIN (GLUCOPHAGE-XR) 500 MG 24 hr tablet TAKE 3 TABLETS BY MOUTH AT  BEDTIME 270 tablet 3  . multivitamin (THERAGRAN) per tablet Take 1 tablet by mouth daily.      . niacin 500 MG tablet Take 1 tablet (500 mg total) by mouth at bedtime. 90 tablet 3  . ONETOUCH VERIO test strip Check blood sugar once a  day 100 each 2  .  ramipril (ALTACE) 10 MG capsule TAKE 1 CAPSULE BY MOUTH  DAILY 30 capsule 0  . traMADol (ULTRAM) 50 MG tablet Take 50 mg by mouth every 6 (six) hours as needed for severe pain (leg pain).     No current facility-administered medications for this visit.     Allergies:   Niaspan [niacin er]   Social History:  The patient  reports that he quit smoking about 43 years ago. His smoking use included Cigarettes. He has a 10.00 pack-year smoking history. He has never used smokeless tobacco. He reports that he does not drink alcohol or use drugs.   Family History:  The patient's family history includes Colon cancer in his cousin; Heart disease in his father.    ROS:  Please see the history of present illness.  Otherwise, review of systems is positive for leg pain.  All other systems are reviewed and negative.    PHYSICAL  EXAM: VS:  BP 130/80   Pulse 75   Ht 5\' 9"  (1.753 m)   Wt 166 lb 9.6 oz (75.6 kg)   BMI 24.60 kg/m  , BMI Body mass index is 24.6 kg/m. GEN: Well nourished, well developed, in no acute distress  HEENT: normal  Neck: no JVD, no masses. No carotid bruits Cardiac: RRR without murmur or gallop                Respiratory:  clear to auscultation bilaterally, normal work of breathing GI: soft, nontender, nondistended, + BS MS: no deformity or atrophy  Ext: no pretibial edema, pedal pulses 2+= bilaterally Skin: warm and dry, no rash Neuro:  Strength and sensation are intact Psych: euthymic mood, full affect  EKG:  EKG is ordered today. The ekg ordered today shows NSr 75 bpm, sinus arrhythmia, otherwise WNL  Recent Labs: 06/16/2016: TSH 1.60 12/15/2016: ALT 24; BUN 16; Creatinine, Ser 1.31; Potassium 4.8; Sodium 137   Lipid Panel     Component Value Date/Time   CHOL 121 12/15/2016 0827   TRIG 96.0 12/15/2016 0827   HDL 40.80 12/15/2016 0827   CHOLHDL 3 12/15/2016 0827   VLDL 19.2 12/15/2016 0827   LDLCALC 61 12/15/2016 0827      Wt Readings from Last 3 Encounters:  03/20/17 166 lb 9.6 oz (75.6 kg)  12/15/16 163 lb (73.9 kg)  06/16/16 157 lb 6.4 oz (71.4 kg)     Cardiac Studies Reviewed: Myocardial perfusion study 03/27/2016: Study Highlights    Nuclear stress EF: 56%.  There was no ST segment deviation noted during stress.  Defect 1: There is a small defect of mild severity present in the basal inferior and mid inferior location.  This is a low risk study.  The left ventricular ejection fraction is normal (55-65%).   Low risk stress nuclear study with inferior thinning but no ischemia; EF 56 with normal wall motion.     ASSESSMENT AND PLAN: 1.  CAD, native vessel, without symptoms of angina: Pharmacologic perfusion study last year is reviewed and was low risk without significant ischemia. The patient is stable and will continue on his current medical program which  includes aspirin, a statin drug, and an ACE inhibitor.  2. Hyperlipidemia: Lipids are at goal with a total cholesterol of 121, HDL 41, LDL 61, triglycerides 96. He is treated with atorvastatin 20 mg daily.  3. Hypertension: Blood pressure is at goal on a combination of amlodipine and ramipril  4. Type 2 diabetes: Most recent hemoglobin A1c is 5.7. Followed by Dr.  Kumar. Treated with metformin.  5. Preoperative cardiovascular assessment: The patient is at low cardiac risk of surgery. His stress test last year showed no major areas of ischemia. His LVEF is normal. He is having no anginal symptoms. His EKG is essentially normal. He can proceed without further testing. It is reasonable for him to hold aspirin for 7 days prior to surgery.  Current medicines are reviewed with the patient today.  The patient does not have concerns regarding medicines.  Labs/ tests ordered today include:   Orders Placed This Encounter  Procedures  . EKG 12-Lead    Disposition:   FU one year  Signed, Sherren Mocha, MD  03/20/2017 10:24 AM    [facility] [address], [place], [place]  [address] Phone: [phone]; Fax: [phone]

## 2017-03-20 NOTE — Patient Instructions (Signed)

## 2017-05-18 DIAGNOSIS — E118 Type 2 diabetes mellitus with unspecified complications: Secondary | ICD-10-CM | POA: Insufficient documentation

## 2017-05-25 DIAGNOSIS — D509 Iron deficiency anemia, unspecified: Secondary | ICD-10-CM | POA: Insufficient documentation

## 2017-06-04 ENCOUNTER — Other Ambulatory Visit: Payer: Self-pay | Admitting: Endocrinology

## 2017-06-12 ENCOUNTER — Other Ambulatory Visit: Payer: Self-pay | Admitting: Endocrinology

## 2017-06-14 NOTE — Progress Notes (Signed)
Patient ID: [name] [name], male   DOB: Dec 04, 1945, 71 y.o.   MRN: [id number]    Reason for Appointment: Diabetes follow-up   History of Present Illness   Diagnosis: Type 2 DIABETES MELITUS, date of diagnosis: 2009     Previous history: He has been treated with metformin monotherapy for several years with stable control Unable to exercise much because of chronic back pain  Recent history:    He has consistently had consistent control with metformin alone  No side effects with metformin A1c has been upper  normal and is better than expected at 5.3  He is not eating much because of various intercurrent illnesses and hospitalizations and has lost a significant amount of weight from decreased appetite Despite being on a steroid dose taper for the fourth day today his blood sugars do not appear to be higher as of yesterday Also blood sugars are not high during his hospitalization last month  Unable to exercise much because of his continuing back problems and recent gallbladder surgery Does follow his diet fairly well usually   Oral hypoglycemic drugs:   metformin ER 1500 mg       Side effects from medications: None       Monitors blood glucose:  about once a day or less .    Glucometer:  One Touch        Blood Glucose readings    Range from 90-1 46 with AVERAGE 114 Has only one reading above 140 after lunch last Friday  Physical activity:  minimal Dietician visit: Most recent: 11/2008    Weight control:  Wt Readings from Last 3 Encounters:  06/15/17 149 lb 12.8 oz (67.9 kg)  03/20/17 166 lb 9.6 oz (75.6 kg)  12/15/16 163 lb (73.9 kg)        Eye exam has been done annually Complications: None, has no symptoms of neuropathy  Diabetes labs:  Lab Results  Component Value Date   HGBA1C 5.7 12/15/2016   HGBA1C 5.5 06/16/2016   HGBA1C 5.6 12/12/2015   Lab Results  Component Value Date   MICROALBUR <0.7 12/15/2016   LDLCALC 61 12/15/2016   CREATININE 1.31  12/15/2016      Allergies as of 06/15/2017      Reactions   Sulfa Antibiotics Rash   Niaspan [niacin Er] Hives      Medication List       Accurate as of 06/15/17  8:17 AM. Always use your most recent med list.          amLODipine 5 MG tablet Commonly known as:  NORVASC TAKE 1 TABLET BY MOUTH  DAILY   aspirin EC 81 MG tablet Take 81 mg by mouth daily.   atorvastatin 20 MG tablet Commonly known as:  LIPITOR TAKE 1 TABLET BY MOUTH  DAILY   fexofenadine 180 MG tablet Commonly known as:  ALLEGRA Take 180 mg by mouth daily.   folic acid 676 MCG tablet Commonly known as:  FOLVITE Take 800 mcg by mouth daily.   gabapentin 300 MG capsule Commonly known as:  NEURONTIN Take 300 mg by mouth as directed.   levothyroxine 125 MCG tablet Commonly known as:  SYNTHROID, LEVOTHROID TAKE 1 TABLET BY MOUTH  DAILY   metFORMIN 500 MG 24 hr tablet Commonly known as:  GLUCOPHAGE-XR TAKE 3 TABLETS BY MOUTH AT  BEDTIME   multivitamin per tablet Take 1 tablet by mouth daily.   NEXIUM 24HR 20 MG capsule Generic drug:  esomeprazole Take 20 mg by mouth daily at 12 noon.   niacin 500 MG tablet Take 1 tablet (500 mg total) by mouth at bedtime.   ONETOUCH VERIO test strip Generic drug:  glucose blood CHECK BLOOD SUGAR ONCE A  DAY   predniSONE 10 MG (21) Tbpk tablet Commonly known as:  STERAPRED UNI-PAK 21 TAB TAKE 6 TABLETS ON DAY 1 AS DIRECTED ON PACKAGE AND DECREASE BY 1 TAB EACH DAY FOR A TOTAL OF 6 DAYS   ramipril 10 MG capsule Commonly known as:  ALTACE TAKE 1 CAPSULE BY MOUTH  DAILY   traMADol 50 MG tablet Commonly known as:  ULTRAM Take 50 mg by mouth every 6 (six) hours as needed for severe pain (leg pain).       Allergies:  Allergies  Allergen Reactions  . Sulfa Antibiotics Rash  . Niaspan [Niacin Er] Hives    Past Medical History:  Diagnosis Date  . Allergy   . AMI (acute mesenteric ischemia) ([place])   . Arthritis, lumbar spine   . Cataract   . Colon  polyps   . Diabetes mellitus without complication ([place])   . Diverticulosis   . GERD (gastroesophageal reflux disease)   . Heart attack ([place]) 2004  . Hemorrhoids   . HTN (hypertension)   . Hypothyroidism   . Inguinal hernia   . Lumbar scoliosis   . Rectal bleeding     Past Surgical History:  Procedure Laterality Date  . APPENDECTOMY  1966  . by-pass    . CATARACT EXTRACTION     Bil  /   11/2012  . COLONOSCOPY    . CORONARY ANGIOPLASTY WITH STENT PLACEMENT    . CORONARY ARTERY BYPASS GRAFT  2004   x 5 / one stent  . FEMUR IM NAIL Left 05/25/2014   Procedure: INTRAMEDULLARY (IM) NAIL FEMORAL WITH CABLES;  Surgeon: Meredith Pel, MD;  Location: [facility];  Service: Orthopedics;  Laterality: Left;  . INGUINAL HERNIA REPAIR  1994   right side  . lumbar laminotomy  10/2009  . POLYPECTOMY      Family History  Problem Relation Age of Onset  . Heart disease Father   . Colon cancer Cousin     Social History:  reports that he quit smoking about 43 years ago. His smoking use included Cigarettes. He has a 10.00 pack-year smoking history. He has never used smokeless tobacco. He reports that he does not drink alcohol or use drugs.  Review of Systems:  Lipids: LDL Usually at target  With his treatment of Lipitor 20 mg daily.   Also has been taking niacin for low normal HDL    Lab Results  Component Value Date   CHOL 121 12/15/2016   HDL 40.80 12/15/2016   LDLCALC 61 12/15/2016   TRIG 96.0 12/15/2016   CHOLHDL 3 12/15/2016     HYPOTHYROIDISM: This is long-standing and has been on consistent regimen at home with Synthroid His dose was reduced to 125 g in 9/15 when TSH was low TSH was 3.1 with PCP Done in 2/18 Overall he feels fairly good  Lab Results  Component Value Date   TSH 1.60 06/16/2016   TSH 1.15 12/12/2015   TSH 2.37 01/04/2015   FREET4 1.03 01/04/2015   FREET4 1.56 05/17/2014   FREET4 1.19 03/08/2014    Hypertension:    Foot exam done in 4/18, normal  findings      Examination:   BP 126/78   Pulse 75  Ht 5\' 5"  (1.651 m)   Wt 149 lb 12.8 oz (67.9 kg)   SpO2 97%   BMI 24.93 kg/m   Body mass index is 24.93 kg/m.     ASSESSMENT/ PLAN:   Diabetes type 2 without obesity:  Blood glucose control is good with Again normal A1c See history of present illness for discussion of current diabetes management, blood sugar patterns and problems identified  His blood sugars are probably slightly higher the last couple of days because of getting steroid dose but still not high enough to change his treatment.   Metformin alone has controlled his blood sugars consistently He has lost a significant amount of weight from intercurrent illnesses and this may be reducing his insulin resistance  Reminded him to check blood sugar more consistently while he is on steroids and call if unusually high To continue same dose of metformin and follow-up in 3 months  Upper normal creatinine: Recent level was 1.35 and he will continue to follow-up with PCP  HYPERLIPIDEMIA: Last LDL 61 Needs follow-up -to be done on the next visit   Hypothyroidism:  His TSH was normal in February and will recheck on the next visit   Fuad Forget 06/15/2017, 8:17 AM

## 2017-06-15 ENCOUNTER — Encounter: Payer: Self-pay | Admitting: Endocrinology

## 2017-06-15 ENCOUNTER — Ambulatory Visit (INDEPENDENT_AMBULATORY_CARE_PROVIDER_SITE_OTHER): Payer: Medicare Other | Admitting: Endocrinology

## 2017-06-15 VITALS — BP 126/78 | HR 75 | Ht 65.0 in | Wt 149.8 lb

## 2017-06-15 DIAGNOSIS — E782 Mixed hyperlipidemia: Secondary | ICD-10-CM

## 2017-06-15 DIAGNOSIS — E119 Type 2 diabetes mellitus without complications: Secondary | ICD-10-CM

## 2017-06-15 DIAGNOSIS — E063 Autoimmune thyroiditis: Secondary | ICD-10-CM

## 2017-06-15 LAB — POCT GLYCOSYLATED HEMOGLOBIN (HGB A1C): Hemoglobin A1C: 5.3

## 2017-07-13 ENCOUNTER — Other Ambulatory Visit: Payer: Self-pay | Admitting: Cardiovascular Disease

## 2017-07-28 ENCOUNTER — Encounter: Payer: Self-pay | Admitting: Cardiovascular Disease

## 2017-07-28 ENCOUNTER — Ambulatory Visit: Payer: Medicare Other | Admitting: Cardiovascular Disease

## 2017-07-28 VITALS — BP 120/80 | HR 76 | Ht 67.0 in | Wt 159.6 lb

## 2017-07-28 DIAGNOSIS — R079 Chest pain, unspecified: Secondary | ICD-10-CM

## 2017-07-28 DIAGNOSIS — R002 Palpitations: Secondary | ICD-10-CM

## 2017-07-28 NOTE — Patient Instructions (Signed)
Medication Instructions:  Your provider recommends that you continue on your current medications as directed. Please refer to the Current Medication list given to you today.    Labwork: TODAY: BMET, Magnesium  Testing/Procedures: Your physician has recommended that you wear an event monitor. Event monitors are medical devices that record the heart's electrical activity. Doctors most often [place] these monitors to diagnose arrhythmias. Arrhythmias are problems with the speed or rhythm of the heartbeat. The monitor is a small, portable device. You can wear one while you do your normal daily activities. This is usually used to diagnose what is causing palpitations/syncope (passing out).  Follow-Up: Your provider wants you to follow-up in: 1 year with Dr. Burt Knack. You will receive a reminder letter in the mail two months in advance. If you don't receive a letter, please call our office to schedule the follow-up appointment.    Any Other Special Instructions Will Be Listed Below (If Applicable).     If you need a refill on your cardiac medications before your next appointment, please call your pharmacy.

## 2017-07-28 NOTE — Progress Notes (Signed)
Cardiology Office Note Date:  07/28/2017   ID:  [name] [name], [name] [name] December 23, 1945, MRN [id number]  PCP:  Aurea Graff.Marlou Sa, MD  Cardiologist:  Janne Lab, MD    Chief Complaint  Patient presents with  . Coronary Artery Disease     History of Present Illness: [name] [name] is a 71 y.o. male who presents for follow-up evaluation. The patient has a history of coronary artery disease, initially treated with primary PCI when he presented with an acute myocardial infarction in 2004. He ultimately was treated with multivessel CABG.  Multiple medical problems include hyperlipidemia, hypertension, and type 2 diabetes.  He was last seen in July 2018 for pre-operative clearance for back surgery. He did fine with surgery without cardiac complication. He then had to undergo cholecystectomy in September 2018, also with an uncomplicated post-op course.   He has been doing well, but last week had an episode of palpitations where he experienced hard heart beats and chest pain for a few minutes while resting in bed. Since that time he's had sudden onset and then complete resolution of chest pain lasting only a few seconds. Otherwise he is doing ok. No exertional chest discomfort. No lightheadedness or diaphoresis. No dyspnea.   Past Medical History:  Diagnosis Date  . Allergy   . AMI (acute mesenteric ischemia) ([place])   . Arthritis, lumbar spine   . Cataract   . Colon polyps   . Diabetes mellitus without complication ([place])   . Diverticulosis   . GERD (gastroesophageal reflux disease)   . Heart attack ([place]) 2004  . Hemorrhoids   . HTN (hypertension)   . Hypothyroidism   . Inguinal hernia   . Lumbar scoliosis   . Rectal bleeding     Past Surgical History:  Procedure Laterality Date  . APPENDECTOMY  1966  . by-pass    . CATARACT EXTRACTION     Bil  /   11/2012  . COLONOSCOPY    . CORONARY ANGIOPLASTY WITH STENT PLACEMENT    . CORONARY ARTERY BYPASS GRAFT  2004   x 5 / one stent  .  FEMUR IM NAIL Left 05/25/2014   Procedure: INTRAMEDULLARY (IM) NAIL FEMORAL WITH CABLES;  Surgeon: Meredith Pel, MD;  Location: [facility];  Service: Orthopedics;  Laterality: Left;  . INGUINAL HERNIA REPAIR  1994   right side  . lumbar laminotomy  10/2009  . POLYPECTOMY      Current Outpatient Medications  Medication Sig Dispense Refill  . alfuzosin (UROXATRAL) 10 MG 24 hr tablet Take 10 mg by mouth at bedtime.    Marland Kitchen amLODipine (NORVASC) 5 MG tablet TAKE 1 TABLET BY MOUTH  DAILY 30 tablet 7  . aspirin EC 81 MG tablet Take 81 mg by mouth daily.    Marland Kitchen atorvastatin (LIPITOR) 20 MG tablet TAKE 1 TABLET BY MOUTH  DAILY 30 tablet 7  . esomeprazole (NEXIUM 24HR) 20 MG capsule Take 20 mg by mouth daily at 12 noon.    . fexofenadine (ALLEGRA) 180 MG tablet Take 180 mg by mouth daily.      . folic acid (FOLVITE) 563 MCG tablet Take 800 mcg by mouth daily.     Marland Kitchen gabapentin (NEURONTIN) 300 MG capsule Take 300 mg by mouth as directed.     Marland Kitchen levothyroxine (SYNTHROID, LEVOTHROID) 125 MCG tablet TAKE 1 TABLET BY MOUTH  DAILY 90 tablet 1  . metFORMIN (GLUCOPHAGE-XR) 500 MG 24 hr tablet TAKE 3 TABLETS BY MOUTH AT  BEDTIME 270  tablet 3  . multivitamin (THERAGRAN) per tablet Take 1 tablet by mouth daily.      . niacin 500 MG tablet Take 1 tablet (500 mg total) by mouth at bedtime. 90 tablet 3  . ONETOUCH VERIO test strip CHECK BLOOD SUGAR ONCE A  DAY 100 each 2  . predniSONE (STERAPRED UNI-PAK 21 TAB) 10 MG (21) TBPK tablet TAKE 6 TABLETS ON DAY 1 AS DIRECTED ON PACKAGE AND DECREASE BY 1 TAB EACH DAY FOR A TOTAL OF 6 DAYS  0  . ramipril (ALTACE) 10 MG capsule TAKE 1 CAPSULE BY MOUTH  DAILY 30 capsule 7  . traMADol (ULTRAM) 50 MG tablet Take 50 mg by mouth every 6 (six) hours as needed for severe pain (leg pain).     No current facility-administered medications for this visit.     Allergies:   Sulfamethoxazole-trimethoprim; Sulfa antibiotics; and Niaspan [niacin er]   Social History:  The patient  reports  that he quit smoking about 43 years ago. His smoking use included cigarettes. He has a 10.00 pack-year smoking history. he has never used smokeless tobacco. He reports that he does not drink alcohol or use drugs.   Family History:  The patient's family history includes Colon cancer in his cousin; Heart disease in his father.    ROS:  Please see the history of present illness.  Otherwise, review of systems is positive for back pain.  All other systems are reviewed and negative.    PHYSICAL EXAM: VS:  BP 120/80   Pulse 76   Ht 5\' 7"  (1.702 m)   Wt 159 lb 9.6 oz (72.4 kg)   BMI 25.00 kg/m  , BMI Body mass index is 25 kg/m. GEN: Well nourished, well developed, in no acute distress  HEENT: normal  Neck: no JVD, no masses. No carotid bruits Cardiac: RRR without murmur or gallop    Respiratory:  clear to auscultation bilaterally, normal work of breathing GI: soft, nontender, nondistended, + BS MS: no deformity or atrophy  Ext: no pretibial edema, pedal pulses 2+= bilaterally Skin: warm and dry, no rash Neuro:  Strength and sensation are intact Psych: euthymic mood, full affect  EKG:  EKG is not ordered today. The ekg ordered today shows NSR with sinus arrhythmia HR 73 bpm, otherwise within normal limits  Recent Labs: 12/15/2016: ALT 24; BUN 16; Creatinine, Ser 1.31; Potassium 4.8; Sodium 137   Lipid Panel     Component Value Date/Time   CHOL 121 12/15/2016 0827   TRIG 96.0 12/15/2016 0827   HDL 40.80 12/15/2016 0827   CHOLHDL 3 12/15/2016 0827   VLDL 19.2 12/15/2016 0827   LDLCALC 61 12/15/2016 0827      Wt Readings from Last 3 Encounters:  07/28/17 159 lb 9.6 oz (72.4 kg)  06/15/17 149 lb 12.8 oz (67.9 kg)  03/20/17 166 lb 9.6 oz (75.6 kg)     Cardiac Studies Reviewed: Stress Test 03-27-2016: Study Highlights    Nuclear stress EF: 56%.  There was no ST segment deviation noted during stress.  Defect 1: There is a small defect of mild severity present in the basal  inferior and mid inferior location.  This is a low risk study.  The left ventricular ejection fraction is normal (55-65%).   Low risk stress nuclear study with inferior thinning but no ischemia; EF 56 with normal wall motion.    ASSESSMENT AND PLAN: 1.  Coronary artery disease, native vessel: Patient with some atypical symptoms of angina.  Symptoms sound fairly classic for symptomatic PVCs.  They are occurring at rest and are fleeting in nature.  I recommended an event monitor.  Current medicines will be continued.  The patient should continue on his current medical program which includes aspirin for antiplatelet therapy, high intensity statin drug with atorvastatin, an ACE inhibitor, and amlodipine.  Could consider a beta-blocker if palpitations persist or recur.  2.  Hyperlipidemia: Treated with atorvastatin 20 mg daily  3.  Hypertension: Blood pressure is very well controlled on current therapy with ramipril and amlodipine.  Current medicines are reviewed with the patient today.  The patient does not have concerns regarding medicines.  Labs/ tests ordered today include:   Orders Placed This Encounter  Procedures  . Basic Metabolic Panel (BMET)  . Magnesium  . CARDIAC EVENT MONITOR  . EKG 12-Lead    Disposition:   FU 1 year  Signed, Janne Lab, MD  07/28/2017 5:44 PM    [facility] [address], [place], [place]  [address] Phone: [phone]; Fax: [phone]

## 2017-07-29 LAB — BASIC METABOLIC PANEL
BUN/Creatinine Ratio: 13 (ref 10–24)
BUN: 16 mg/dL (ref 8–27)
CO2: 24 mmol/L (ref 20–29)
Calcium: 9.1 mg/dL (ref 8.6–10.2)
Chloride: 103 mmol/L (ref 96–106)
Creatinine, Ser: 1.19 mg/dL (ref 0.76–1.27)
GFR calc Af Amer: 71 mL/min/{1.73_m2} (ref >59)
GFR calc non Af Amer: 61 mL/min/{1.73_m2} (ref >59)
Glucose: 108 mg/dL — ABNORMAL HIGH (ref 65–99)
Potassium: 4.3 mmol/L (ref 3.5–5.2)
Sodium: 139 mmol/L (ref 134–144)

## 2017-07-29 LAB — MAGNESIUM: Magnesium: 2 mg/dL (ref 1.6–2.3)

## 2017-08-12 ENCOUNTER — Ambulatory Visit (INDEPENDENT_AMBULATORY_CARE_PROVIDER_SITE_OTHER): Payer: Medicare Other

## 2017-08-12 DIAGNOSIS — R002 Palpitations: Secondary | ICD-10-CM

## 2017-09-05 ENCOUNTER — Other Ambulatory Visit: Payer: Self-pay | Admitting: Endocrinology

## 2017-09-15 ENCOUNTER — Ambulatory Visit: Payer: Medicare Other | Admitting: Endocrinology

## 2017-09-20 ENCOUNTER — Encounter: Payer: Self-pay | Admitting: Cardiovascular Disease

## 2017-11-06 ENCOUNTER — Other Ambulatory Visit: Payer: Self-pay | Admitting: Endocrinology

## 2017-11-23 ENCOUNTER — Telehealth: Payer: Self-pay | Admitting: *Deleted

## 2017-11-23 NOTE — Telephone Encounter (Signed)
   [facility] Pre-operative Risk Assessment    Request for surgical clearance:  1. What type of surgery is being performed? Urolift   2. When is this surgery scheduled? 11/30/17   3. What type of clearance is required (medical clearance vs. Pharmacy clearance to hold med vs. Both)? Anesthesia clearance  4. Are there any medications that need to be held prior to surgery and how long?no medications requested   5. Practice name and name of physician performing surgery? [facility]-Dr. McKenzie/[facility]-Dr. Yisroel Ramming    6. What is your office phone and fax number? Ph: [phone], fax: [phone].   7. Anesthesia type (None, local, MAC, general) ? "in-office general anesthesia"  Faxed copy requests that most recent echo and ov notes be faxed with clearance information.  Call [facility] at [phone] with any questions. Rodman Key 11/23/2017, 12:54 PM  _________________________________________________________________   (provider comments below)

## 2017-11-24 NOTE — Telephone Encounter (Signed)
   Primary Cardiologist: Sherren Mocha, MD  Chart reviewed as part of pre-operative protocol coverage. Patient was contacted 11/24/2017 in reference to pre-operative risk assessment for pending surgery as outlined below.  [name] [name] was last seen on 07/28/2017 by Dr. Burt Knack.  Since that day, [name] [name] has done well.  He exercises regularly without any difficulty.  He does not get chest pain or shortness of breath with activity.  They told him he could take all of his medications including aspirin.   Therefore, based on ACC/AHA guidelines, the patient would be at acceptable risk for the planned procedure without further cardiovascular testing.   I will route this recommendation to the requesting party via Epic fax function and remove from pre-op pool.  Please call with questions.  Rosaria Ferries, PA-C 11/24/2017, 4:46 PM

## 2017-11-24 NOTE — Telephone Encounter (Signed)
Clearance faxed via Epic.  

## 2017-12-14 ENCOUNTER — Encounter: Payer: Self-pay | Admitting: Endocrinology

## 2017-12-14 ENCOUNTER — Ambulatory Visit: Payer: Medicare Other | Admitting: Endocrinology

## 2017-12-14 VITALS — BP 112/70 | HR 65 | Ht 67.0 in | Wt 155.0 lb

## 2017-12-14 DIAGNOSIS — E782 Mixed hyperlipidemia: Secondary | ICD-10-CM | POA: Diagnosis not present

## 2017-12-14 DIAGNOSIS — E063 Autoimmune thyroiditis: Secondary | ICD-10-CM

## 2017-12-14 DIAGNOSIS — E119 Type 2 diabetes mellitus without complications: Secondary | ICD-10-CM | POA: Diagnosis not present

## 2017-12-14 LAB — LIPID PANEL
Cholesterol: 107 mg/dL (ref 0–200)
HDL: 36.5 mg/dL — ABNORMAL LOW (ref >39.00)
LDL Cholesterol: 49 mg/dL (ref 0–99)
NonHDL: 70.13
Total CHOL/HDL Ratio: 3
Triglycerides: 107 mg/dL (ref 0.0–149.0)
VLDL: 21.4 mg/dL (ref 0.0–40.0)

## 2017-12-14 LAB — COMPREHENSIVE METABOLIC PANEL
ALT: 15 U/L (ref 0–53)
AST: 15 U/L (ref 0–37)
Albumin: 4.1 g/dL (ref 3.5–5.2)
Alkaline Phosphatase: 72 U/L (ref 39–117)
BUN: 20 mg/dL (ref 6–23)
CO2: 26 mEq/L (ref 19–32)
Calcium: 9.2 mg/dL (ref 8.4–10.5)
Chloride: 102 mEq/L (ref 96–112)
Creatinine, Ser: 1.31 mg/dL (ref 0.40–1.50)
GFR: 57.16 mL/min — ABNORMAL LOW (ref >60.00)
Glucose, Bld: 88 mg/dL (ref 70–99)
Potassium: 4.5 mEq/L (ref 3.5–5.1)
Sodium: 137 mEq/L (ref 135–145)
Total Bilirubin: 0.3 mg/dL (ref 0.2–1.2)
Total Protein: 7.5 g/dL (ref 6.0–8.3)

## 2017-12-14 LAB — T4, FREE: Free T4: 1.02 ng/dL (ref 0.60–1.60)

## 2017-12-14 LAB — MICROALBUMIN / CREATININE URINE RATIO
Creatinine,U: 59.1 mg/dL
Microalb Creat Ratio: 1.2 mg/g (ref 0.0–30.0)
Microalb, Ur: <0.7 mg/dL (ref 0.0–1.9)

## 2017-12-14 LAB — POCT GLYCOSYLATED HEMOGLOBIN (HGB A1C): Hemoglobin A1C: 5.4

## 2017-12-14 LAB — TSH: TSH: 6.8 u[IU]/mL — ABNORMAL HIGH (ref 0.35–4.50)

## 2017-12-14 NOTE — Progress Notes (Signed)
Patient ID: [name] [name], male   DOB: Apr 20, 1946, 72 y.o.   MRN: [id number]    Reason for Appointment: Diabetes follow-up   History of Present Illness   Diagnosis: Type 2 DIABETES MELITUS, date of diagnosis: 2009     Previous history: He has been treated with metformin monotherapy for several years with stable control Unable to exercise much because of chronic back pain  Recent history:    He has consistently had consistent control with metformin alone, now taking 1500 mg  No GI side effects with metformin A1c has been better than expected for his vomiting is about the same at 5.4 He has regained some of the weight that he had lost last year when he was sick  Unable to exercise much because of his continuing back problems but is trying to be more active Does follow his diet fairly well usually   Oral hypoglycemic drugs:   metformin ER 1500 mg pcs      Side effects from medications: None       Monitors blood glucose:  about once a day or less .    Glucometer:  One Touch        Blood Glucose readings from download:  Mean values apply above for all meters except median for One Touch  PRE-MEAL Fasting Lunch Dinner Bedtime Overall  Glucose range:       Mean/median:      102   POST-MEAL PC Breakfast PC Lunch PC Dinner  Glucose range:  86-131  95-119  90, 103  Mean/median:       Physical activity:  Doing a little walking only Dietician visit: Most recent: 11/2008    Weight control:  Wt Readings from Last 3 Encounters:  12/14/17 155 lb (70.3 kg)  07/28/17 159 lb 9.6 oz (72.4 kg)  06/15/17 149 lb 12.8 oz (67.9 kg)        Eye exam has been done annually   Diabetes labs:  Lab Results  Component Value Date   HGBA1C 5.4 12/14/2017   HGBA1C 5.3 06/15/2017   HGBA1C 5.7 12/15/2016   Lab Results  Component Value Date   MICROALBUR <0.7 12/15/2016   [place] 61 12/15/2016   CREATININE 1.19 07/28/2017      Allergies as of 12/14/2017      Reactions   Sulfamethoxazole-trimethoprim Other (See Comments)   "Extreme discomfort"   Sulfa Antibiotics Rash   Niaspan [niacin Er] Hives      Medication List        Accurate as of 12/14/17  8:14 AM. Always use your most recent med list.          alfuzosin 10 MG 24 hr tablet Commonly known as:  UROXATRAL Take 10 mg by mouth at bedtime.   amLODipine 5 MG tablet Commonly known as:  NORVASC TAKE 1 TABLET BY MOUTH  DAILY   aspirin EC 81 MG tablet Take 81 mg by mouth daily.   atorvastatin 20 MG tablet Commonly known as:  LIPITOR TAKE 1 TABLET BY MOUTH  DAILY   fexofenadine 180 MG tablet Commonly known as:  ALLEGRA Take 180 mg by mouth daily.   folic acid 941 MCG tablet Commonly known as:  FOLVITE Take 800 mcg by mouth daily.   gabapentin 300 MG capsule Commonly known as:  NEURONTIN Take 300 mg by mouth as directed.   levothyroxine 125 MCG tablet Commonly known as:  SYNTHROID, LEVOTHROID TAKE 1 TABLET BY MOUTH  DAILY  metFORMIN 500 MG 24 hr tablet Commonly known as:  GLUCOPHAGE-XR TAKE 3 TABLETS BY MOUTH AT  BEDTIME   multivitamin per tablet Take 1 tablet by mouth daily.   NEXIUM 24HR 20 MG capsule Generic drug:  esomeprazole Take 20 mg by mouth daily at 12 noon.   niacin 500 MG tablet Take 1 tablet (500 mg total) by mouth at bedtime.   ONETOUCH VERIO test strip Generic drug:  glucose blood CHECK BLOOD SUGAR ONCE A  DAY   ramipril 10 MG capsule Commonly known as:  ALTACE TAKE 1 CAPSULE BY MOUTH  DAILY   traMADol 50 MG tablet Commonly known as:  ULTRAM Take 50 mg by mouth every 6 (six) hours as needed for severe pain (leg pain).       Allergies:  Allergies  Allergen Reactions  . Sulfamethoxazole-Trimethoprim Other (See Comments)    "Extreme discomfort"  . Sulfa Antibiotics Rash  . Niaspan [Niacin Er] Hives    Past Medical History:  Diagnosis Date  . Allergy   . AMI (acute mesenteric ischemia) ([place])   . Arthritis, lumbar spine   . Cataract   .  Colon polyps   . Diabetes mellitus without complication ([place])   . Diverticulosis   . GERD (gastroesophageal reflux disease)   . Heart attack ([place]) 2004  . Hemorrhoids   . HTN (hypertension)   . Hypothyroidism   . Inguinal hernia   . Lumbar scoliosis   . Rectal bleeding     Past Surgical History:  Procedure Laterality Date  . APPENDECTOMY  1966  . by-pass    . CATARACT EXTRACTION     Bil  /   11/2012  . COLONOSCOPY    . CORONARY ANGIOPLASTY WITH STENT PLACEMENT    . CORONARY ARTERY BYPASS GRAFT  2004   x 5 / one stent  . FEMUR IM NAIL Left 05/25/2014   Procedure: INTRAMEDULLARY (IM) NAIL FEMORAL WITH CABLES;  Surgeon: Meredith Pel, MD;  Location: [facility];  Service: Orthopedics;  Laterality: Left;  . INGUINAL HERNIA REPAIR  1994   right side  . lumbar laminotomy  10/2009  . POLYPECTOMY      Family History  Problem Relation Age of Onset  . Heart disease Father   . Colon cancer Cousin     Social History:  reports that he quit smoking about 44 years ago. His smoking use included cigarettes. He has a 10.00 pack-year smoking history. He has never used smokeless tobacco. He reports that he does not drink alcohol or use drugs.  Review of Systems:  Lipids: LDL Usually at target on a regimen of Lipitor 20 mg daily.   Also has been taking niacin for low normal HDL    Lab Results  Component Value Date   CHOL 121 12/15/2016   HDL 40.80 12/15/2016   LDLCALC 61 12/15/2016   TRIG 96.0 12/15/2016   CHOLHDL 3 12/15/2016     HYPOTHYROIDISM: This is long-standing and has been on consistent regimen at home with Synthroid His dose was reduced to 125 g in 9/15 when TSH was low  TSH has been consistently normal since then  Overall he feels fairly good  Lab Results  Component Value Date   TSH 1.60 06/16/2016   TSH 1.15 12/12/2015   TSH 2.37 01/04/2015   FREET4 1.03 01/04/2015   FREET4 1.56 05/17/2014   FREET4 1.19 03/08/2014    Hypertension:    Foot exam done in  4/18, normal findings  Eye exam  7/18    Examination:   BP 112/70 (BP Location: Right Arm, Patient Position: Sitting, Cuff Size: Normal)   Pulse 65   Ht 5\' 7"  (1.702 m)   Wt 155 lb (70.3 kg)   SpO2 95%   BMI 24.28 kg/m   Body mass index is 24.28 kg/m.     ASSESSMENT/ PLAN:   Diabetes type 2 without obesity:   See history of present illness for discussion of current diabetes management, blood sugar patterns and problems identified  He is on metformin ER 1500 mg monotherapy for quite some time  His A1c is lower than expected 5.3-5.7 consistently  No hyperglycemia at home on his monitoring including after supper although has not done fasting readings recently Usually able to do well with his diet Encouraged him to check blood sugars at various times and call if unusually high No change in metformin  HYPERLIPIDEMIA: Treated with Lipitor and niacin, last LDL under 70  Needs follow-up -to be done  Hypothyroidism: Subjectively doing well Needs follow-up TSH    Elayne Snare 12/14/2017, 8:14 AM

## 2017-12-16 ENCOUNTER — Encounter: Payer: Self-pay | Admitting: Endocrinology

## 2017-12-19 ENCOUNTER — Encounter: Payer: Self-pay | Admitting: Endocrinology

## 2017-12-20 ENCOUNTER — Encounter: Payer: Self-pay | Admitting: Endocrinology

## 2017-12-21 ENCOUNTER — Other Ambulatory Visit: Payer: Self-pay | Admitting: *Deleted

## 2017-12-21 MED ORDER — LEVOTHYROXINE SODIUM 137 MCG PO TABS: 137.0000 ug | ORAL_TABLET | Freq: Every day | ORAL | 0 refills | Status: DC

## 2017-12-22 ENCOUNTER — Encounter: Payer: Self-pay | Admitting: Endocrinology

## 2017-12-22 ENCOUNTER — Encounter: Payer: Self-pay | Admitting: Cardiovascular Disease

## 2017-12-22 ENCOUNTER — Telehealth: Payer: Self-pay | Admitting: Endocrinology

## 2017-12-22 NOTE — Telephone Encounter (Signed)
error 

## 2017-12-28 ENCOUNTER — Encounter: Payer: Self-pay | Admitting: Endocrinology

## 2018-02-01 ENCOUNTER — Other Ambulatory Visit: Payer: Self-pay | Admitting: Cardiovascular Disease

## 2018-02-08 ENCOUNTER — Other Ambulatory Visit: Payer: Self-pay | Admitting: Endocrinology

## 2018-03-01 ENCOUNTER — Other Ambulatory Visit (INDEPENDENT_AMBULATORY_CARE_PROVIDER_SITE_OTHER): Payer: Medicare Other

## 2018-03-01 ENCOUNTER — Other Ambulatory Visit: Payer: Self-pay | Admitting: Endocrinology

## 2018-03-01 DIAGNOSIS — E1122 Type 2 diabetes mellitus with diabetic chronic kidney disease: Secondary | ICD-10-CM

## 2018-03-01 DIAGNOSIS — N183 Chronic kidney disease, stage 3 (moderate): Secondary | ICD-10-CM | POA: Diagnosis not present

## 2018-03-01 LAB — TSH: TSH: 1.54 u[IU]/mL (ref 0.35–4.50)

## 2018-03-01 LAB — BASIC METABOLIC PANEL
BUN: 15 mg/dL (ref 6–23)
CO2: 27 mEq/L (ref 19–32)
Calcium: 9.3 mg/dL (ref 8.4–10.5)
Chloride: 105 mEq/L (ref 96–112)
Creatinine, Ser: 1.28 mg/dL (ref 0.40–1.50)
GFR: 58.67 mL/min — ABNORMAL LOW (ref >60.00)
Glucose, Bld: 98 mg/dL (ref 70–99)
Potassium: 4.8 mEq/L (ref 3.5–5.1)
Sodium: 138 mEq/L (ref 135–145)

## 2018-03-01 LAB — T4, FREE: Free T4: 1.16 ng/dL (ref 0.60–1.60)

## 2018-03-03 ENCOUNTER — Encounter: Payer: Self-pay | Admitting: Cardiovascular Disease

## 2018-03-04 ENCOUNTER — Ambulatory Visit: Payer: Medicare Other | Admitting: Endocrinology

## 2018-03-04 ENCOUNTER — Encounter: Payer: Self-pay | Admitting: Endocrinology

## 2018-03-04 VITALS — BP 102/68 | HR 70 | Ht 67.0 in | Wt 157.0 lb

## 2018-03-04 DIAGNOSIS — E063 Autoimmune thyroiditis: Secondary | ICD-10-CM | POA: Diagnosis not present

## 2018-03-04 DIAGNOSIS — E119 Type 2 diabetes mellitus without complications: Secondary | ICD-10-CM

## 2018-03-04 NOTE — Progress Notes (Signed)
Patient ID: [name] [name], male   DOB: Apr 14, 1946, 72 y.o.   MRN: [id number]    Reason for Appointment: follow-up of thyroid  History of Present Illness   HYPOTHYROIDISM: This is long-standing and has been on levothyroxine supplementation  On his recent follow-up in 12/2017 although he was not complaining of any unusual fatigue his TSH had increased up to about 6 His levothyroxine was increased from 125 up to 137 mcg daily which he has taken regularly He takes this consistently in the morning before breakfast and has been usually very regular with this With the dosage change he does not feel any different, no recent weight change  Thyroid levels are back to normal now  Lab Results  Component Value Date   TSH 1.54 03/01/2018   TSH 6.80 (H) 12/14/2017   TSH 1.60 06/16/2016   FREET4 1.16 03/01/2018   FREET4 1.02 12/14/2017   FREET4 1.03 01/04/2015    PROBLEM 2:   Diagnosis: Type 2 DIABETES MELITUS, date of diagnosis: 2009     Previous history: He has been treated with metformin monotherapy for several years with stable control Unable to exercise much because of chronic back pain  Recent history:  The following is a copy of the previous note    He has consistently had consistent control with metformin alone, now taking 1500 mg  No GI side effects with metformin A1c has been better than expected for his vomiting is about the same at 5.4 He has regained some of the weight that he had lost last year when he was sick  Unable to exercise much because of his continuing back problems but is trying to be more active Does follow his diet fairly well usually   Oral hypoglycemic drugs:   metformin ER 1500 mg pcs      Side effects from medications: None       Monitors blood glucose:  about once a day or less .    Glucometer:  One Touch        Blood Glucose readings from download:  Mean values apply above for all meters except median for One Touch  PRE-MEAL Fasting  Lunch Dinner Bedtime Overall  Glucose range:       Mean/median:      102   POST-MEAL PC Breakfast PC Lunch PC Dinner  Glucose range:  86-131  95-119  90, 103  Mean/median:       Physical activity:  Doing a little walking only Dietician visit: Most recent: 11/2008    Weight control:  Wt Readings from Last 3 Encounters:  03/04/18 157 lb (71.2 kg)  12/14/17 155 lb (70.3 kg)  07/28/17 159 lb 9.6 oz (72.4 kg)        Eye exam has been done annually   Diabetes labs:  Lab Results  Component Value Date   HGBA1C 5.4 12/14/2017   HGBA1C 5.3 06/15/2017   HGBA1C 5.7 12/15/2016   Lab Results  Component Value Date   MICROALBUR <0.7 12/14/2017   LDLCALC 49 12/14/2017   CREATININE 1.28 03/01/2018      Allergies as of 03/04/2018      Reactions   Sulfamethoxazole-trimethoprim Other (See Comments)   "Extreme discomfort"   Sulfa Antibiotics Rash   Niaspan [niacin Er] Hives      Medication List        Accurate as of 03/04/18  8:46 AM. Always use your most recent med list.  amLODipine 5 MG tablet Commonly known as:  NORVASC TAKE 1 TABLET BY MOUTH  DAILY   aspirin EC 81 MG tablet Take 81 mg by mouth daily.   atorvastatin 20 MG tablet Commonly known as:  LIPITOR TAKE 1 TABLET BY MOUTH  DAILY   celecoxib 200 MG capsule Commonly known as:  CELEBREX Take 200 mg by mouth daily.   fexofenadine 180 MG tablet Commonly known as:  ALLEGRA Take 180 mg by mouth daily.   folic acid 938 MCG tablet Commonly known as:  FOLVITE Take 800 mcg by mouth daily.   gabapentin 300 MG capsule Commonly known as:  NEURONTIN Take 300 mg by mouth as directed.   levothyroxine 137 MCG tablet Commonly known as:  SYNTHROID, LEVOTHROID TAKE 1 TABLET BY MOUTH  DAILY BEFORE BREAKFAST   metFORMIN 500 MG 24 hr tablet Commonly known as:  GLUCOPHAGE-XR TAKE 3 TABLETS BY MOUTH AT  BEDTIME   multivitamin per tablet Take 1 tablet by mouth daily.   NEXIUM 24HR 20 MG capsule Generic  drug:  esomeprazole Take 20 mg by mouth daily at 12 noon.   niacin 500 MG tablet Take 1 tablet (500 mg total) by mouth at bedtime.   ONETOUCH VERIO test strip Generic drug:  glucose blood CHECK BLOOD SUGAR ONCE A  DAY   ramipril 10 MG capsule Commonly known as:  ALTACE TAKE 1 CAPSULE BY MOUTH  DAILY   traMADol 50 MG tablet Commonly known as:  ULTRAM Take 50 mg by mouth every 6 (six) hours as needed for severe pain (leg pain).       Allergies:  Allergies  Allergen Reactions  . Sulfamethoxazole-Trimethoprim Other (See Comments)    "Extreme discomfort"  . Sulfa Antibiotics Rash  . Niaspan [Niacin Er] Hives    Past Medical History:  Diagnosis Date  . Allergy   . AMI (acute mesenteric ischemia) ([place])   . Arthritis, lumbar spine   . Cataract   . Colon polyps   . Diabetes mellitus without complication ([place])   . Diverticulosis   . GERD (gastroesophageal reflux disease)   . Heart attack ([place]) 2004  . Hemorrhoids   . HTN (hypertension)   . Hypothyroidism   . Inguinal hernia   . Lumbar scoliosis   . Rectal bleeding     Past Surgical History:  Procedure Laterality Date  . APPENDECTOMY  1966  . by-pass    . CATARACT EXTRACTION     Bil  /   11/2012  . COLONOSCOPY    . CORONARY ANGIOPLASTY WITH STENT PLACEMENT    . CORONARY ARTERY BYPASS GRAFT  2004   x 5 / one stent  . FEMUR IM NAIL Left 05/25/2014   Procedure: INTRAMEDULLARY (IM) NAIL FEMORAL WITH CABLES;  Surgeon: Meredith Pel, MD;  Location: [facility];  Service: Orthopedics;  Laterality: Left;  . INGUINAL HERNIA REPAIR  1994   right side  . lumbar laminotomy  10/2009  . POLYPECTOMY      Family History  Problem Relation Age of Onset  . Diabetes Mother   . Heart disease Father   . Colon cancer Cousin     Social History:  reports that he quit smoking about 44 years ago. His smoking use included cigarettes. He has a 10.00 pack-year smoking history. He has never used smokeless tobacco. He reports that he  does not drink alcohol or use drugs.  Review of Systems:  Lipids: LDL [place] has been at target on a regimen of Lipitor  20 mg daily.   Also has been taking niacin for low normal HDL    Lab Results  Component Value Date   CHOL 107 12/14/2017   HDL 36.50 (L) 12/14/2017   LDLCALC 49 12/14/2017   TRIG 107.0 12/14/2017   CHOLHDL 3 12/14/2017     Foot exam done in 4/18, normal findings  Eye exam 7/18    Examination:   BP 102/68 (BP Location: Left Arm, Patient Position: Sitting, Cuff Size: Normal)   Pulse 70   Ht 5\' 7"  (1.702 m)   Wt 157 lb (71.2 kg)   SpO2 98%   BMI 24.59 kg/m   Body mass index is 24.59 kg/m.     ASSESSMENT/ PLAN:    Hypothyroidism, primary and long-standing:   Subjectively doing well with recent increase in dosage from 125 up to 137 mcg daily Not clear why his dosage has changed but his TSH is back to normal He is compliant with his supplement before breakfast He will continue the same dose   Diabetes type 2: He will follow-up as scheduled in 4 months   Parthena Fergeson 03/04/2018, 8:46 AM

## 2018-03-04 NOTE — Telephone Encounter (Signed)
Spoke with [name] [name] re: the patients My Chart message letting us know all of the pain meds [name] [name] has been given by his back surgeon. I advised her that he should continue his aspirin 81mg  but needs to be sure all of his MD's are aware he his on it. He has been struggling with sever back pain after having lumbar fusion one year ago. I encouraged her to continue to speak with the surgeon and to continue letting us know any med changes he is experiencing so we can make sure that his med list stays up to date. She agrees and was very appreciative of our conversation this morning.

## 2018-03-06 ENCOUNTER — Other Ambulatory Visit: Payer: Self-pay | Admitting: Cardiovascular Disease

## 2018-03-29 ENCOUNTER — Encounter: Payer: Self-pay | Admitting: Endocrinology

## 2018-03-29 LAB — HM DIABETES EYE EXAM

## 2018-06-15 ENCOUNTER — Ambulatory Visit: Payer: Medicare Other | Admitting: Endocrinology

## 2018-06-15 ENCOUNTER — Encounter: Payer: Self-pay | Admitting: Endocrinology

## 2018-06-15 VITALS — BP 124/82 | HR 70 | Ht 67.0 in | Wt 156.0 lb

## 2018-06-15 DIAGNOSIS — E063 Autoimmune thyroiditis: Secondary | ICD-10-CM

## 2018-06-15 DIAGNOSIS — E119 Type 2 diabetes mellitus without complications: Secondary | ICD-10-CM

## 2018-06-15 DIAGNOSIS — E1122 Type 2 diabetes mellitus with diabetic chronic kidney disease: Secondary | ICD-10-CM

## 2018-06-15 DIAGNOSIS — N183 Chronic kidney disease, stage 3 (moderate): Secondary | ICD-10-CM | POA: Diagnosis not present

## 2018-06-15 LAB — COMPREHENSIVE METABOLIC PANEL
ALT: 14 U/L (ref 0–53)
AST: 16 U/L (ref 0–37)
Albumin: 4.4 g/dL (ref 3.5–5.2)
Alkaline Phosphatase: 73 U/L (ref 39–117)
BUN: 24 mg/dL — ABNORMAL HIGH (ref 6–23)
CO2: 25 mEq/L (ref 19–32)
Calcium: 9.4 mg/dL (ref 8.4–10.5)
Chloride: 103 mEq/L (ref 96–112)
Creatinine, Ser: 1.37 mg/dL (ref 0.40–1.50)
GFR: 54.2 mL/min — ABNORMAL LOW (ref >60.00)
Glucose, Bld: 97 mg/dL (ref 70–99)
Potassium: 4.5 mEq/L (ref 3.5–5.1)
Sodium: 136 mEq/L (ref 135–145)
Total Bilirubin: 0.4 mg/dL (ref 0.2–1.2)
Total Protein: 7.5 g/dL (ref 6.0–8.3)

## 2018-06-15 LAB — TSH: TSH: 0.93 u[IU]/mL (ref 0.35–4.50)

## 2018-06-15 LAB — POCT GLYCOSYLATED HEMOGLOBIN (HGB A1C): Hemoglobin A1C: 5.6 % (ref 4.0–5.6)

## 2018-06-15 NOTE — Progress Notes (Signed)
Patient ID: [name] [name], male   DOB: May 14, 1946, 72 y.o.   MRN: [id number]    Reason for Appointment: follow-up of thyroid  History of Present Illness   HYPOTHYROIDISM: This is long-standing and has been on levothyroxine supplementation  On his follow-up in 12/2017 although he was not complaining of any unusual fatigue but his TSH had increased to 6.8  His levothyroxine was increased from 125 up to 137 mcg daily and his TSH was back to normal at 1.5 He takes his supplement consistently in the morning before breakfast Does not forget this lately He reports good energy level  Thyroid labs as follows: Today's labs pending  Lab Results  Component Value Date   TSH 0.93 06/15/2018   TSH 1.54 03/01/2018   TSH 6.80 (H) 12/14/2017   FREET4 1.16 03/01/2018   FREET4 1.02 12/14/2017   FREET4 1.03 01/04/2015    PROBLEM 2:   Diagnosis: Type 2 DIABETES MELITUS, date of diagnosis: 2009     Previous history: He has been treated with metformin monotherapy for several years with stable control Unable to exercise much because of chronic back pain  Recent history:   His A1c is still normal at 5.6, previously 5.4  He has consistently had consistent control with metformin alone, taking 1500 mg of the extended release He has checked a few blood sugars at home and these are usually within normal range He has been a little more active recently with having less back pain Weight is about the same   Oral hypoglycemic drugs:   metformin ER 1500 mg pcs      Side effects from medications: None       Monitors blood glucose:  about once a day or less .    Glucometer:  One Touch        Blood Glucose readings from download:  Recent blood sugar range 85-134 with median 96, mostly in the afternoons  Previous median 102   Physical activity:  Doing a little walking only Dietician visit: Most recent: 11/2008    Weight control:  Wt Readings from Last 3 Encounters:  06/15/18 156  lb (70.8 kg)  03/04/18 157 lb (71.2 kg)  12/14/17 155 lb (70.3 kg)        Eye exam has been done annually   Diabetes labs:  Lab Results  Component Value Date   HGBA1C 5.6 06/15/2018   HGBA1C 5.4 12/14/2017   HGBA1C 5.3 06/15/2017   Lab Results  Component Value Date   MICROALBUR <0.7 12/14/2017   LDLCALC 49 12/14/2017   CREATININE 1.37 06/15/2018      Allergies as of 06/15/2018      Reactions   Sulfamethoxazole-trimethoprim Other (See Comments)   "Extreme discomfort"   Sulfa Antibiotics Rash   Niaspan [niacin Er] Hives      Medication List        Accurate as of 06/15/18 11:56 AM. Always use your most recent med list.          amLODipine 5 MG tablet Commonly known as:  NORVASC TAKE 1 TABLET BY MOUTH  DAILY   aspirin EC 81 MG tablet Take 81 mg by mouth daily.   atorvastatin 20 MG tablet Commonly known as:  LIPITOR TAKE 1 TABLET BY MOUTH  DAILY   celecoxib 200 MG capsule Commonly known as:  CELEBREX Take 200 mg by mouth daily.   fexofenadine 180 MG tablet Commonly known as:  ALLEGRA Take 180 mg by mouth  daily.   folic acid 644 MCG tablet Commonly known as:  FOLVITE Take 800 mcg by mouth daily.   gabapentin 300 MG capsule Commonly known as:  NEURONTIN Take 300 mg by mouth as directed.   levothyroxine 137 MCG tablet Commonly known as:  SYNTHROID, LEVOTHROID TAKE 1 TABLET BY MOUTH  DAILY BEFORE BREAKFAST   metFORMIN 500 MG 24 hr tablet Commonly known as:  GLUCOPHAGE-XR TAKE 3 TABLETS BY MOUTH AT  BEDTIME   multivitamin per tablet Take 1 tablet by mouth daily.   NEXIUM 24HR 20 MG capsule Generic drug:  esomeprazole Take 20 mg by mouth daily at 12 noon.   niacin 500 MG tablet Take 1 tablet (500 mg total) by mouth at bedtime.   ONETOUCH VERIO test strip Generic drug:  glucose blood CHECK BLOOD SUGAR ONCE A  DAY   ramipril 10 MG capsule Commonly known as:  ALTACE TAKE 1 CAPSULE BY MOUTH  DAILY   traMADol 50 MG tablet Commonly known as:   ULTRAM Take 50 mg by mouth every 6 (six) hours as needed for severe pain (leg pain).       Allergies:  Allergies  Allergen Reactions  . Sulfamethoxazole-Trimethoprim Other (See Comments)    "Extreme discomfort"  . Sulfa Antibiotics Rash  . Niaspan [Niacin Er] Hives    Past Medical History:  Diagnosis Date  . Allergy   . AMI (acute mesenteric ischemia) ([place])   . Arthritis, lumbar spine   . Cataract   . Colon polyps   . Diabetes mellitus without complication ([place])   . Diverticulosis   . GERD (gastroesophageal reflux disease)   . Heart attack ([place]) 2004  . Hemorrhoids   . HTN (hypertension)   . Hypothyroidism   . Inguinal hernia   . Lumbar scoliosis   . Rectal bleeding     Past Surgical History:  Procedure Laterality Date  . APPENDECTOMY  1966  . by-pass    . CATARACT EXTRACTION     Bil  /   11/2012  . COLONOSCOPY    . CORONARY ANGIOPLASTY WITH STENT PLACEMENT    . CORONARY ARTERY BYPASS GRAFT  2004   x 5 / one stent  . FEMUR IM NAIL Left 05/25/2014   Procedure: INTRAMEDULLARY (IM) NAIL FEMORAL WITH CABLES;  Surgeon: Meredith Pel, MD;  Location: [facility];  Service: Orthopedics;  Laterality: Left;  . INGUINAL HERNIA REPAIR  1994   right side  . lumbar laminotomy  10/2009  . POLYPECTOMY      Family History  Problem Relation Age of Onset  . Diabetes Mother   . Heart disease Father   . Colon cancer Cousin     Social History:  reports that he quit smoking about 44 years ago. His smoking use included cigarettes. He has a 10.00 pack-year smoking history. He has never used smokeless tobacco. He reports that he does not drink alcohol or use drugs.  Review of Systems:  Lipids: LDL has been at target of under 70 on a regimen of Lipitor 20 mg daily.   Also has been taking niacin for low normal HDL    Lab Results  Component Value Date   CHOL 107 12/14/2017   HDL 36.50 (L) 12/14/2017   LDLCALC 49 12/14/2017   TRIG 107.0 12/14/2017   CHOLHDL 3 12/14/2017      Foot exam done in 10/19, normal sensory exam  Eye exam 7/19    Examination:   BP 124/82   Pulse 70  Ht 5\' 7"  (1.702 m)   Wt 156 lb (70.8 kg)   SpO2 97%   BMI 24.43 kg/m   Body mass index is 24.43 kg/m.   Diabetic Foot Exam - Simple   Simple Foot Form Diabetic Foot exam was performed with the following findings:  Yes 06/15/2018  8:22 AM  Visual Inspection No deformities, no ulcerations, no other skin breakdown bilaterally:  Yes Sensation Testing Intact to touch and monofilament testing bilaterally:  Yes See comments:  Yes Pulse Check Posterior Tibialis and Dorsalis pulse intact bilaterally:  Yes Comments Reduced left dorsalis pedis      ASSESSMENT/ PLAN:    Diabetes type 2, nonobese:  A1c is within normal at 5.6  He is on monotherapy with extended release metformin with good control as before Blood sugars are good at home Recently has been active also Usually watch his diet fairly well He will continue the same regimen long-term  Hypothyroidism, primary and long-standing:   Subjectively doing well and now taking 137 mcg levothyroxine We will check his TSH again   Elayne Snare 06/15/2018, 11:56 AM

## 2018-06-21 ENCOUNTER — Other Ambulatory Visit: Payer: Self-pay | Admitting: Cardiovascular Disease

## 2018-06-21 ENCOUNTER — Other Ambulatory Visit: Payer: Self-pay | Admitting: Endocrinology

## 2018-06-26 ENCOUNTER — Other Ambulatory Visit: Payer: Self-pay | Admitting: Endocrinology

## 2018-07-13 ENCOUNTER — Ambulatory Visit: Payer: Medicare Other | Admitting: Endocrinology

## 2018-07-28 ENCOUNTER — Other Ambulatory Visit: Payer: Self-pay | Admitting: Cardiovascular Disease

## 2018-09-03 ENCOUNTER — Encounter: Payer: Self-pay | Admitting: Cardiovascular Disease

## 2018-09-15 ENCOUNTER — Other Ambulatory Visit: Payer: Self-pay | Admitting: Cardiovascular Disease

## 2018-10-04 ENCOUNTER — Ambulatory Visit: Payer: Medicare Other | Admitting: Cardiovascular Disease

## 2018-10-04 ENCOUNTER — Encounter: Payer: Self-pay | Admitting: Cardiovascular Disease

## 2018-10-04 VITALS — BP 126/80 | HR 93 | Ht 67.0 in | Wt 160.1 lb

## 2018-10-04 DIAGNOSIS — E782 Mixed hyperlipidemia: Secondary | ICD-10-CM | POA: Diagnosis not present

## 2018-10-04 DIAGNOSIS — I25810 Atherosclerosis of coronary artery bypass graft(s) without angina pectoris: Secondary | ICD-10-CM

## 2018-10-04 DIAGNOSIS — I498 Other specified cardiac arrhythmias: Secondary | ICD-10-CM

## 2018-10-04 DIAGNOSIS — I10 Essential (primary) hypertension: Secondary | ICD-10-CM

## 2018-10-04 NOTE — Progress Notes (Signed)
Cardiology Office Note:    Date:  10/04/2018   ID:  [name] [name], [name] [name] 1946-08-23, MRN [id number]  PCP:  Aurea Graff.Marlou Sa, MD  Cardiologist:  Sherren Mocha, MD  Electrophysiologist:  None   Referring MD: Aurea Graff.Marlou Sa, MD   Chief Complaint  Patient presents with  . Coronary Artery Disease    History of Present Illness:    [name] [name] is a 73 y.o. male with a hx of coronary artery disease and remote CABG.  Other problems include hyperlipidemia, hypertension, and type 2 diabetes.  He is here alone today. He's walking a mile about 3 days/week at the [facility]. His back pain has improved from the past. He's still having some muscle spasms. Today, he denies symptoms of palpitations, chest pain, shortness of breath, orthopnea, PND, lower extremity edema, dizziness, or syncope.  States he "feels better than he has felt in many years."  Past Medical History:  Diagnosis Date  . Allergy   . AMI (acute mesenteric ischemia) ([place])   . Arthritis, lumbar spine   . Cataract   . Colon polyps   . Diabetes mellitus without complication ([place])   . Diverticulosis   . GERD (gastroesophageal reflux disease)   . Heart attack ([place]) 2004  . Hemorrhoids   . HTN (hypertension)   . Hypothyroidism   . Inguinal hernia   . Lumbar scoliosis   . Rectal bleeding     Past Surgical History:  Procedure Laterality Date  . APPENDECTOMY  1966  . by-pass    . CATARACT EXTRACTION     Bil  /   11/2012  . COLONOSCOPY    . CORONARY ANGIOPLASTY WITH STENT PLACEMENT    . CORONARY ARTERY BYPASS GRAFT  2004   x 5 / one stent  . FEMUR IM NAIL Left 05/25/2014   Procedure: INTRAMEDULLARY (IM) NAIL FEMORAL WITH CABLES;  Surgeon: Meredith Pel, MD;  Location: [facility];  Service: Orthopedics;  Laterality: Left;  . INGUINAL HERNIA REPAIR  1994   right side  . lumbar laminotomy  10/2009  . POLYPECTOMY      Current Medications: Current Meds  Medication Sig  . amLODipine (NORVASC) 5 MG tablet Take 1 tablet (5  mg total) by mouth daily. Please keep upcoming appt with Dr. Burt Knack for future refills. Thank you  . aspirin EC 81 MG tablet Take 81 mg by mouth daily.  Marland Kitchen atorvastatin (LIPITOR) 20 MG tablet Take 1 tablet (20 mg total) by mouth daily. Please make overdue appt with Dr. Burt Knack before anymore refills. 1st attempt  . celecoxib (CELEBREX) 200 MG capsule Take 200 mg by mouth daily.  Marland Kitchen esomeprazole (NEXIUM 24HR) 20 MG capsule Take 20 mg by mouth daily at 12 noon.  . fexofenadine (ALLEGRA) 180 MG tablet Take 180 mg by mouth daily.    . folic acid (FOLVITE) 026 MCG tablet Take 800 mcg by mouth daily.   Marland Kitchen gabapentin (NEURONTIN) 300 MG capsule Take 300 mg by mouth as directed.   Marland Kitchen levothyroxine (SYNTHROID, LEVOTHROID) 137 MCG tablet TAKE 1 TABLET BY MOUTH  DAILY BEFORE BREAKFAST  . metFORMIN (GLUCOPHAGE-XR) 500 MG 24 hr tablet TAKE 3 TABLETS BY MOUTH AT  BEDTIME  . multivitamin (THERAGRAN) per tablet Take 1 tablet by mouth daily.    . niacin 500 MG tablet Take 1 tablet (500 mg total) by mouth at bedtime.  Glory Rosebush VERIO test strip CHECK BLOOD SUGAR ONCE A  DAY  . ramipril (ALTACE) 10 MG capsule Take 1  capsule (10 mg total) by mouth daily. Please keep upcoming appt with Dr. Burt Knack for future refills. Thank you  . traMADol (ULTRAM) 50 MG tablet Take 50 mg by mouth every 6 (six) hours as needed for severe pain (leg pain).     Allergies:   Sulfamethoxazole-trimethoprim; Sulfa antibiotics; and Niaspan [niacin er]   Social History   Socioeconomic History  . Marital status: Married    Spouse name: Not on file  . Number of children: Not on file  . Years of education: Not on file  . Highest education level: Not on file  Occupational History  . Not on file  Social Needs  . Financial resource strain: Not on file  . Food insecurity:    Worry: Not on file    Inability: Not on file  . Transportation needs:    Medical: Not on file    Non-medical: Not on file  Tobacco Use  . Smoking status: Former  Smoker    Packs/day: 1.00    Years: 10.00    Pack years: 10.00    Types: Cigarettes    Last attempt to quit: 09/08/1973    Years since quitting: 45.1  . Smokeless tobacco: Never Used  Substance and Sexual Activity  . Alcohol use: No  . Drug use: No  . Sexual activity: Not on file  Lifestyle  . Physical activity:    Days per week: Not on file    Minutes per session: Not on file  . Stress: Not on file  Relationships  . Social connections:    Talks on phone: Not on file    Gets together: Not on file    Attends religious service: Not on file    Active member of club or organization: Not on file    Attends meetings of clubs or organizations: Not on file    Relationship status: Not on file  Other Topics Concern  . Not on file  Social History Narrative  . Not on file     Family History: The patient's family history includes Colon cancer in his cousin; Diabetes in his mother; Heart disease in his father.  ROS:   Please see the history of present illness.     All other systems reviewed and are negative.  EKGs/Labs/Other Studies Reviewed:    The following studies were reviewed today: Myoview 03/27/2016: Study Highlights    Nuclear stress EF: 56%.  There was no ST segment deviation noted during stress.  Defect 1: There is a small defect of mild severity present in the basal inferior and mid inferior location.  This is a low risk study.  The left ventricular ejection fraction is normal (55-65%).   Low risk stress nuclear study with inferior thinning but no ischemia; EF 56 with normal wall motion.    Event Monitor 08/12/2017: Study Highlights   Sinus rhythm with single PVC's present   EKG:  EKG is ordered today.  The ekg ordered today demonstrates normal sinus rhythm with atrial bigeminy, nonspecific ST abnormality, heart rate 93 bpm.  Recent Labs: 06/15/2018: ALT 14; BUN 24; Creatinine, Ser 1.37; Potassium 4.5; Sodium 136; TSH 0.93  Recent Lipid Panel    Component  Value Date/Time   CHOL 107 12/14/2017 0824   TRIG 107.0 12/14/2017 0824   HDL 36.50 (L) 12/14/2017 0824   CHOLHDL 3 12/14/2017 0824   VLDL 21.4 12/14/2017 0824   LDLCALC 49 12/14/2017 0824    Physical Exam:    VS:  BP 126/80  Pulse 93   Ht 5\' 7"  (1.702 m)   Wt 160 lb 1.9 oz (72.6 kg)   SpO2 96%   BMI 25.08 kg/m     Wt Readings from Last 3 Encounters:  10/04/18 160 lb 1.9 oz (72.6 kg)  06/15/18 156 lb (70.8 kg)  03/04/18 157 lb (71.2 kg)     GEN:  Well nourished, well developed in no acute distress HEENT: Normal NECK: No JVD; No carotid bruits LYMPHATICS: No lymphadenopathy CARDIAC: Regular with premature beats noted, no murmur RESPIRATORY:  Clear to auscultation without rales, wheezing or rhonchi  ABDOMEN: Soft, non-tender, non-distended MUSCULOSKELETAL:  No edema; No deformity  SKIN: Warm and dry NEUROLOGIC:  Alert and oriented x 3 PSYCHIATRIC:  Normal affect   ASSESSMENT:    1. Coronary artery disease involving coronary bypass graft of native heart without angina pectoris   2. Mixed hyperlipidemia   3. Essential hypertension   4. Atrial bigeminy    PLAN:    In order of problems listed above:  1. The patient appears to be doing very well.  His medical program is reviewed and will be continued.  He is on an ACE inhibitor in the setting of diabetes.  He is treated with aspirin for antiplatelet therapy and a statin drug. 2. Most recent lipids are reviewed from April 2019 and his LDL cholesterol is at goal of less than 70 mg/dL.  He continues on atorvastatin. 3. Blood pressure is well controlled on a combination of amlodipine and ramipril. 4. The patient is asymptomatic.  We will continue to follow.  Event monitor from last year reviewed with benign findings.   Medication Adjustments/Labs and Tests Ordered: Current medicines are reviewed at length with the patient today.  Concerns regarding medicines are outlined above.  Orders Placed This Encounter  Procedures    . EKG 12-Lead   No orders of the defined types were placed in this encounter.   Patient Instructions  Medication Instructions:  Your provider recommends that you continue on your current medications as directed. Please refer to the Current Medication list given to you today.    Labwork: None  Testing/Procedures: None  Follow-Up: Your provider wants you to follow-up in: 1 year with Dr. Burt Knack or his assistant. You will receive a reminder letter in the mail two months in advance. If you don't receive a letter, please call our office to schedule the follow-up appointment.        Signed, Sherren Mocha, MD  10/04/2018 5:14 PM    [facility]

## 2018-10-04 NOTE — Patient Instructions (Signed)
Medication Instructions:  Your provider recommends that you continue on your current medications as directed. Please refer to the Current Medication list given to you today.    Labwork: None  Testing/Procedures: None  Follow-Up: Your provider wants you to follow-up in: 1 year with Dr. Cooper or his assistant. You will receive a reminder letter in the mail two months in advance. If you don't receive a letter, please call our office to schedule the follow-up appointment.    

## 2018-10-13 ENCOUNTER — Encounter

## 2018-10-30 ENCOUNTER — Other Ambulatory Visit: Payer: Self-pay | Admitting: Cardiovascular Disease

## 2018-12-13 MED ORDER — ATORVASTATIN CALCIUM 20 MG PO TABS: 20.0000 mg | ORAL_TABLET | Freq: Every day | ORAL | 3 refills | Status: DC

## 2018-12-15 ENCOUNTER — Ambulatory Visit (INDEPENDENT_AMBULATORY_CARE_PROVIDER_SITE_OTHER): Payer: Medicare Other

## 2018-12-15 DIAGNOSIS — E119 Type 2 diabetes mellitus without complications: Secondary | ICD-10-CM | POA: Diagnosis not present

## 2018-12-15 LAB — POCT GLYCOSYLATED HEMOGLOBIN (HGB A1C): Hemoglobin A1C: 5.7 % — AB (ref 4.0–5.6)

## 2018-12-15 NOTE — Progress Notes (Signed)
Patient ID: [name] [name], male   DOB: 26-Apr-1946, 73 y.o.   MRN: [id number]   Today's office visit was provided via telemedicine using video technique . Consent for the patient has been obtained . Location of the patient: Home . Location of the provider: Office Only the patient and myself were participating in the encounter  prior to the visit relevant section of the chart, labs and point-of-care recommendation report were reviewed in detail  Reason for Appointment: follow-up of diabetes  History of Present Illness   HYPOTHYROIDISM: This is long-standing and has been on levothyroxine supplementation  On his follow-up in 12/2017 although he was not complaining of any unusual fatigue but his TSH had increased to 6.8  His levothyroxine was increased from 125 up to 137 mcg daily and his TSH was was again normal in 10/19 Recent thyroid levels have not been done   Lab Results  Component Value Date   TSH 0.93 06/15/2018   TSH 1.54 03/01/2018   TSH 6.80 (H) 12/14/2017   FREET4 1.16 03/01/2018   FREET4 1.02 12/14/2017   FREET4 1.03 01/04/2015    PROBLEM 2:   Diagnosis: Type 2 DIABETES MELITUS, date of diagnosis: 2009     Previous history: He has been treated with metformin monotherapy for several years with stable control Unable to exercise much because of chronic back pain  Recent history:   His A1c is still normal at 5.7  He has consistently had consistent control with metformin alone, taking 1500 mg of the extended release He has checked a few blood sugars at home and these are usually within normal range He has been a little more active recently with having less back pain Weight is about the same   Oral hypoglycemic drugs:   metformin ER 1500 mg pcs      Side effects from medications: None       Monitors blood glucose:  about once a day or less .    Glucometer:  One Touch        Blood Glucose readings from download:  Morning blood sugars range from  91-102 After breakfast 86-128 After lunch 100-156 and after dinner 105-130 OVERALL median 105  Previous median 96  Physical activity:  Mostly doing some yard work lately, previously was going to the gym and walking 1 mile Dietician visit: Most recent: 11/2008    Weight control:  Wt Readings from Last 3 Encounters:  10/04/18 160 lb 1.9 oz (72.6 kg)  06/15/18 156 lb (70.8 kg)  03/04/18 157 lb (71.2 kg)        Eye exam has been done annually   Diabetes labs:  Lab Results  Component Value Date   HGBA1C 5.7 (A) 12/15/2018   HGBA1C 5.6 06/15/2018   HGBA1C 5.4 12/14/2017   Lab Results  Component Value Date   MICROALBUR <0.7 12/14/2017   LDLCALC 49 12/14/2017   CREATININE 1.37 06/15/2018      Allergies as of 12/16/2018      Reactions   Sulfamethoxazole-trimethoprim Other (See Comments)   "Extreme discomfort"   Sulfa Antibiotics Rash   Niaspan [niacin Er] Hives      Medication List       Accurate as of December 15, 2018 10:05 PM. Always use your most recent med list.        amLODipine 5 MG tablet Commonly known as:  NORVASC Take 1 tablet (5 mg total) by mouth daily.   aspirin EC 81 MG  tablet Take 81 mg by mouth daily.   atorvastatin 20 MG tablet Commonly known as:  LIPITOR Take 1 tablet (20 mg total) by mouth daily. Please make overdue appt with Dr. Burt Knack before anymore refills. 1st attempt   celecoxib 200 MG capsule Commonly known as:  CELEBREX Take 200 mg by mouth daily.   fexofenadine 180 MG tablet Commonly known as:  ALLEGRA Take 180 mg by mouth daily.   folic acid 491 MCG tablet Commonly known as:  FOLVITE Take 800 mcg by mouth daily.   gabapentin 300 MG capsule Commonly known as:  NEURONTIN Take 300 mg by mouth as directed.   levothyroxine 137 MCG tablet Commonly known as:  SYNTHROID, LEVOTHROID TAKE 1 TABLET BY MOUTH  DAILY BEFORE BREAKFAST   metFORMIN 500 MG 24 hr tablet Commonly known as:  GLUCOPHAGE-XR TAKE 3 TABLETS BY MOUTH AT  BEDTIME    multivitamin per tablet Take 1 tablet by mouth daily.   NexIUM 24HR 20 MG capsule Generic drug:  esomeprazole Take 20 mg by mouth daily at 12 noon.   niacin 500 MG tablet Take 1 tablet (500 mg total) by mouth at bedtime.   OneTouch Verio test strip Generic drug:  glucose blood CHECK BLOOD SUGAR ONCE A  DAY   ramipril 10 MG capsule Commonly known as:  ALTACE Take 1 capsule (10 mg total) by mouth daily.   traMADol 50 MG tablet Commonly known as:  ULTRAM Take 50 mg by mouth every 6 (six) hours as needed for severe pain (leg pain).       Allergies:  Allergies  Allergen Reactions  . Sulfamethoxazole-Trimethoprim Other (See Comments)    "Extreme discomfort"  . Sulfa Antibiotics Rash  . Niaspan [Niacin Er] Hives    Past Medical History:  Diagnosis Date  . Allergy   . AMI (acute mesenteric ischemia) ([place])   . Arthritis, lumbar spine   . Cataract   . Colon polyps   . Diabetes mellitus without complication ([place])   . Diverticulosis   . GERD (gastroesophageal reflux disease)   . Heart attack ([place]) 2004  . Hemorrhoids   . HTN (hypertension)   . Hypothyroidism   . Inguinal hernia   . Lumbar scoliosis   . Rectal bleeding     Past Surgical History:  Procedure Laterality Date  . APPENDECTOMY  1966  . by-pass    . CATARACT EXTRACTION     Bil  /   11/2012  . COLONOSCOPY    . CORONARY ANGIOPLASTY WITH STENT PLACEMENT    . CORONARY ARTERY BYPASS GRAFT  2004   x 5 / one stent  . FEMUR IM NAIL Left 05/25/2014   Procedure: INTRAMEDULLARY (IM) NAIL FEMORAL WITH CABLES;  Surgeon: Meredith Pel, MD;  Location: [facility];  Service: Orthopedics;  Laterality: Left;  . INGUINAL HERNIA REPAIR  1994   right side  . lumbar laminotomy  10/2009  . POLYPECTOMY      Family History  Problem Relation Age of Onset  . Diabetes Mother   . Heart disease Father   . Colon cancer Cousin     Social History:  reports that he quit smoking about 45 years ago. His smoking use included  cigarettes. He has a 10.00 pack-year smoking history. He has never used smokeless tobacco. He reports that he does not drink alcohol or use drugs.  Review of Systems:  Lipids: LDL has been at target of under 70 on a regimen of Lipitor 20 mg daily.  Also has been taking niacin for low normal HDL  Has not had any recent labs  Lab Results  Component Value Date   CHOL 107 12/14/2017   HDL 36.50 (L) 12/14/2017   LDLCALC 49 12/14/2017   TRIG 107.0 12/14/2017   CHOLHDL 3 12/14/2017     Foot exam done in 10/19, normal sensory exam  Eye exam 7/19    Examination:   There were no vitals taken for this visit.  There is no height or weight on file to calculate BMI.     ASSESSMENT/ PLAN:    Diabetes type 2, nonobese:  A1c is normal at 5.7  He is on long-term monotherapy with extended release metformin 1500 mg daily He has not had any progression of his diabetes  Overall and has been generally more active in the last few months although not much since he cannot go to the gym and does need to start doing walking for aerobic exercise outside  Blood sugars are excellent at home with some readings at all times and highest reading 156  We will continue to follow-up every 6 months  Hypothyroidism, primary and long-standing:   Subjectively doing well and now taking 137 mcg levothyroxine We will check his TSH when he has labs again and will message him with the results  Lipids: He is also overdue for lipid panel and this should be done with his next lab work which he can do with his PCP or over here Patient message also sent regarding needing urine microalbumin for the next visit  Elayne Snare 12/15/2018, 10:05 PM

## 2018-12-15 NOTE — Progress Notes (Signed)
Pt presents today for nurse visit for completion of A1C. Telehealth visit scheduled 12/16/18 with Dr. Dwyane Dee. A1C ordered and drawn per Dr. Ronnie Derby orders and per protocol. Results sent to Dr. Leonia Corona to review and address. Pt aware these results will be addressed during TeleHealth visit as scheduled. Meter also downloaded and data printed for Dr. Ronnie Derby review.

## 2018-12-16 ENCOUNTER — Encounter: Payer: Self-pay | Admitting: Endocrinology

## 2018-12-16 ENCOUNTER — Ambulatory Visit (INDEPENDENT_AMBULATORY_CARE_PROVIDER_SITE_OTHER): Payer: Medicare Other | Admitting: Endocrinology

## 2018-12-16 ENCOUNTER — Other Ambulatory Visit: Payer: Self-pay

## 2018-12-16 DIAGNOSIS — E063 Autoimmune thyroiditis: Secondary | ICD-10-CM

## 2018-12-16 DIAGNOSIS — E119 Type 2 diabetes mellitus without complications: Secondary | ICD-10-CM | POA: Diagnosis not present

## 2018-12-16 DIAGNOSIS — E782 Mixed hyperlipidemia: Secondary | ICD-10-CM | POA: Diagnosis not present

## 2018-12-20 ENCOUNTER — Other Ambulatory Visit: Payer: Self-pay | Admitting: Endocrinology

## 2019-01-12 ENCOUNTER — Telehealth: Payer: Self-pay | Admitting: Endocrinology

## 2019-01-12 NOTE — Telephone Encounter (Signed)
error 

## 2019-01-13 ENCOUNTER — Other Ambulatory Visit: Payer: Self-pay

## 2019-01-13 ENCOUNTER — Other Ambulatory Visit (INDEPENDENT_AMBULATORY_CARE_PROVIDER_SITE_OTHER): Payer: Medicare Other

## 2019-01-13 DIAGNOSIS — E119 Type 2 diabetes mellitus without complications: Secondary | ICD-10-CM | POA: Diagnosis not present

## 2019-01-13 DIAGNOSIS — E782 Mixed hyperlipidemia: Secondary | ICD-10-CM | POA: Diagnosis not present

## 2019-01-13 DIAGNOSIS — E063 Autoimmune thyroiditis: Secondary | ICD-10-CM

## 2019-01-13 LAB — MICROALBUMIN / CREATININE URINE RATIO
Creatinine,U: 122 mg/dL
Microalb Creat Ratio: 0.6 mg/g (ref 0.0–30.0)
Microalb, Ur: <0.7 mg/dL (ref 0.0–1.9)

## 2019-01-13 LAB — COMPREHENSIVE METABOLIC PANEL
ALT: 17 U/L (ref 0–53)
AST: 19 U/L (ref 0–37)
Albumin: 4.3 g/dL (ref 3.5–5.2)
Alkaline Phosphatase: 74 U/L (ref 39–117)
BUN: 18 mg/dL (ref 6–23)
CO2: 25 mEq/L (ref 19–32)
Calcium: 9 mg/dL (ref 8.4–10.5)
Chloride: 103 mEq/L (ref 96–112)
Creatinine, Ser: 1.46 mg/dL (ref 0.40–1.50)
GFR: 47.31 mL/min — ABNORMAL LOW (ref >60.00)
Glucose, Bld: 83 mg/dL (ref 70–99)
Potassium: 5.1 mEq/L (ref 3.5–5.1)
Sodium: 136 mEq/L (ref 135–145)
Total Bilirubin: 0.3 mg/dL (ref 0.2–1.2)
Total Protein: 7.1 g/dL (ref 6.0–8.3)

## 2019-01-13 LAB — LIPID PANEL
Cholesterol: 113 mg/dL (ref 0–200)
HDL: 42.4 mg/dL (ref >39.00)
LDL Cholesterol: 39 mg/dL (ref 0–99)
NonHDL: 70.94
Total CHOL/HDL Ratio: 3
Triglycerides: 160 mg/dL — ABNORMAL HIGH (ref 0.0–149.0)
VLDL: 32 mg/dL (ref 0.0–40.0)

## 2019-01-13 LAB — TSH: TSH: 1.3 u[IU]/mL (ref 0.35–4.50)

## 2019-02-24 ENCOUNTER — Other Ambulatory Visit: Payer: Self-pay | Admitting: Endocrinology

## 2019-04-05 LAB — HM DIABETES EYE EXAM

## 2019-06-11 ENCOUNTER — Other Ambulatory Visit: Payer: Self-pay | Admitting: Endocrinology

## 2019-06-19 ENCOUNTER — Other Ambulatory Visit: Payer: Self-pay | Admitting: Endocrinology

## 2019-06-19 NOTE — Progress Notes (Signed)
Patient ID: [name] [name], male   DOB: Nov 09, 1945, 73 y.o.   MRN: [id number]    Reason for Appointment: Endocrinology follow-up  History of Present Illness   HYPOTHYROIDISM: This is long-standing and has been on levothyroxine supplementation  Previously in 12/2017 although he was not complaining of any unusual fatigue but his TSH had increased to 6.8  His levothyroxine was increased from 125 up to 137 mcg daily and his TSH was was again normal as also in 5/20  He has lost weight but he thinks this is just from eating less, does not complain of any shakiness or palpitations No unusual fatigue Labs pending  Lab Results  Component Value Date   TSH 1.30 01/13/2019   TSH 0.93 06/15/2018   TSH 1.54 03/01/2018   FREET4 1.16 03/01/2018   FREET4 1.02 12/14/2017   FREET4 1.03 01/04/2015    PROBLEM 2:   Diagnosis: Type 2 DIABETES MELITUS, date of diagnosis: 2009     Previous history: He has been treated with metformin monotherapy for several years with stable control Unable to exercise much because of chronic back pain  Recent history:   His A1c is still normal at 5.5 although slightly lower  He has consistently had consistent control with metformin alone, taking 1500 mg of the extended release  He is not able to do much physical activity because of back pain Eating small portions He appears to have lost 8 pounds this year, apparently this trend is not continuing recently No nausea with Metformin, he says he is eating smaller portions however   Oral hypoglycemic drugs:   metformin ER 1500 mg pcs      Side effects from medications: None       Monitors blood glucose:  about once a day or less .    Glucometer:  One Touch        Blood Glucose readings from download:  Range 84-174. He is checking at various times, only 1 reading was relatively high at about 2 PM Overall AVERAGE 116  Physical activity:  Mostly doing some yard work not able to do much sustained  activity or walking   Dietician visit: Most recent: 11/2008    Weight control:  Wt Readings from Last 3 Encounters:  06/20/19 152 lb 6.4 oz (69.1 kg)  10/04/18 160 lb 1.9 oz (72.6 kg)  06/15/18 156 lb (70.8 kg)        Eye exam has been done annually   Diabetes labs:  Lab Results  Component Value Date   HGBA1C 5.7 (A) 12/15/2018   HGBA1C 5.6 06/15/2018   HGBA1C 5.4 12/14/2017   Lab Results  Component Value Date   MICROALBUR <0.7 01/13/2019   LDLCALC 39 01/13/2019   CREATININE 1.46 01/13/2019      Allergies as of 06/20/2019      Reactions   Sulfamethoxazole-trimethoprim Other (See Comments)   "Extreme discomfort"   Sulfa Antibiotics Rash   Niaspan [niacin Er] Hives      Medication List       Accurate as of June 20, 2019  8:13 AM. If you have any questions, ask your nurse or doctor.        amLODipine 5 MG tablet Commonly known as: NORVASC Take 1 tablet (5 mg total) by mouth daily.   aspirin EC 81 MG tablet Take 81 mg by mouth daily.   atorvastatin 20 MG tablet Commonly known as: LIPITOR Take 1 tablet (20 mg total) by mouth  daily. Please make overdue appt with Dr. Burt Knack before anymore refills. 1st attempt   celecoxib 200 MG capsule Commonly known as: CELEBREX Take 200 mg by mouth daily.   fexofenadine 180 MG tablet Commonly known as: ALLEGRA Take 180 mg by mouth daily.   folic acid A999333 MCG tablet Commonly known as: FOLVITE Take 800 mcg by mouth daily.   gabapentin 300 MG capsule Commonly known as: NEURONTIN Take 300 mg by mouth as directed.   levothyroxine 137 MCG tablet Commonly known as: SYNTHROID TAKE 1 TABLET BY MOUTH  DAILY BEFORE BREAKFAST   metFORMIN 500 MG 24 hr tablet Commonly known as: GLUCOPHAGE-XR TAKE 3 TABLETS BY MOUTH AT  BEDTIME   multivitamin per tablet Take 1 tablet by mouth daily.   NexIUM 24HR 20 MG capsule Generic drug: esomeprazole Take 20 mg by mouth daily at 12 noon.   niacin 500 MG tablet Take 1 tablet  (500 mg total) by mouth at bedtime.   OneTouch Verio test strip Generic drug: glucose blood USE AS DIRECTED TO CHECK  BLOOD SUGAR ONCE DAILY   ramipril 10 MG capsule Commonly known as: ALTACE Take 1 capsule (10 mg total) by mouth daily.   traMADol 50 MG tablet Commonly known as: ULTRAM Take 50 mg by mouth every 6 (six) hours as needed for severe pain (leg pain).       Allergies:  Allergies  Allergen Reactions  . Sulfamethoxazole-Trimethoprim Other (See Comments)    "Extreme discomfort"  . Sulfa Antibiotics Rash  . Niaspan [Niacin Er] Hives    Past Medical History:  Diagnosis Date  . Allergy   . AMI (acute mesenteric ischemia) ([place])   . Arthritis, lumbar spine   . Cataract   . Colon polyps   . Diabetes mellitus without complication ([place])   . Diverticulosis   . GERD (gastroesophageal reflux disease)   . Heart attack ([place]) 2004  . Hemorrhoids   . HTN (hypertension)   . Hypothyroidism   . Inguinal hernia   . Lumbar scoliosis   . Rectal bleeding     Past Surgical History:  Procedure Laterality Date  . APPENDECTOMY  1966  . by-pass    . CATARACT EXTRACTION     Bil  /   11/2012  . COLONOSCOPY    . CORONARY ANGIOPLASTY WITH STENT PLACEMENT    . CORONARY ARTERY BYPASS GRAFT  2004   x 5 / one stent  . FEMUR IM NAIL Left 05/25/2014   Procedure: INTRAMEDULLARY (IM) NAIL FEMORAL WITH CABLES;  Surgeon: Meredith Pel, MD;  Location: [facility];  Service: Orthopedics;  Laterality: Left;  . INGUINAL HERNIA REPAIR  1994   right side  . lumbar laminotomy  10/2009  . POLYPECTOMY      Family History  Problem Relation Age of Onset  . Diabetes Mother   . Heart disease Father   . Colon cancer Cousin     Social History:  reports that he quit smoking about 45 years ago. His smoking use included cigarettes. He has a 10.00 pack-year smoking history. He has never used smokeless tobacco. He reports that he does not drink alcohol or use drugs.  Review of Systems:  Lipids:  LDL has been at target of under 70 on a regimen of Lipitor 20 mg daily prescribed by his cardiologist.   Also has been taking niacin for low normal HDL    Lab Results  Component Value Date   CHOL 113 01/13/2019   HDL 42.40 01/13/2019  LDLCALC 39 01/13/2019   TRIG 160.0 (H) 01/13/2019   CHOLHDL 3 01/13/2019    Lab Results  Component Value Date   CREATININE 1.46 01/13/2019   CREATININE 1.37 06/15/2018   CREATININE 1.28 03/01/2018     Foot exam done in 10/19, normal sensory exam  Eye exam 7/19    Examination:   BP (!) 142/80 (BP Location: Left Arm, Patient Position: Sitting, Cuff Size: Normal)   Pulse (!) 104   Ht 5\' 7"  (1.702 m)   Wt 152 lb 6.4 oz (69.1 kg)   SpO2 98%   BMI 23.87 kg/m   Body mass index is 23.87 kg/m.     ASSESSMENT/ PLAN:    Diabetes type 2, nonobese:  A1c is 5.5  He is on  monotherapy with extended release metformin 1500 mg daily He has not had need to adjust his medications for quite some time  A1c is normal Likely has slightly lower A1c than before with his recent weight loss  He has some limitations with his activity level but with small portions his blood sugars are   Blood sugars are excellent at home and near normal, highest reading 174 after lunch only once  Recommendations: Since his weight is down and his appetite is decreased with his blood sugars being low and overall excellent will reduce his Metformin down to 2 tablets daily and have him take 1000 mg daily He also may have some renal dysfunction ongoing  Hypothyroidism, primary and long-standing:   He is taking 137 mcg levothyroxine He has no fatigue Has lost weight for other reasons No palpitations or tachycardia, he thinks his pulse is fast because of having to wear a mask today  We will check his TSH today and will message him with the results  Lipids: He is also overdue for lipid panel and this will be done  Renal dysfunction: His creatinine has been gradually  increasing.  He will follow up with his cardiologist and may consider reducing ramipril if creatinine is going up and adjust his medications otherwise  WEIGHT loss: He will need to discuss this with his PCP, not clear why his appetite is decreased.  We will also check CBC today  He is up-to-date with his influenza vaccine  Follow-up in 4 months  Sebastain Fishbaugh 06/20/2019, 8:13 AM      Total visit time for evaluation and management of multiple problems and counseling =25 minutes

## 2019-06-20 ENCOUNTER — Encounter: Payer: Self-pay | Admitting: Endocrinology

## 2019-06-20 ENCOUNTER — Ambulatory Visit (INDEPENDENT_AMBULATORY_CARE_PROVIDER_SITE_OTHER): Payer: Medicare Other | Admitting: Endocrinology

## 2019-06-20 ENCOUNTER — Other Ambulatory Visit: Payer: Self-pay

## 2019-06-20 VITALS — BP 142/80 | HR 104 | Ht 67.0 in | Wt 152.4 lb

## 2019-06-20 DIAGNOSIS — E063 Autoimmune thyroiditis: Secondary | ICD-10-CM

## 2019-06-20 DIAGNOSIS — E119 Type 2 diabetes mellitus without complications: Secondary | ICD-10-CM

## 2019-06-20 DIAGNOSIS — E782 Mixed hyperlipidemia: Secondary | ICD-10-CM

## 2019-06-20 DIAGNOSIS — N182 Chronic kidney disease, stage 2 (mild): Secondary | ICD-10-CM

## 2019-06-20 LAB — GLUCOSE, POCT (MANUAL RESULT ENTRY): POC Glucose: 88 mg/dl (ref 70–99)

## 2019-06-20 LAB — POCT GLYCOSYLATED HEMOGLOBIN (HGB A1C): Hemoglobin A1C: 5.5 % (ref 4.0–5.6)

## 2019-06-20 LAB — COMPREHENSIVE METABOLIC PANEL
ALT: 14 U/L (ref 0–53)
AST: 18 U/L (ref 0–37)
Albumin: 4.5 g/dL (ref 3.5–5.2)
Alkaline Phosphatase: 70 U/L (ref 39–117)
BUN: 22 mg/dL (ref 6–23)
CO2: 26 mEq/L (ref 19–32)
Calcium: 9.6 mg/dL (ref 8.4–10.5)
Chloride: 102 mEq/L (ref 96–112)
Creatinine, Ser: 1.47 mg/dL (ref 0.40–1.50)
GFR: 46.88 mL/min — ABNORMAL LOW (ref >60.00)
Glucose, Bld: 89 mg/dL (ref 70–99)
Potassium: 5 mEq/L (ref 3.5–5.1)
Sodium: 136 mEq/L (ref 135–145)
Total Bilirubin: 0.3 mg/dL (ref 0.2–1.2)
Total Protein: 7.4 g/dL (ref 6.0–8.3)

## 2019-06-20 LAB — CBC WITH DIFFERENTIAL/PLATELET
Basophils Absolute: 0.1 10*3/uL (ref 0.0–0.1)
Basophils Relative: 0.7 % (ref 0.0–3.0)
Eosinophils Absolute: 0.4 10*3/uL (ref 0.0–0.7)
Eosinophils Relative: 5.8 % — ABNORMAL HIGH (ref 0.0–5.0)
HCT: 34.3 % — ABNORMAL LOW (ref 39.0–52.0)
Hemoglobin: 10.7 g/dL — ABNORMAL LOW (ref 13.0–17.0)
Lymphocytes Relative: 32 % (ref 12.0–46.0)
Lymphs Abs: 2.4 10*3/uL (ref 0.7–4.0)
MCHC: 31.4 g/dL (ref 30.0–36.0)
MCV: 78.7 fl (ref 78.0–100.0)
Monocytes Absolute: 0.7 10*3/uL (ref 0.1–1.0)
Monocytes Relative: 9.7 % (ref 3.0–12.0)
Neutro Abs: 3.9 10*3/uL (ref 1.4–7.7)
Neutrophils Relative %: 51.8 % (ref 43.0–77.0)
Platelets: 249 10*3/uL (ref 150.0–400.0)
RBC: 4.35 Mil/uL (ref 4.22–5.81)
RDW: 20.2 % — ABNORMAL HIGH (ref 11.5–15.5)
WBC: 7.6 10*3/uL (ref 4.0–10.5)

## 2019-06-20 LAB — LIPID PANEL
Cholesterol: 110 mg/dL (ref 0–200)
HDL: 46.7 mg/dL (ref >39.00)
LDL Cholesterol: 41 mg/dL (ref 0–99)
NonHDL: 63.49
Total CHOL/HDL Ratio: 2
Triglycerides: 111 mg/dL (ref 0.0–149.0)
VLDL: 22.2 mg/dL (ref 0.0–40.0)

## 2019-06-20 LAB — TSH: TSH: 1.45 u[IU]/mL (ref 0.35–4.50)

## 2019-06-20 LAB — T4, FREE: Free T4: 1.34 ng/dL (ref 0.60–1.60)

## 2019-06-20 NOTE — Patient Instructions (Signed)
Take 2 Metformin  Check blood sugars on waking up 2 days a week  Also check blood sugars about 2 hours after meals and do this after different meals by rotation  Recommended blood sugar levels on waking up are 90-130 and about 2 hours after meal is 130-160  Please bring your blood sugar monitor to each visit, thank you

## 2019-06-21 NOTE — Progress Notes (Signed)
Please call to let patient know that the lab results are normal except for anemia and he needs to talk to his PCP about this

## 2019-07-04 NOTE — Progress Notes (Signed)
Cardiology Office Note:    Date:  07/05/2019   ID:  [name], [name] March 15, 1946, MRN [id number]  PCP:  [name] Graff.Marlou Sa, MD  Cardiologist:  [name] Mocha, MD   Electrophysiologist:  None   Referring MD: [name] Graff.Marlou Sa, MD   Chief Complaint  Patient presents with  . Follow-up    CAD    History of Present Illness:    [name] [name] is a 73 y.o. male with:   Coronary artery disease   S/p CABG  Atrial bigeminy   Hyperlipidemia   Hypertension   Diabetes mellitus 2  GERD  Hypothyroidism  [name] [name] was last seen by Dr. Burt [name] in 09/2018.  He returns for evaluation of chest pain.  He was recently seen by his endocrinologist.  His lab work demonstrated evidence of anemia with low hemoglobin (10.7).  He sees his primary care doctor later today for further evaluation.  His chest discomfort has been occurring over the past 2 months.  He is typically able to exert himself without chest discomfort.  However, he has left-sided discomfort that occurs after he completes activity.  This sometimes comes on 2 hours later.  He may sometimes go 2 weeks without symptoms.  His symptoms last about 5 to 10 minutes.  He does not have nitroglycerin.  He does not have associated shortness of breath, nausea or diaphoresis.  He does note some shortness of breath with exertion.  Of note, his chest discomfort reminds him somewhat of his previous angina, however it is not as bad.  He has not had syncope, orthopnea, lower extremity swelling.    Prior CV studies:   The following studies were reviewed today:  Event monitor 08/12/17 Sinus rhythm with single PVC's present  Myoview 03/27/16 Low risk stress nuclear study with inferior thinning but no ischemia; EF 56 with normal wall motion.  VAS Screen 02/2016    Past Medical History:  Diagnosis Date  . Allergy   . AMI (acute mesenteric ischemia) ([place])   . Arthritis, lumbar spine   . Cataract   . Colon polyps   . Diabetes mellitus without  complication ([place])   . Diverticulosis   . GERD (gastroesophageal reflux disease)   . Heart attack ([place]) 2004  . Hemorrhoids   . HTN (hypertension)   . Hypothyroidism   . Inguinal hernia   . Lumbar scoliosis   . Rectal bleeding    Surgical Hx: The patient  has a past surgical history that includes Appendectomy (1966); Coronary artery bypass graft (2004); Coronary angioplasty with stent; Inguinal hernia repair (1994); by-pass; lumbar laminotomy (10/2009); Colonoscopy; Polypectomy; Cataract extraction; and Femur IM nail (Left, 05/25/2014).   Current Medications: Current Meds  Medication Sig  . amLODipine (NORVASC) 5 MG tablet Take 1 tablet (5 mg total) by mouth daily.  Marland Kitchen aspirin EC 81 MG tablet Take 81 mg by mouth daily.  Marland Kitchen atorvastatin (LIPITOR) 20 MG tablet Take 1 tablet (20 mg total) by mouth daily. Please make overdue appt with Dr. Burt [name] before anymore refills. 1st attempt  . esomeprazole (NEXIUM 24HR) 20 MG capsule Take 20 mg by mouth daily at 12 noon.  . fexofenadine (ALLEGRA) 180 MG tablet Take 180 mg by mouth daily.    . folic acid (FOLVITE) A999333 MCG tablet Take 800 mcg by mouth daily.   Marland Kitchen gabapentin (NEURONTIN) 300 MG capsule Take 300 mg by mouth as directed.   Marland Kitchen levothyroxine (SYNTHROID, LEVOTHROID) 137 MCG tablet TAKE 1 TABLET BY MOUTH  DAILY BEFORE BREAKFAST  .  metFORMIN (GLUCOPHAGE-XR) 500 MG 24 hr tablet TAKE 3 TABLETS BY MOUTH AT  BEDTIME  . multivitamin (THERAGRAN) per tablet Take 1 tablet by mouth daily.    . niacin 500 MG tablet Take 1 tablet (500 mg total) by mouth at bedtime.  [name] [name] VERIO test strip USE AS DIRECTED TO CHECK  BLOOD SUGAR ONCE DAILY  . ramipril (ALTACE) 10 MG capsule Take 1 capsule (10 mg total) by mouth daily.  . traMADol (ULTRAM) 50 MG tablet Take 50 mg by mouth every 6 (six) hours as needed for severe pain (leg pain).     Allergies:   Sulfamethoxazole-trimethoprim, Sulfa antibiotics, and Niaspan [niacin er]   Social History   Tobacco Use  .  Smoking status: Former Smoker    Packs/day: 1.00    Years: 10.00    Pack years: 10.00    Types: Cigarettes    Quit date: 09/08/1973    Years since quitting: 45.8  . Smokeless tobacco: Never Used  Substance Use Topics  . Alcohol use: No  . Drug use: No     Family Hx: The patient's family history includes Colon cancer in his cousin; Diabetes in his mother; Heart disease in his father.  ROS:   Please see the history of present illness.    Review of Systems  Cardiovascular:       No pleuritic chest pain or chest pain with lying supine  Gastrointestinal: Negative for hematochezia and melena.  Genitourinary: Negative for hematuria.   All other systems reviewed and are negative.   EKGs/Labs/Other Test Reviewed:    EKG:  EKG is   ordered today.  The ekg ordered today demonstrates sinus rhythm, heart rate 89, PACs and atrial bigeminal pattern, nonspecific ST-T wave changes, QTC 386, no change from prior tracing  Recent Labs: 06/20/2019: ALT 14; BUN 22; Creatinine, Ser 1.47; Hemoglobin 10.7; Platelets 249.0; Potassium 5.0; Sodium 136; TSH 1.45   Recent Lipid Panel Lab Results  Component Value Date/Time   CHOL 110 06/20/2019 08:36 AM   TRIG 111.0 06/20/2019 08:36 AM   HDL 46.70 06/20/2019 08:36 AM   CHOLHDL 2 06/20/2019 08:36 AM   LDLCALC 41 06/20/2019 08:36 AM    Physical Exam:    VS:  BP 140/80   Pulse 89   Ht 5\' 7"  (1.702 m)   Wt 155 lb (70.3 kg)   BMI 24.28 kg/m     Wt Readings from Last 3 Encounters:  07/05/19 155 lb (70.3 kg)  06/20/19 152 lb 6.4 oz (69.1 kg)  10/04/18 160 lb 1.9 oz (72.6 kg)     Physical Exam  Constitutional: He is oriented to person, place, and time. He appears well-developed and well-nourished. No distress.  HENT:  Head: Normocephalic and atraumatic.  Eyes: No scleral icterus.  Neck: No JVD present. No thyromegaly present.  Cardiovascular: Normal rate, S1 normal, S2 normal and normal heart sounds. An irregular rhythm present.  No murmur  heard. Pulmonary/Chest: Effort normal and breath sounds normal. He has no rales.  Abdominal: Soft. There is no hepatomegaly.  Musculoskeletal:        General: No edema.  Lymphadenopathy:    He has no cervical adenopathy.  Neurological: He is alert and oriented to person, place, and time.  Skin: Skin is warm and dry.  Psychiatric: He has a normal mood and affect.    ASSESSMENT & PLAN:    1. Coronary artery disease involving coronary bypass graft of native heart with angina pectoris [facility]) Prior history of  CABG.  Myoview 2017 was low risk and negative for ischemia.  Over the past 2 months, he has had left-sided chest discomfort that is similar to his previous angina.  It is not as severe.  He has not experienced pain like this since his heart attack in 2004.  His ECG does not demonstrate any ischemic changes.  He is able to exert himself without chest discomfort during activity.  His symptoms usually come on after.  He has no associated symptoms.  He has newly recognized anemia.  His hemoglobin in April 2010 was 14.4.  It is now 10.7.  His previous MCV was 91.8.  It is now 78.7.  This raises the question of whether or not he may be iron deficient with slow blood loss.  We discussed the rationale for proceeding with cardiac catheterization versus nuclear stress test.  As his diagnosis of anemia is new and work-up is ongoing, I have recommended that we proceed with stress testing initially.  We will also adjust his antianginal regimen.  If the stress test is high risk or his symptoms worsen, we will need to proceed with cardiac catheterization.  -Continue amlodipine, aspirin, atorvastatin  -Start metoprolol succinate 25 mg daily  -Prescription for as needed nitroglycerin  -Arrange Lexiscan Myoview  -Follow-up with Dr. Burt [name] or me in 3-4 weeks  2. Essential hypertension Blood pressure at home is typically optimal.  Continue current dose of amlodipine, Ramipril.  3. Mixed hyperlipidemia LDL  optimal on most recent lab work.  Continue current Rx.    4. Anemia, unspecified type As noted, work-up is being initiated with primary care later today.  5. Stage 3a chronic kidney disease Recent creatinine 1.47.   Dispo:  Return in about 4 weeks (around 08/02/2019) for Follow up after testing, w/ Dr. Burt [name], or Richardson Dopp, PA-C, in person.   Medication Adjustments/Labs and Tests Ordered: Current medicines are reviewed at length with the patient today.  Concerns regarding medicines are outlined above.  Tests Ordered: Orders Placed This Encounter  Procedures  . MYOCARDIAL PERFUSION IMAGING  . EKG 12-Lead   Medication Changes: Meds ordered this encounter  Medications  . metoprolol succinate (TOPROL XL) 25 MG 24 hr tablet    Sig: Take 1 tablet (25 mg total) by mouth daily.    Dispense:  90 tablet    Refill:  3  . nitroGLYCERIN (NITROSTAT) 0.4 MG SL tablet    Sig: Place 1 tablet (0.4 mg total) under the tongue every 5 (five) minutes as needed for chest pain.    Dispense:  25 tablet    Refill:  3    Signed, Richardson Dopp, PA-C  07/05/2019 10:43 AM    [address], [place], [place]  [address] Phone: [phone]; Fax: [phone]

## 2019-07-05 ENCOUNTER — Encounter: Payer: Self-pay | Admitting: *Deleted

## 2019-07-05 ENCOUNTER — Encounter: Payer: Self-pay | Admitting: Physician Assistant

## 2019-07-05 ENCOUNTER — Other Ambulatory Visit: Payer: Self-pay

## 2019-07-05 ENCOUNTER — Ambulatory Visit: Payer: Medicare Other | Admitting: Physician Assistant

## 2019-07-05 VITALS — BP 140/80 | HR 89 | Ht 67.0 in | Wt 155.0 lb

## 2019-07-05 DIAGNOSIS — E782 Mixed hyperlipidemia: Secondary | ICD-10-CM

## 2019-07-05 DIAGNOSIS — N1831 Chronic kidney disease, stage 3a: Secondary | ICD-10-CM

## 2019-07-05 DIAGNOSIS — I25709 Atherosclerosis of coronary artery bypass graft(s), unspecified, with unspecified angina pectoris: Secondary | ICD-10-CM

## 2019-07-05 DIAGNOSIS — I10 Essential (primary) hypertension: Secondary | ICD-10-CM | POA: Diagnosis not present

## 2019-07-05 DIAGNOSIS — D649 Anemia, unspecified: Secondary | ICD-10-CM | POA: Diagnosis not present

## 2019-07-05 MED ORDER — NITROGLYCERIN 0.4 MG SL SUBL: 0.4000 mg | SUBLINGUAL_TABLET | SUBLINGUAL | 3 refills | Status: AC | PRN

## 2019-07-05 MED ORDER — METOPROLOL SUCCINATE ER 25 MG PO TB24: 25.0000 mg | ORAL_TABLET | Freq: Every day | ORAL | 3 refills | Status: DC

## 2019-07-05 NOTE — Patient Instructions (Signed)
Medication Instructions:  1) START Metoprolol Succinate 25mg  once daily 2)  A prescription has been sent in for Nitroglycerin.  If you have chest pain that doesn't relieve quickly, place one tablet under your tongue and allow it to dissolve.  If no relief after 5 minutes, you may take another pill.  If no relief after 5 minutes, you may take a 3rd dose but you need to call 911 and report to ER immediately.  *If you need a refill on your cardiac medications before your next appointment, please call your pharmacy*  Lab Work: None If you have labs (blood work) drawn today and your tests are completely normal, you will receive your results only by: Marland Kitchen MyChart Message (if you have MyChart) OR . A paper copy in the mail If you have any lab test that is abnormal or we need to change your treatment, we will call you to review the results.  Testing/Procedures: Your physician has requested that you have a lexiscan myoview. For further information please visit [email or web address]. Please follow instruction sheet, as given.   Follow-Up: At [facility], you and your health needs are our priority.  As part of our continuing mission to provide you with exceptional heart care, we have created designated Provider Care Teams.  These Care Teams include your primary Cardiologist (physician) and Advanced Practice Providers (APPs -  Physician Assistants and Nurse Practitioners) who all work together to provide you with the care you need, when you need it.  Your next appointment:   3-4 weeks  The format for your next appointment:   In Person  Provider:   You may see Sherren Mocha, MD or one of the following Advanced Practice Providers on your designated Care Team:    Richardson Dopp, PA-C  [place], [place]  Daune Perch, NP   Other Instructions

## 2019-07-07 ENCOUNTER — Telehealth (HOSPITAL_COMMUNITY): Payer: Self-pay

## 2019-07-07 NOTE — Telephone Encounter (Signed)
Attempted to reach the patient to leave his instructions for his stress test. He had no working mailboxes for any of his contact numbers. S.Arnita Koons EMTP

## 2019-07-12 ENCOUNTER — Ambulatory Visit (HOSPITAL_COMMUNITY): Payer: Medicare Other | Attending: Cardiovascular Disease

## 2019-07-12 ENCOUNTER — Other Ambulatory Visit: Payer: Self-pay

## 2019-07-12 DIAGNOSIS — I25709 Atherosclerosis of coronary artery bypass graft(s), unspecified, with unspecified angina pectoris: Secondary | ICD-10-CM | POA: Diagnosis present

## 2019-07-12 LAB — MYOCARDIAL PERFUSION IMAGING
LV dias vol: 96 mL (ref 62–150)
LV sys vol: 47 mL
Peak HR: 90 {beats}/min
Rest HR: 67 {beats}/min
SDS: 4
SRS: 1
SSS: 5
TID: 1.25

## 2019-07-12 MED ORDER — REGADENOSON 0.4 MG/5ML IV SOLN
0.4000 mg | Freq: Once | INTRAVENOUS | Status: AC
  Administered 2019-07-12: 0.4 mg via INTRAVENOUS

## 2019-07-12 MED ORDER — TECHNETIUM TC 99M TETROFOSMIN IV KIT
32.3000 | PACK | Freq: Once | INTRAVENOUS | Status: AC | PRN
  Administered 2019-07-12: 32.3 via INTRAVENOUS
  Filled 2019-07-12: qty 33

## 2019-07-12 MED ORDER — TECHNETIUM TC 99M TETROFOSMIN IV KIT
10.9000 | PACK | Freq: Once | INTRAVENOUS | Status: AC | PRN
  Administered 2019-07-12: 10.9 via INTRAVENOUS
  Filled 2019-07-12: qty 11

## 2019-07-13 ENCOUNTER — Encounter: Payer: Self-pay | Admitting: Physician Assistant

## 2019-08-02 NOTE — Progress Notes (Signed)
Cardiology Office Note:    Date:  08/03/2019   ID:  [name] [name], [name] [name] 1946-06-19, MRN [id number]  PCP:  Aurea Graff.Marlou Sa, MD  Cardiologist:  Sherren Mocha, MD   Electrophysiologist:  None   Referring MD: Aurea Graff.Marlou Sa, MD   Chief Complaint  Patient presents with  . Follow-up    chest pain     History of Present Illness:    [name] [name] is a 73 y.o. male with:   Coronary artery disease  ? S/p CABG  Atrial bigeminy   Hyperlipidemia   Hypertension   Diabetes mellitus 2  GERD  Hypothyroidism  Chronic kidney disease   [name] [name] was seen last in October 2020 with complaints of chest discomfort that occurred after activity.  He had recently been diagnosed with anemia and had follow-up with primary care pending.  I added metoprolol succinate to his medical regimen.  I also arrange for a nuclear stress test which demonstrated normal LV function and no ischemia.  He returns for follow-up.  He is here alone.  Since last seen, his PCP placed him on iron therapy.  He has gradually started to feel much better.  He has only had one episode of chest discomfort in the last few weeks.  He has not had shortness of breath, syncope, leg swelling.     Prior CV studies:   The following studies were reviewed today:  Myoview 07/12/2019 EF 51 (visually EF appears to be 55-60), no ischemia or infarction; low risk  Event monitor 08/12/17 Sinus rhythm with single PVC's present  Myoview 03/27/16 Low risk stress nuclear study with inferior thinning but no ischemia; EF 56 with normal wall motion.  VAS Screen 02/2016    Past Medical History:  Diagnosis Date  . Allergy   . AMI (acute mesenteric ischemia) ([place])   . Arthritis, lumbar spine   . Cataract   . Colon polyps   . Coronary artery disease 01/24/2011   S/p CABG // Myoview 07/2019: EF 51 (visually appears 55-60), no ischemia or infarction, Low Risk  . Diabetes mellitus without complication ([place])   . Diverticulosis   .  GERD (gastroesophageal reflux disease)   . Heart attack ([place]) 2004  . Hemorrhoids   . HTN (hypertension)   . Hypothyroidism   . Inguinal hernia   . Lumbar scoliosis   . Rectal bleeding    Surgical Hx: The patient  has a past surgical history that includes Appendectomy (1966); Coronary artery bypass graft (2004); Coronary angioplasty with stent; Inguinal hernia repair (1994); by-pass; lumbar laminotomy (10/2009); Colonoscopy; Polypectomy; Cataract extraction; and Femur IM nail (Left, 05/25/2014).   Current Medications: Current Meds  Medication Sig  . amLODipine (NORVASC) 5 MG tablet Take 1 tablet (5 mg total) by mouth daily.  Marland Kitchen aspirin EC 81 MG tablet Take 81 mg by mouth daily.  Marland Kitchen atorvastatin (LIPITOR) 20 MG tablet Take 1 tablet (20 mg total) by mouth daily. Please make overdue appt with Dr. Burt Knack before anymore refills. 1st attempt  . esomeprazole (NEXIUM 24HR) 20 MG capsule Take 20 mg by mouth daily at 12 noon.  . fexofenadine (ALLEGRA) 180 MG tablet Take 180 mg by mouth daily.    . folic acid (FOLVITE) A999333 MCG tablet Take 800 mcg by mouth daily.   Marland Kitchen gabapentin (NEURONTIN) 300 MG capsule Take 300 mg by mouth as directed.   Marland Kitchen levothyroxine (SYNTHROID, LEVOTHROID) 137 MCG tablet TAKE 1 TABLET BY MOUTH  DAILY BEFORE BREAKFAST  . metFORMIN (GLUCOPHAGE-XR) 500  MG 24 hr tablet TAKE 3 TABLETS BY MOUTH AT  BEDTIME  . metoprolol succinate (TOPROL XL) 25 MG 24 hr tablet Take 1 tablet (25 mg total) by mouth daily.  . multivitamin (THERAGRAN) per tablet Take 1 tablet by mouth daily.    . niacin 500 MG tablet Take 1 tablet (500 mg total) by mouth at bedtime.  . nitroGLYCERIN (NITROSTAT) 0.4 MG SL tablet Place 1 tablet (0.4 mg total) under the tongue every 5 (five) minutes as needed for chest pain.  Glory Rosebush VERIO test strip USE AS DIRECTED TO CHECK  BLOOD SUGAR ONCE DAILY  . ramipril (ALTACE) 10 MG capsule Take 1 capsule (10 mg total) by mouth daily.  . traMADol (ULTRAM) 50 MG tablet Take 50 mg  by mouth every 6 (six) hours as needed for severe pain (leg pain).     Allergies:   Sulfamethoxazole-trimethoprim, Sulfa antibiotics, and Niaspan [niacin er]   Social History   Tobacco Use  . Smoking status: Former Smoker    Packs/day: 1.00    Years: 10.00    Pack years: 10.00    Types: Cigarettes    Quit date: 09/08/1973    Years since quitting: 45.9  . Smokeless tobacco: Never Used  Substance Use Topics  . Alcohol use: No  . Drug use: No     Family Hx: The patient's family history includes Colon cancer in his cousin; Diabetes in his mother; Heart disease in his father.  ROS:   Please see the history of present illness.    ROS All other systems reviewed and are negative.   EKGs/Labs/Other Test Reviewed:    EKG:  EKG is not ordered today.  The ekg ordered today demonstrates n/a  Recent Labs: 06/20/2019: ALT 14; BUN 22; Creatinine, Ser 1.47; Hemoglobin 10.7; Platelets 249.0; Potassium 5.0; Sodium 136; TSH 1.45   Recent Lipid Panel Lab Results  Component Value Date/Time   CHOL 110 06/20/2019 08:36 AM   TRIG 111.0 06/20/2019 08:36 AM   HDL 46.70 06/20/2019 08:36 AM   CHOLHDL 2 06/20/2019 08:36 AM   LDLCALC 41 06/20/2019 08:36 AM    Physical Exam:    VS:  BP 110/64   Pulse 60   Ht 5\' 7"  (1.702 m)   Wt 154 lb (69.9 kg)   SpO2 96%   BMI 24.12 kg/m     Wt Readings from Last 3 Encounters:  08/03/19 154 lb (69.9 kg)  07/12/19 155 lb (70.3 kg)  07/05/19 155 lb (70.3 kg)     Physical Exam  Constitutional: He is oriented to person, place, and time. He appears well-developed and well-nourished. No distress.  HENT:  Head: Normocephalic and atraumatic.  Neck: Neck supple. No JVD present.  Cardiovascular: Normal rate, S1 normal and S2 normal. An irregular rhythm present.  No murmur heard. Pulmonary/Chest: Breath sounds normal. He has no rales.  Abdominal: Soft. There is no hepatomegaly.  Musculoskeletal:        General: No edema.  Neurological: He is alert and  oriented to person, place, and time.  Skin: Skin is warm and dry.    ASSESSMENT & PLAN:    1. Coronary artery disease involving coronary bypass graft of native heart with angina pectoris ([place]) History of CABG.  Recent Myoview negative for ischemia and considered a low risk study.  He has been placed on iron therapy by his primary care physician.  He has felt better since that time.  Question if beta-blocker therapy has also provided benefit.  Since he is improved, no further testing is indicated at this time.  Continue amlodipine, aspirin, atorvastatin, metoprolol.  Follow-up with Dr. Burt Knack in 6 months or sooner if needed.  2. Anemia, unspecified type Follow-up with primary care.  He is currently on iron therapy.  3. Atrial bigeminy His rhythm is irregular on exam.  I suspect he still has atrial bigeminy despite the initiation of beta-blocker therapy.  Luckily, he is asymptomatic.   Dispo:  Return in about 6 months (around 01/31/2020) for Routine Follow Up, w/ Dr. Burt Knack, (virtual or in-person).   Medication Adjustments/Labs and Tests Ordered: Current medicines are reviewed at length with the patient today.  Concerns regarding medicines are outlined above.  Tests Ordered: No orders of the defined types were placed in this encounter.  Medication Changes: No orders of the defined types were placed in this encounter.   Signed, Richardson Dopp, PA-C  08/03/2019 9:38 AM    [facility] [address], [place], [place]  [address] Phone: [phone]; Fax: [phone]

## 2019-08-03 ENCOUNTER — Other Ambulatory Visit: Payer: Self-pay

## 2019-08-03 ENCOUNTER — Encounter: Payer: Self-pay | Admitting: Physician Assistant

## 2019-08-03 ENCOUNTER — Ambulatory Visit: Payer: Medicare Other | Admitting: Physician Assistant

## 2019-08-03 VITALS — BP 110/64 | HR 60 | Ht 67.0 in | Wt 154.0 lb

## 2019-08-03 DIAGNOSIS — D649 Anemia, unspecified: Secondary | ICD-10-CM | POA: Diagnosis not present

## 2019-08-03 DIAGNOSIS — I25709 Atherosclerosis of coronary artery bypass graft(s), unspecified, with unspecified angina pectoris: Secondary | ICD-10-CM | POA: Diagnosis not present

## 2019-08-03 DIAGNOSIS — I498 Other specified cardiac arrhythmias: Secondary | ICD-10-CM

## 2019-08-03 NOTE — Patient Instructions (Signed)
Medication Instructions:  Your physician recommends that you continue on your current medications as directed. Please refer to the Current Medication list given to you today.  *If you need a refill on your cardiac medications before your next appointment, please call your pharmacy*  Lab Work: None   If you have labs (blood work) drawn today and your tests are completely normal, you will receive your results only by: Marland Kitchen MyChart Message (if you have MyChart) OR . A paper copy in the mail If you have any lab test that is abnormal or we need to change your treatment, we will call you to review the results.  Testing/Procedures: None   Follow-Up: At [facility], you and your health needs are our priority.  As part of our continuing mission to provide you with exceptional heart care, we have created designated Provider Care Teams.  These Care Teams include your primary Cardiologist (physician) and Advanced Practice Providers (APPs -  Physician Assistants and Nurse Practitioners) who all work together to provide you with the care you need, when you need it.  Your next appointment:   6 month(s)  The format for your next appointment:   In Person  Provider:   You may see Sherren Mocha, MD or one of the following Advanced Practice Providers on your designated Care Team:    Richardson Dopp, PA-C  [place], [place]  Daune Perch, NP   Other Instructions None

## 2019-08-04 ENCOUNTER — Other Ambulatory Visit: Payer: Self-pay | Admitting: Cardiovascular Disease

## 2019-08-11 ENCOUNTER — Other Ambulatory Visit: Payer: Self-pay | Admitting: Cardiovascular Disease

## 2019-10-17 ENCOUNTER — Other Ambulatory Visit: Payer: Self-pay | Admitting: Endocrinology

## 2019-10-17 DIAGNOSIS — D509 Iron deficiency anemia, unspecified: Secondary | ICD-10-CM

## 2019-10-24 ENCOUNTER — Other Ambulatory Visit: Payer: Self-pay

## 2019-10-24 ENCOUNTER — Other Ambulatory Visit (INDEPENDENT_AMBULATORY_CARE_PROVIDER_SITE_OTHER): Payer: Medicare Other

## 2019-10-24 DIAGNOSIS — D509 Iron deficiency anemia, unspecified: Secondary | ICD-10-CM | POA: Diagnosis not present

## 2019-10-24 DIAGNOSIS — E119 Type 2 diabetes mellitus without complications: Secondary | ICD-10-CM | POA: Diagnosis not present

## 2019-10-24 LAB — CBC
HCT: 41.1 % (ref 39.0–52.0)
Hemoglobin: 13.8 g/dL (ref 13.0–17.0)
MCHC: 33.7 g/dL (ref 30.0–36.0)
MCV: 87.7 fl (ref 78.0–100.0)
Platelets: 260 10*3/uL (ref 150.0–400.0)
RBC: 4.69 Mil/uL (ref 4.22–5.81)
RDW: 18.8 % — ABNORMAL HIGH (ref 11.5–15.5)
WBC: 9.5 10*3/uL (ref 4.0–10.5)

## 2019-10-24 LAB — BASIC METABOLIC PANEL
BUN: 21 mg/dL (ref 6–23)
CO2: 27 mEq/L (ref 19–32)
Calcium: 9.4 mg/dL (ref 8.4–10.5)
Chloride: 99 mEq/L (ref 96–112)
Creatinine, Ser: 1.37 mg/dL (ref 0.40–1.50)
GFR: 50.8 mL/min — ABNORMAL LOW (ref >60.00)
Glucose, Bld: 87 mg/dL (ref 70–99)
Potassium: 4.4 mEq/L (ref 3.5–5.1)
Sodium: 134 mEq/L — ABNORMAL LOW (ref 135–145)

## 2019-10-24 LAB — FERRITIN: Ferritin: 16.9 ng/mL — ABNORMAL LOW (ref 22.0–322.0)

## 2019-10-24 LAB — HEMOGLOBIN A1C: Hgb A1c MFr Bld: 6.3 % (ref 4.6–6.5)

## 2019-10-25 NOTE — Progress Notes (Signed)
Patient ID: [name] [name], male   DOB: 06-Mar-1946, 74 y.o.   MRN: [id number]    Reason for Appointment: Endocrinology follow-up  History of Present Illness   HYPOTHYROIDISM, primary: This is long-standing and has been on levothyroxine supplementation  In 12/2017 his levothyroxine was increased from 125 up to 137 mcg daily Subsequently has had normal TSH levels  Previously had lost weight but this appears to be leveling off He feels fairly good overall  Labs as follows:  Lab Results  Component Value Date   TSH 1.45 06/20/2019   TSH 1.30 01/13/2019   TSH 0.93 06/15/2018   FREET4 1.34 06/20/2019   FREET4 1.16 03/01/2018   FREET4 1.02 12/14/2017    PROBLEM 2:   Diagnosis: Type 2 DIABETES MELITUS, date of diagnosis: 2009     Previous history: He has been treated with metformin monotherapy for several years with stable control Unable to exercise much because of chronic back pain  Recent history:   His A1c is relatively higher at 6.3 compared to 5.5 This may have been lower previously because of anemia  Oral hypoglycemic drugs:   metformin ER 1500 mg pcs      Current management, blood sugar readings and problems identified:   Blood sugars are excellent at home again although still monitoring mostly in the morning  He has done only a couple of readings after meals in the evening  Highest blood sugar after lunch at 162  No side effects with Metformin which he takes after evening meal  His weight has leveled off  As before has difficulty doing much exercise because of pain and fatigue  Usually watching his meals and eating healthy foods and portions      Side effects from medications: None       Monitors blood glucose:  about once a day or less .    Glucometer:  One Touch        Blood Glucose readings from download:   PRE-MEAL  mornings Lunch Dinner Bedtime Overall  Glucose range:  84-117      Mean/median:      112   POST-MEAL PC Breakfast  PC Lunch PC Dinner  Glucose range:   106-162  108, 119  Mean/median:       PREVIOUS range 84-174.  Overall AVERAGE 116  Physical activity: not able to do much sustained activity or walking   Dietician visit: Most recent: 11/2008    Weight control:  Wt Readings from Last 3 Encounters:  10/26/19 155 lb (70.3 kg)  08/03/19 154 lb (69.9 kg)  07/12/19 155 lb (70.3 kg)        Eye exam has been done annually   Diabetes labs:  Lab Results  Component Value Date   HGBA1C 6.3 10/24/2019   HGBA1C 5.5 06/20/2019   HGBA1C 5.7 (A) 12/15/2018   Lab Results  Component Value Date   MICROALBUR <0.7 01/13/2019   LDLCALC 41 06/20/2019   CREATININE 1.37 10/24/2019      Allergies as of 10/26/2019      Reactions   Sulfamethoxazole-trimethoprim Other (See Comments)   "Extreme discomfort"   Sulfa Antibiotics Rash   Niaspan [niacin Er] Hives      Medication List       Accurate as of October 26, 2019 11:59 PM. If you have any questions, ask your nurse or doctor.        amLODipine 5 MG tablet Commonly known as: NORVASC TAKE 1 TABLET  BY MOUTH  DAILY   aspirin EC 81 MG tablet Take 81 mg by mouth daily.   atorvastatin 20 MG tablet Commonly known as: LIPITOR Take 1 tablet (20 mg total) by mouth daily at 6 PM.   fexofenadine 180 MG tablet Commonly known as: ALLEGRA Take 180 mg by mouth daily.   folic acid A999333 MCG tablet Commonly known as: FOLVITE Take 800 mcg by mouth daily.   gabapentin 300 MG capsule Commonly known as: NEURONTIN Take 300 mg by mouth as directed.   levothyroxine 137 MCG tablet Commonly known as: SYNTHROID TAKE 1 TABLET BY MOUTH  DAILY BEFORE BREAKFAST   metFORMIN 500 MG 24 hr tablet Commonly known as: GLUCOPHAGE-XR TAKE 3 TABLETS BY MOUTH AT  BEDTIME   metoprolol succinate 25 MG 24 hr tablet Commonly known as: Toprol XL Take 1 tablet (25 mg total) by mouth daily.   multivitamin per tablet Take 1 tablet by mouth daily.   NexIUM 24HR 20 MG  capsule Generic drug: esomeprazole Take 20 mg by mouth daily at 12 noon.   niacin 500 MG tablet Take 1 tablet (500 mg total) by mouth at bedtime.   nitroGLYCERIN 0.4 MG SL tablet Commonly known as: NITROSTAT Place 1 tablet (0.4 mg total) under the tongue every 5 (five) minutes as needed for chest pain.   OneTouch Verio test strip Generic drug: glucose blood USE AS DIRECTED TO CHECK  BLOOD SUGAR ONCE DAILY   ramipril 10 MG capsule Commonly known as: ALTACE TAKE 1 CAPSULE BY MOUTH  DAILY   traMADol 50 MG tablet Commonly known as: ULTRAM Take 50 mg by mouth every 6 (six) hours as needed for severe pain (leg pain).       Allergies:  Allergies  Allergen Reactions  . Sulfamethoxazole-Trimethoprim Other (See Comments)    "Extreme discomfort"  . Sulfa Antibiotics Rash  . Niaspan [Niacin Er] Hives    Past Medical History:  Diagnosis Date  . Allergy   . AMI (acute mesenteric ischemia) ([place])   . Arthritis, lumbar spine   . Cataract   . Colon polyps   . Coronary artery disease 01/24/2011   S/p CABG // Myoview 07/2019: EF 51 (visually appears 55-60), no ischemia or infarction, Low Risk  . Diabetes mellitus without complication ([place])   . Diverticulosis   . GERD (gastroesophageal reflux disease)   . Heart attack ([place]) 2004  . Hemorrhoids   . HTN (hypertension)   . Hypothyroidism   . Inguinal hernia   . Lumbar scoliosis   . Rectal bleeding     Past Surgical History:  Procedure Laterality Date  . APPENDECTOMY  1966  . by-pass    . CATARACT EXTRACTION     Bil  /   11/2012  . COLONOSCOPY    . CORONARY ANGIOPLASTY WITH STENT PLACEMENT    . CORONARY ARTERY BYPASS GRAFT  2004   x 5 / one stent  . FEMUR IM NAIL Left 05/25/2014   Procedure: INTRAMEDULLARY (IM) NAIL FEMORAL WITH CABLES;  Surgeon: Meredith Pel, MD;  Location: [facility];  Service: Orthopedics;  Laterality: Left;  . INGUINAL HERNIA REPAIR  1994   right side  . lumbar laminotomy  10/2009  . POLYPECTOMY       Family History  Problem Relation Age of Onset  . Diabetes Mother   . Heart disease Father   . Colon cancer Cousin     Social History:  reports that he quit smoking about 46 years ago. His smoking  use included cigarettes. He has a 10.00 pack-year smoking history. He has never used smokeless tobacco. He reports that he does not drink alcohol or use drugs.  Review of Systems:  Lipids: LDL has been at target of under 70 on a regimen of Lipitor 20 mg daily prescribed by his cardiologist.   Also has been taking niacin for low normal HDL    Lab Results  Component Value Date   CHOL 110 06/20/2019   HDL 46.70 06/20/2019   LDLCALC 41 06/20/2019   TRIG 111.0 06/20/2019   CHOLHDL 2 06/20/2019   Renal dysfunction: Stable, he is on 10 mg ramipril  Lab Results  Component Value Date   CREATININE 1.37 10/24/2019   CREATININE 1.47 06/20/2019   CREATININE 1.46 01/13/2019     Foot exam done in 2/21, has normal sensory exam  Eye exam 03/2019, negative    Examination:   BP 128/72 (BP Location: Left Arm, Patient Position: Sitting, Cuff Size: Normal)   Pulse 87   Ht 5\' 7"  (1.702 m)   Wt 155 lb (70.3 kg)   SpO2 98%   BMI 24.28 kg/m   Body mass index is 24.28 kg/m.   Diabetic Foot Exam - Simple   Simple Foot Form Diabetic Foot exam was performed with the following findings: Yes   Visual Inspection No deformities, no ulcerations, no other skin breakdown bilaterally: Yes Sensation Testing Intact to touch and monofilament testing bilaterally: Yes Pulse Check Posterior Tibialis and Dorsalis pulse intact bilaterally: Yes Comments      ASSESSMENT/ PLAN:    Diabetes type 2, nonobese:  A1c is excellent at 6.3  He is on the same regimen with extended release metformin 1500 mg daily for quite some time Also last year had lost weight which he has maintained Blood sugars are excellent at home when he checks readings after meals also Not able to exercise much because of various  limitations  He will continue the same regimen  Foot exam does not show any signs of neuropathy or vascular disease  Hypothyroidism, primary and long-standing:   He is taking 137 mcg levothyroxine and his last TSH was normal Subjectively doing well and he will have a follow-up on the next visit  History of weight loss: Resolved  Lipids: Well-controlled as of 10/20  Renal dysfunction: His creatinine has leveled off and now slightly better at 1.37 Since blood pressure is also fairly good he can continue the same dose of ramipril  Iron deficiency anemia: He has questions about iron therapy and this was discussed.  Since he is intolerant to current iron preparation he can discuss alternatives with his PCP.  Currently hemoglobin back to normal  Follow-up in 6 months  Elayne Snare 10/27/2019, 9:46 AM

## 2019-10-26 ENCOUNTER — Encounter: Payer: Self-pay | Admitting: Endocrinology

## 2019-10-26 ENCOUNTER — Other Ambulatory Visit: Payer: Self-pay

## 2019-10-26 ENCOUNTER — Ambulatory Visit: Payer: Medicare Other | Admitting: Endocrinology

## 2019-10-26 VITALS — BP 128/72 | HR 87 | Ht 67.0 in | Wt 155.0 lb

## 2019-10-26 DIAGNOSIS — E063 Autoimmune thyroiditis: Secondary | ICD-10-CM

## 2019-10-26 DIAGNOSIS — N182 Chronic kidney disease, stage 2 (mild): Secondary | ICD-10-CM | POA: Diagnosis not present

## 2019-10-26 DIAGNOSIS — E782 Mixed hyperlipidemia: Secondary | ICD-10-CM | POA: Diagnosis not present

## 2019-10-26 DIAGNOSIS — E119 Type 2 diabetes mellitus without complications: Secondary | ICD-10-CM | POA: Diagnosis not present

## 2019-12-25 ENCOUNTER — Other Ambulatory Visit: Payer: Self-pay | Admitting: Endocrinology

## 2020-01-02 MED ORDER — METOPROLOL SUCCINATE ER 25 MG PO TB24: 25.0000 mg | ORAL_TABLET | Freq: Every day | ORAL | 3 refills | Status: DC

## 2020-02-02 ENCOUNTER — Telehealth: Payer: Self-pay | Admitting: Cardiovascular Disease

## 2020-02-02 NOTE — Telephone Encounter (Signed)
Pt sent MyChart Message to request appointment with Dr. Burt Knack. I reached out to the patient to let him know that Dr. Antionette Char schedule was full through the end of September and offered him a slot with the PA. He said he has seen the PA for the past two visits and would prefer to see Dr. Burt Knack

## 2020-02-03 NOTE — Telephone Encounter (Signed)
See MyChart messages. The patient has been scheduled 7/1.

## 2020-03-08 ENCOUNTER — Encounter: Payer: Self-pay | Admitting: Cardiovascular Disease

## 2020-03-08 ENCOUNTER — Ambulatory Visit: Payer: Medicare Other | Admitting: Cardiovascular Disease

## 2020-03-08 ENCOUNTER — Other Ambulatory Visit: Payer: Self-pay

## 2020-03-08 VITALS — BP 110/62 | HR 63 | Ht 67.0 in | Wt 153.0 lb

## 2020-03-08 DIAGNOSIS — I2510 Atherosclerotic heart disease of native coronary artery without angina pectoris: Secondary | ICD-10-CM | POA: Diagnosis not present

## 2020-03-08 DIAGNOSIS — E782 Mixed hyperlipidemia: Secondary | ICD-10-CM | POA: Diagnosis not present

## 2020-03-08 DIAGNOSIS — N1831 Chronic kidney disease, stage 3a: Secondary | ICD-10-CM

## 2020-03-08 DIAGNOSIS — I10 Essential (primary) hypertension: Secondary | ICD-10-CM

## 2020-03-08 NOTE — Patient Instructions (Signed)

## 2020-03-08 NOTE — Progress Notes (Signed)
Cardiology Office Note:    Date:  03/08/2020   ID:  [name], Puls [birth year], MRN [id number]  PCP:  Aurea Graff.Marlou Sa, [facility] Cardiologist:  Sherren Mocha, MD  [place] Electrophysiologist:  None   Referring MD: Alroy Dust, Carlean Jews.Marlou Sa, MD   Chief Complaint  Patient presents with  . Coronary Artery Disease    History of Present Illness:    [name] [name] is a 74 y.o. male with a hx of:  Coronary artery disease  ? S/p CABG  Atrial bigeminy   Hyperlipidemia   Hypertension   Diabetes mellitus 2  GERD  Hypothyroidism  I saw the patient last in January 2020. He was evaluated at the end of last year for atypical chest pain and underwent a stress Myoview scan and was negative. His symptoms have completely resolved. The patient is here alone today. He is doing well from a cardiac perspective and denies any symptoms of chest pain, shortness of breath, heart palpitations, leg swelling, orthopnea, or PND. He remains functionally limited because of back pain.  Past Medical History:  Diagnosis Date  . Allergy   . AMI (acute mesenteric ischemia) ([place])   . Arthritis, lumbar spine   . Cataract   . Colon polyps   . Coronary artery disease 01/24/2011   S/p CABG // Myoview 07/2019: EF 51 (visually appears 55-60), no ischemia or infarction, Low Risk  . Diabetes mellitus without complication ([place])   . Diverticulosis   . GERD (gastroesophageal reflux disease)   . Heart attack ([place]) 2004  . Hemorrhoids   . HTN (hypertension)   . Hypothyroidism   . Inguinal hernia   . Lumbar scoliosis   . Rectal bleeding     Past Surgical History:  Procedure Laterality Date  . APPENDECTOMY  1966  . by-pass    . CATARACT EXTRACTION     Bil  /   11/2012  . COLONOSCOPY    . CORONARY ANGIOPLASTY WITH STENT PLACEMENT    . CORONARY ARTERY BYPASS GRAFT  2004   x 5 / one stent  . FEMUR IM NAIL Left 05/25/2014   Procedure: INTRAMEDULLARY (IM) NAIL FEMORAL WITH CABLES;  Surgeon:  Meredith Pel, MD;  Location: [facility];  Service: Orthopedics;  Laterality: Left;  . INGUINAL HERNIA REPAIR  1994   right side  . lumbar laminotomy  10/2009  . POLYPECTOMY      Current Medications: Current Meds  Medication Sig  . amLODipine (NORVASC) 5 MG tablet TAKE 1 TABLET BY MOUTH  DAILY  . aspirin EC 81 MG tablet Take 81 mg by mouth daily.  Marland Kitchen atorvastatin (LIPITOR) 20 MG tablet Take 1 tablet (20 mg total) by mouth daily at 6 PM.  . esomeprazole (NEXIUM 24HR) 20 MG capsule Take 20 mg by mouth daily at 12 noon.  . Ferrous Sulfate (SLOW FE) 142 (45 Fe) MG TBCR Take 1 tablet by mouth daily.  . fexofenadine (ALLEGRA) 180 MG tablet Take 180 mg by mouth daily.    . folic acid (FOLVITE) 700 MCG tablet Take 800 mcg by mouth daily.   Marland Kitchen gabapentin (NEURONTIN) 300 MG capsule Take 300 mg by mouth as directed.   Marland Kitchen levothyroxine (SYNTHROID) 137 MCG tablet TAKE 1 TABLET BY MOUTH  DAILY BEFORE BREAKFAST  . metFORMIN (GLUCOPHAGE-XR) 500 MG 24 hr tablet TAKE 3 TABLETS BY MOUTH AT  BEDTIME  . metoprolol succinate (TOPROL XL) 25 MG 24 hr tablet Take 1 tablet (25 mg total) by mouth daily.  Marland Kitchen  multivitamin (THERAGRAN) per tablet Take 1 tablet by mouth daily.    . niacin 500 MG tablet Take 1 tablet (500 mg total) by mouth at bedtime.  . nitroGLYCERIN (NITROSTAT) 0.4 MG SL tablet Place 1 tablet (0.4 mg total) under the tongue every 5 (five) minutes as needed for chest pain.  Glory Rosebush VERIO test strip USE AS DIRECTED TO CHECK  BLOOD SUGAR ONCE DAILY  . ramipril (ALTACE) 10 MG capsule TAKE 1 CAPSULE BY MOUTH  DAILY  . traMADol (ULTRAM) 50 MG tablet Take 50 mg by mouth every 6 (six) hours as needed for severe pain (leg pain).     Allergies:   Sulfamethoxazole-trimethoprim, Sulfa antibiotics, and Niaspan [niacin er]   Social History   Socioeconomic History  . Marital status: Married    Spouse name: Not on file  . Number of children: Not on file  . Years of education: Not on file  . Highest education  level: Not on file  Occupational History  . Not on file  Tobacco Use  . Smoking status: Former Smoker    Packs/day: 1.00    Years: 10.00    Pack years: 10.00    Types: Cigarettes    Quit date: 09/08/1973    Years since quitting: 46.5  . Smokeless tobacco: Never Used  Vaping Use  . Vaping Use: Never used  Substance and Sexual Activity  . Alcohol use: No  . Drug use: No  . Sexual activity: Not on file  Other Topics Concern  . Not on file  Social History Narrative  . Not on file   Social Determinants of Health   Financial Resource Strain:   . Difficulty of Paying Living Expenses:   Food Insecurity:   . Worried About Charity fundraiser in the Last Year:   . Arboriculturist in the Last Year:   Transportation Needs:   . Film/video editor (Medical):   Marland Kitchen Lack of Transportation (Non-Medical):   Physical Activity:   . Days of Exercise per Week:   . Minutes of Exercise per Session:   Stress:   . Feeling of Stress :   Social Connections:   . Frequency of Communication with Friends and Family:   . Frequency of Social Gatherings with Friends and Family:   . Attends Religious Services:   . Active Member of Clubs or Organizations:   . Attends Archivist Meetings:   Marland Kitchen Marital Status:      Family History: The patient's family history includes Colon cancer in his cousin; Diabetes in his mother; Heart disease in his father.  ROS:   Please see the history of present illness.    All other systems reviewed and are negative.  EKGs/Labs/Other Studies Reviewed:    The following studies were reviewed today: Myocardial Perfusion Scan 07/12/2019: Study Highlights   Nuclear stress EF: 51%. Visually, the EF appears to be be greater than the 51% calculated by the computer. The EF appears to be 55-60% visually.  There was no ST segment deviation noted during stress.  This is a low risk study. There is no evidence of ischemia or previous infarction.  The study is  normal.   EKG:  EKG is not ordered today.    Recent Labs: 06/20/2019: ALT 14; TSH 1.45 10/24/2019: BUN 21; Creatinine, Ser 1.37; Hemoglobin 13.8; Platelets 260.0; Potassium 4.4; Sodium 134  Recent Lipid Panel    Component Value Date/Time   CHOL 110 06/20/2019 0836   TRIG 111.0  06/20/2019 0836   HDL 46.70 06/20/2019 0836   CHOLHDL 2 06/20/2019 0836   VLDL 22.2 06/20/2019 0836   LDLCALC 41 06/20/2019 0836    Physical Exam:    VS:  BP 110/62   Pulse 63   Ht 5\' 7"  (1.702 m)   Wt 153 lb (69.4 kg)   SpO2 97%   BMI 23.96 kg/m     Wt Readings from Last 3 Encounters:  03/08/20 153 lb (69.4 kg)  10/26/19 155 lb (70.3 kg)  08/03/19 154 lb (69.9 kg)     GEN:  Well nourished, well developed in no acute distress HEENT: Normal NECK: No JVD; No carotid bruits LYMPHATICS: No lymphadenopathy CARDIAC: RRR, no murmurs, rubs, gallops RESPIRATORY:  Clear to auscultation without rales, wheezing or rhonchi  ABDOMEN: Soft, non-tender, non-distended MUSCULOSKELETAL:  No edema; No deformity  SKIN: Warm and dry NEUROLOGIC:  Alert and oriented x 3 PSYCHIATRIC:  Normal affect   ASSESSMENT:    1. Coronary artery disease involving native coronary artery of native heart without angina pectoris   2. Mixed hyperlipidemia   3. Essential hypertension   4. Stage 3a chronic kidney disease    PLAN:    In order of problems listed above:  1. Doing well. Myoview reviewed and shows normal perfusion. Medical program reviewed and includes ASA, a statin drug, a beta blocker, and an ACE-inhibitor.  2. Lipids are excellent with a total cholesterol of 110 mg/dL, HDL 47, and LDL 41 mg/dL 3. BP well-controlled on current Rx. Creatinine stable at 1.37 mg/dL, K 4.4 mg/dL. 4. Discussed importance of adequate fluid intake, use of an ACE-inhibitor, and avoidance of NSAIDs.   Medication Adjustments/Labs and Tests Ordered: Current medicines are reviewed at length with the patient today.  Concerns regarding  medicines are outlined above.  Orders Placed This Encounter  Procedures  . EKG 12-Lead   No orders of the defined types were placed in this encounter.   Patient Instructions  Medication Instructions:  Your provider recommends that you continue on your current medications as directed. Please refer to the Current Medication list given to you today.   *If you need a refill on your cardiac medications before your next appointment, please call your pharmacy*   Follow-Up: At [facility], you and your health needs are our priority.  As part of our continuing mission to provide you with exceptional heart care, we have created designated Provider Care Teams.  These Care Teams include your primary Cardiologist (physician) and Advanced Practice Providers (APPs -  Physician Assistants and Nurse Practitioners) who all work together to provide you with the care you need, when you need it. Your next appointment:   12 month(s) The format for your next appointment:   In Person Provider:   You may see Sherren Mocha, MD or one of the following Advanced Practice Providers on your designated Care Team:    Richardson Dopp, PA-C  Robbie Lis, [place]      Signed, Sherren Mocha, MD  03/08/2020 1:06 PM    [place]

## 2020-04-27 ENCOUNTER — Other Ambulatory Visit (INDEPENDENT_AMBULATORY_CARE_PROVIDER_SITE_OTHER): Payer: Medicare Other

## 2020-04-27 ENCOUNTER — Other Ambulatory Visit: Payer: Self-pay

## 2020-04-27 DIAGNOSIS — E119 Type 2 diabetes mellitus without complications: Secondary | ICD-10-CM

## 2020-04-27 DIAGNOSIS — E063 Autoimmune thyroiditis: Secondary | ICD-10-CM | POA: Diagnosis not present

## 2020-04-27 LAB — URINALYSIS, ROUTINE W REFLEX MICROSCOPIC
Bilirubin Urine: NEGATIVE
Ketones, ur: NEGATIVE
Leukocytes,Ua: NEGATIVE
Nitrite: NEGATIVE
Specific Gravity, Urine: <=1.005 — AB (ref 1.000–1.030)
Total Protein, Urine: NEGATIVE
Urine Glucose: NEGATIVE
Urobilinogen, UA: 0.2 (ref 0.0–1.0)
pH: 6 (ref 5.0–8.0)

## 2020-04-27 LAB — COMPREHENSIVE METABOLIC PANEL
ALT: 20 U/L (ref 0–53)
AST: 20 U/L (ref 0–37)
Albumin: 4.3 g/dL (ref 3.5–5.2)
Alkaline Phosphatase: 64 U/L (ref 39–117)
BUN: 19 mg/dL (ref 6–23)
CO2: 27 mEq/L (ref 19–32)
Calcium: 9.5 mg/dL (ref 8.4–10.5)
Chloride: 101 mEq/L (ref 96–112)
Creatinine, Ser: 1.55 mg/dL — ABNORMAL HIGH (ref 0.40–1.50)
GFR: 44 mL/min — ABNORMAL LOW (ref >60.00)
Glucose, Bld: 87 mg/dL (ref 70–99)
Potassium: 5.2 mEq/L — ABNORMAL HIGH (ref 3.5–5.1)
Sodium: 135 mEq/L (ref 135–145)
Total Bilirubin: 0.5 mg/dL (ref 0.2–1.2)
Total Protein: 7.1 g/dL (ref 6.0–8.3)

## 2020-04-27 LAB — TSH: TSH: 1.99 u[IU]/mL (ref 0.35–4.50)

## 2020-04-27 LAB — MICROALBUMIN / CREATININE URINE RATIO
Creatinine,U: 33.8 mg/dL
Microalb Creat Ratio: 2.1 mg/g (ref 0.0–30.0)
Microalb, Ur: <0.7 mg/dL (ref 0.0–1.9)

## 2020-04-27 LAB — T4, FREE: Free T4: 1.42 ng/dL (ref 0.60–1.60)

## 2020-04-27 LAB — HEMOGLOBIN A1C: Hgb A1c MFr Bld: 5.9 % (ref 4.6–6.5)

## 2020-04-29 NOTE — Progress Notes (Signed)
Patient ID: [name] [name], male   DOB: [birth year], 74 y.o.   MRN: [id number]    Reason for Appointment: Endocrinology follow-up  History of Present Illness   HYPOTHYROIDISM, primary: This is long-standing and has been on levothyroxine supplementation  In 12/2017 his levothyroxine was increased from 125 up to 137 mcg daily Subsequently has had normal TSH levels  He feels fairly good overall  Labs as follows: Free T4 is relatively higher but TSH is very stable  Lab Results  Component Value Date   TSH 1.99 04/27/2020   TSH 1.45 06/20/2019   TSH 1.30 01/13/2019   FREET4 1.42 04/27/2020   FREET4 1.34 06/20/2019   FREET4 1.16 03/01/2018    PROBLEM 2:   Diagnosis: Type 2 DIABETES MELITUS, date of diagnosis: 2009     Previous history: He has been treated with metformin monotherapy for several years with stable control Unable to exercise much because of chronic back pain  Recent history:   His A1c is back down to 5.9 from 6.3  Oral hypoglycemic drugs:   metformin ER 1500 mg pcs      Current management, blood sugar readings and problems identified:   His weight is about the same  He is still trying to be as active as possible although has to rest between activities, is trying to do mostly housework  He is eating small meals and mostly salads at lunch  He thinks occasionally higher sugars may be related to eating more fruit like grapes  Taking Metformin consistently as before  Fasting blood sugars are excellent with lab glucose 87      Side effects from medications: None       Monitors blood glucose:  about once a day or less .    Glucometer:  One Touch        Blood Glucose readings from download:   PRE-MEAL Fasting Lunch Dinner Bedtime Overall  Glucose range:  96      Mean/median:      109   POST-MEAL PC Breakfast PC Lunch PC Dinner  Glucose range:  91-106  96-131   Mean/median:   115    Previous readings:  PRE-MEAL  mornings Lunch Dinner  Bedtime Overall  Glucose range:  84-117      Mean/median:      112   POST-MEAL PC Breakfast PC Lunch PC Dinner  Glucose range:   106-162  108, 119  Mean/median:        Dietician visit: Most recent: 11/2008    Weight control:  Wt Readings from Last 3 Encounters:  04/30/20 155 lb 9.6 oz (70.6 kg)  03/08/20 153 lb (69.4 kg)  10/26/19 155 lb (70.3 kg)        Eye exam has been done annually   Diabetes labs:  Lab Results  Component Value Date   HGBA1C 5.9 04/27/2020   HGBA1C 6.3 10/24/2019   HGBA1C 5.5 06/20/2019   Lab Results  Component Value Date   MICROALBUR <0.7 04/27/2020   LDLCALC 41 06/20/2019   CREATININE 1.55 (H) 04/27/2020      Allergies as of 04/30/2020      Reactions   Sulfamethoxazole-trimethoprim Other (See Comments)   "Extreme discomfort"   Sulfa Antibiotics Rash   Niaspan [niacin Er] Hives      Medication List       Accurate as of April 30, 2020  8:17 AM. If you have any questions, ask your nurse or doctor.  amLODipine 5 MG tablet Commonly known as: NORVASC TAKE 1 TABLET BY MOUTH  DAILY   aspirin EC 81 MG tablet Take 81 mg by mouth daily.   atorvastatin 20 MG tablet Commonly known as: LIPITOR Take 1 tablet (20 mg total) by mouth daily at 6 PM.   fexofenadine 180 MG tablet Commonly known as: ALLEGRA Take 180 mg by mouth daily.   folic acid 914 MCG tablet Commonly known as: FOLVITE Take 800 mcg by mouth daily.   gabapentin 300 MG capsule Commonly known as: NEURONTIN Take 300 mg by mouth as directed.   levothyroxine 137 MCG tablet Commonly known as: SYNTHROID TAKE 1 TABLET BY MOUTH  DAILY BEFORE BREAKFAST   metFORMIN 500 MG 24 hr tablet Commonly known as: GLUCOPHAGE-XR TAKE 3 TABLETS BY MOUTH AT  BEDTIME   metoprolol succinate 25 MG 24 hr tablet Commonly known as: Toprol XL Take 1 tablet (25 mg total) by mouth daily.   multivitamin per tablet Take 1 tablet by mouth daily.   NexIUM 24HR 20 MG capsule Generic drug:  esomeprazole Take 20 mg by mouth daily at 12 noon.   niacin 500 MG tablet Take 1 tablet (500 mg total) by mouth at bedtime.   nitroGLYCERIN 0.4 MG SL tablet Commonly known as: NITROSTAT Place 1 tablet (0.4 mg total) under the tongue every 5 (five) minutes as needed for chest pain.   OneTouch Verio test strip Generic drug: glucose blood USE AS DIRECTED TO CHECK  BLOOD SUGAR ONCE DAILY   ramipril 10 MG capsule Commonly known as: ALTACE TAKE 1 CAPSULE BY MOUTH  DAILY   Slow Fe 142 (45 Fe) MG Tbcr Generic drug: Ferrous Sulfate Take 1 tablet by mouth daily.   traMADol 50 MG tablet Commonly known as: ULTRAM Take 50 mg by mouth every 6 (six) hours as needed for severe pain (leg pain).       Allergies:  Allergies  Allergen Reactions  . Sulfamethoxazole-Trimethoprim Other (See Comments)    "Extreme discomfort"  . Sulfa Antibiotics Rash  . Niaspan [Niacin Er] Hives    Past Medical History:  Diagnosis Date  . Allergy   . AMI (acute mesenteric ischemia) ([place])   . Arthritis, lumbar spine   . Cataract   . Colon polyps   . Coronary artery disease 01/24/2011   S/p CABG // Myoview 07/2019: EF 51 (visually appears 55-60), no ischemia or infarction, Low Risk  . Diabetes mellitus without complication ([place])   . Diverticulosis   . GERD (gastroesophageal reflux disease)   . Heart attack ([place]) 2004  . Hemorrhoids   . HTN (hypertension)   . Hypothyroidism   . Inguinal hernia   . Lumbar scoliosis   . Rectal bleeding     Past Surgical History:  Procedure Laterality Date  . APPENDECTOMY  1966  . by-pass    . CATARACT EXTRACTION     Bil  /   11/2012  . COLONOSCOPY    . CORONARY ANGIOPLASTY WITH STENT PLACEMENT    . CORONARY ARTERY BYPASS GRAFT  2004   x 5 / one stent  . FEMUR IM NAIL Left 05/25/2014   Procedure: INTRAMEDULLARY (IM) NAIL FEMORAL WITH CABLES;  Surgeon: Meredith Pel, MD;  Location: [facility];  Service: Orthopedics;  Laterality: Left;  . INGUINAL HERNIA REPAIR   1994   right side  . lumbar laminotomy  10/2009  . POLYPECTOMY      Family History  Problem Relation Age of Onset  . Diabetes Mother   .  Heart disease Father   . Colon cancer Cousin     Social History:  reports that he quit smoking about 46 years ago. His smoking use included cigarettes. He has a 10.00 pack-year smoking history. He has never used smokeless tobacco. He reports that he does not drink alcohol and does not use drugs.  Review of Systems:  Lipids: LDL has been at target of under 70 on a regimen of Lipitor 20 mg daily prescribed by his cardiologist.   Also has been taking niacin for low normal HDL    Lab Results  Component Value Date   CHOL 110 06/20/2019   HDL 46.70 06/20/2019   LDLCALC 41 06/20/2019   TRIG 111.0 06/20/2019   CHOLHDL 2 06/20/2019     Hypertension: On ramipril 10 mg from cardiologist, home blood pressure range 110-120/60s   BP Readings from Last 3 Encounters:  04/30/20 110/64  03/08/20 110/62  10/26/19 128/72    Renal dysfunction: Appears to be somewhat worse again, he is on 10 mg ramipril Also potassium is higher than usual  Lab Results  Component Value Date   CREATININE 1.55 (H) 04/27/2020   CREATININE 1.37 10/24/2019   CREATININE 1.47 06/20/2019   Lab Results  Component Value Date   K 5.2 No hemolysis seen (H) 04/27/2020   K 4.4 10/24/2019   K 5.0 06/20/2019     Foot exam done in 2/21, has normal sensory exam  Eye exam 03/2019, negative    Examination:   BP 110/64 (BP Location: Left Arm, Patient Position: Sitting, Cuff Size: Normal)   Pulse 78   Ht 5\' 7"  (1.702 m)   Wt 155 lb 9.6 oz (70.6 kg)   BMI 24.37 kg/m   Body mass index is 24.37 kg/m.   No ankle edema  ASSESSMENT/ PLAN:    Diabetes type 2, nonobese:  A1c is excellent at 5.9  He has been taking extended release metformin 1500 mg daily for quite some time No change in his blood sugars at home or weight However did not check enough readings after  dinner He will temporarily reduce his Metformin to 1000 mg since his creatinine is relatively higher  Microalbumin is normal  Hypothyroidism, primary and long-standing:   He is taking stable dose of 137 mcg levothyroxine His recent TSH was normal Subjectively doing well and he will continue the same dose    Lipids: Well-controlled as of 10/20, currently prescribed Lipitor by cardiologist  Renal dysfunction: His creatinine has gone up significantly along with mild increase in potassium Likely needs to be switched to 25 or 50 mg losartan based on his blood pressure being lower We will forward labs to his other physicians for action  Also needs follow-up with Dr. Burt Knack for repeat labs   Follow-up in 6 months  Dequan Kindred 04/30/2020, 8:17 AM

## 2020-04-30 ENCOUNTER — Telehealth: Payer: Self-pay

## 2020-04-30 ENCOUNTER — Ambulatory Visit: Payer: Medicare Other | Admitting: Endocrinology

## 2020-04-30 ENCOUNTER — Encounter: Payer: Self-pay | Admitting: Endocrinology

## 2020-04-30 ENCOUNTER — Other Ambulatory Visit: Payer: Self-pay

## 2020-04-30 VITALS — BP 110/64 | HR 78 | Ht 67.0 in | Wt 155.6 lb

## 2020-04-30 DIAGNOSIS — E782 Mixed hyperlipidemia: Secondary | ICD-10-CM

## 2020-04-30 DIAGNOSIS — E119 Type 2 diabetes mellitus without complications: Secondary | ICD-10-CM | POA: Diagnosis not present

## 2020-04-30 DIAGNOSIS — E063 Autoimmune thyroiditis: Secondary | ICD-10-CM

## 2020-04-30 DIAGNOSIS — E875 Hyperkalemia: Secondary | ICD-10-CM

## 2020-04-30 MED ORDER — LOSARTAN POTASSIUM 50 MG PO TABS: 50.0000 mg | ORAL_TABLET | Freq: Every day | ORAL | 0 refills | Status: DC

## 2020-04-30 MED ORDER — LOSARTAN POTASSIUM 50 MG PO TABS: 50.0000 mg | ORAL_TABLET | Freq: Every day | ORAL | 3 refills | Status: DC

## 2020-04-30 NOTE — Patient Instructions (Addendum)
Metformin 2 daily  Check blood sugars on waking up 2 days a week  Also check blood sugars about 2 hours after meals and do this after different meals by rotation  Recommended blood sugar levels on waking up are 90-120 and about 2 hours after meal is 130-160  Please bring your blood sugar monitor to each visit, thank you  Check with Dr Burt Knack re: Ramipril

## 2020-04-30 NOTE — Telephone Encounter (Addendum)
Editor: Sherren Mocha, MD (Physician)     I would be fine to change him to losartan to see if this helps. Will ask my nurse Valetta Fuller to reach out to him. thanks        Spoke to the patient's wife (DPR). Instructed her to have the patient STOP RAMIPRIL and START LOSARTAN 50 mg daily. He will monitor BP and call if consistently out of normal range or symptoms occur. She was grateful for assistance.

## 2020-04-30 NOTE — Telephone Encounter (Signed)
-----   Message from Sherren Mocha, MD sent at 04/30/2020 11:02 AM EDT -----   ----- Message ----- From: Elayne Snare, MD Sent: 04/30/2020   8:35 AM EDT To: Sherren Mocha, MD  Renal function is impaired with high potassium and low normal blood pressure. Ramipril 10 mg prescribed by Dr. Burt Knack. May do better with losartan 50 mg, will defer to Dr. Burt Knack

## 2020-04-30 NOTE — Progress Notes (Signed)
I would be fine to change him to losartan to see if this helps. Will ask my nurse Valetta Fuller to reach out to him. thanks

## 2020-05-21 MED ORDER — LOSARTAN POTASSIUM 50 MG PO TABS: 50.0000 mg | ORAL_TABLET | Freq: Every day | ORAL | 3 refills | Status: DC

## 2020-05-22 ENCOUNTER — Other Ambulatory Visit: Payer: Self-pay | Admitting: Cardiovascular Disease

## 2020-07-06 LAB — HM DIABETES EYE EXAM

## 2020-07-09 ENCOUNTER — Encounter: Payer: Self-pay | Admitting: *Deleted

## 2020-07-23 ENCOUNTER — Other Ambulatory Visit: Payer: Self-pay | Admitting: Endocrinology

## 2020-07-23 ENCOUNTER — Other Ambulatory Visit: Payer: Self-pay | Admitting: Cardiovascular Disease

## 2020-08-29 ENCOUNTER — Other Ambulatory Visit: Payer: Self-pay | Admitting: Cardiovascular Disease

## 2020-09-20 DIAGNOSIS — K219 Gastro-esophageal reflux disease without esophagitis: Secondary | ICD-10-CM | POA: Diagnosis not present

## 2020-09-20 DIAGNOSIS — K625 Hemorrhage of anus and rectum: Secondary | ICD-10-CM | POA: Diagnosis not present

## 2020-09-20 LAB — BASIC METABOLIC PANEL: Glucose: 84

## 2020-10-04 DIAGNOSIS — R899 Unspecified abnormal finding in specimens from other organs, systems and tissues: Secondary | ICD-10-CM | POA: Diagnosis not present

## 2020-10-04 LAB — BASIC METABOLIC PANEL: Glucose: 84

## 2020-10-18 ENCOUNTER — Encounter: Payer: Self-pay | Admitting: Endocrinology

## 2020-10-18 ENCOUNTER — Encounter: Payer: Self-pay | Admitting: *Deleted

## 2020-10-30 ENCOUNTER — Other Ambulatory Visit: Payer: Medicare Other

## 2020-11-01 ENCOUNTER — Ambulatory Visit (INDEPENDENT_AMBULATORY_CARE_PROVIDER_SITE_OTHER): Payer: Medicare Other | Admitting: Endocrinology

## 2020-11-01 ENCOUNTER — Encounter: Payer: Self-pay | Admitting: Endocrinology

## 2020-11-01 ENCOUNTER — Other Ambulatory Visit: Payer: Self-pay

## 2020-11-01 VITALS — BP 122/84 | HR 67 | Ht 67.0 in | Wt 155.0 lb

## 2020-11-01 DIAGNOSIS — N289 Disorder of kidney and ureter, unspecified: Secondary | ICD-10-CM | POA: Diagnosis not present

## 2020-11-01 DIAGNOSIS — E119 Type 2 diabetes mellitus without complications: Secondary | ICD-10-CM

## 2020-11-01 DIAGNOSIS — E782 Mixed hyperlipidemia: Secondary | ICD-10-CM | POA: Diagnosis not present

## 2020-11-01 DIAGNOSIS — E063 Autoimmune thyroiditis: Secondary | ICD-10-CM

## 2020-11-01 LAB — COMPREHENSIVE METABOLIC PANEL
ALT: 20 U/L (ref 0–53)
AST: 20 U/L (ref 0–37)
Albumin: 4.5 g/dL (ref 3.5–5.2)
Alkaline Phosphatase: 71 U/L (ref 39–117)
BUN: 16 mg/dL (ref 6–23)
CO2: 29 mEq/L (ref 19–32)
Calcium: 9.9 mg/dL (ref 8.4–10.5)
Chloride: 101 mEq/L (ref 96–112)
Creatinine, Ser: 1.46 mg/dL (ref 0.40–1.50)
GFR: 46.94 mL/min — ABNORMAL LOW (ref >60.00)
Glucose, Bld: 95 mg/dL (ref 70–99)
Potassium: 4.9 mEq/L (ref 3.5–5.1)
Sodium: 137 mEq/L (ref 135–145)
Total Bilirubin: 0.5 mg/dL (ref 0.2–1.2)
Total Protein: 7.8 g/dL (ref 6.0–8.3)

## 2020-11-01 LAB — POCT GLYCOSYLATED HEMOGLOBIN (HGB A1C): Hemoglobin A1C: 5.6 % (ref 4.0–5.6)

## 2020-11-01 LAB — LIPID PANEL
Cholesterol: 113 mg/dL (ref 0–200)
HDL: 45.9 mg/dL (ref >39.00)
LDL Cholesterol: 36 mg/dL (ref 0–99)
NonHDL: 67.29
Total CHOL/HDL Ratio: 2
Triglycerides: 154 mg/dL — ABNORMAL HIGH (ref 0.0–149.0)
VLDL: 30.8 mg/dL (ref 0.0–40.0)

## 2020-11-01 LAB — TSH: TSH: 1.88 u[IU]/mL (ref 0.35–4.50)

## 2020-11-01 LAB — T4, FREE: Free T4: 1.31 ng/dL (ref 0.60–1.60)

## 2020-11-01 NOTE — Progress Notes (Signed)
Patient ID: [name] [name], male   DOB: May 23, 1946, 75 y.o.   MRN: [id number]    Reason for Appointment: Endocrinology follow-up  History of Present Illness   HYPOTHYROIDISM, primary: This is long-standing and has been on levothyroxine supplementation  In 12/2017 his levothyroxine was increased from 125 up to 137 mcg daily Subsequently has had normal TSH levels  No unusual fatigue recently  Labs pending  Lab Results  Component Value Date   TSH 1.99 04/27/2020   TSH 1.45 06/20/2019   TSH 1.30 01/13/2019   FREET4 1.42 04/27/2020   FREET4 1.34 06/20/2019   FREET4 1.16 03/01/2018    PROBLEM 2:   Diagnosis: Type 2 DIABETES MELITUS, date of diagnosis: 2009     Previous history: He has been treated with metformin monotherapy for several years with stable control Unable to exercise much because of chronic back pain  Recent history:   His A1c is lower than expected at 5.6  Oral hypoglycemic drugs:   metformin ER 1000 mg pcs      Current management, blood sugar readings and problems identified:   His Metformin was reduced to in 8/21 because of renal dysfunction  However his blood sugars are still excellent and recent median reading 108 at home  Most of his blood sugars are in the afternoon and if you have in the morning  He did not able to do any physical activity because of back pain  Weight is about the same  Again trying to be careful with meal planning and eating healthy      Side effects from medications: None       Monitors blood glucose:  about once a day or less .    Glucometer:  One Touch        Blood Glucose readings from download:  RANGE 85-129 with AVERAGE 107  Previous readings:  PRE-MEAL Fasting Lunch Dinner Bedtime Overall  Glucose range:  96      Mean/median:      109   POST-MEAL PC Breakfast PC Lunch PC Dinner  Glucose range:  91-106  96-131   Mean/median:   115     Dietician visit: Most recent: 11/2008    Weight  control:  Wt Readings from Last 3 Encounters:  11/01/20 155 lb (70.3 kg)  04/30/20 155 lb 9.6 oz (70.6 kg)  03/08/20 153 lb (69.4 kg)        Eye exam has been done annually   Diabetes labs:  Lab Results  Component Value Date   HGBA1C 5.6 11/01/2020   HGBA1C 5.9 04/27/2020   HGBA1C 6.3 10/24/2019   Lab Results  Component Value Date   MICROALBUR <0.7 04/27/2020   [place] 41 06/20/2019   CREATININE 1.55 (H) 04/27/2020      Allergies as of 11/01/2020      Reactions   Sulfamethoxazole-trimethoprim Other (See Comments)   "Extreme discomfort"   Sulfa Antibiotics Rash   Niaspan [niacin Er] Hives      Medication List       Accurate as of November 01, 2020 11:27 AM. If you have any questions, ask your nurse or doctor.        amLODipine 5 MG tablet Commonly known as: NORVASC TAKE 1 TABLET BY MOUTH  DAILY   aspirin EC 81 MG tablet Take 81 mg by mouth daily.   atorvastatin 20 MG tablet Commonly known as: LIPITOR TAKE 1 TABLET BY MOUTH  DAILY AT 6 PM  esomeprazole 20 MG capsule Commonly known as: NEXIUM Take 20 mg by mouth daily at 12 noon.   fexofenadine 180 MG tablet Commonly known as: ALLEGRA Take 180 mg by mouth daily.   folic acid 324 MCG tablet Commonly known as: FOLVITE Take 800 mcg by mouth daily.   gabapentin 300 MG capsule Commonly known as: NEURONTIN Take 300 mg by mouth as directed.   levothyroxine 137 MCG tablet Commonly known as: SYNTHROID TAKE 1 TABLET BY MOUTH  DAILY BEFORE BREAKFAST   losartan 50 MG tablet Commonly known as: COZAAR TAKE 1 TABLET BY MOUTH EVERY DAY   metFORMIN 500 MG 24 hr tablet Commonly known as: GLUCOPHAGE-XR TAKE 3 TABLETS BY MOUTH AT  BEDTIME   metoprolol succinate 25 MG 24 hr tablet Commonly known as: Toprol XL Take 1 tablet (25 mg total) by mouth daily.   multivitamin per tablet Take 1 tablet by mouth daily.   niacin 500 MG tablet Take 1 tablet (500 mg total) by mouth at bedtime.   nitroGLYCERIN 0.4  MG SL tablet Commonly known as: NITROSTAT Place 1 tablet (0.4 mg total) under the tongue every 5 (five) minutes as needed for chest pain.   OneTouch Verio test strip Generic drug: glucose blood USE AS DIRECTED TO CHECK  BLOOD SUGAR ONCE DAILY   Slow Fe 142 (45 Fe) MG Tbcr Generic drug: Ferrous Sulfate Take 1 tablet by mouth daily.   traMADol 50 MG tablet Commonly known as: ULTRAM Take 50 mg by mouth every 6 (six) hours as needed for severe pain (leg pain).       Allergies:  Allergies  Allergen Reactions  . Sulfamethoxazole-Trimethoprim Other (See Comments)    "Extreme discomfort"  . Sulfa Antibiotics Rash  . Niaspan [Niacin Er] Hives    Past Medical History:  Diagnosis Date  . Allergy   . AMI (acute mesenteric ischemia)   . Arthritis, lumbar spine   . Cataract   . Colon polyps   . Coronary artery disease 01/24/2011   S/p CABG // Myoview 07/2019: EF 51 (visually appears 55-60), no ischemia or infarction, Low Risk  . Diabetes mellitus without complication ([place])   . Diverticulosis   . GERD (gastroesophageal reflux disease)   . Heart attack ([place]) 2004  . Hemorrhoids   . HTN (hypertension)   . Hypothyroidism   . Inguinal hernia   . Lumbar scoliosis   . Rectal bleeding     Past Surgical History:  Procedure Laterality Date  . APPENDECTOMY  1966  . by-pass    . CATARACT EXTRACTION     Bil  /   11/2012  . COLONOSCOPY    . CORONARY ANGIOPLASTY WITH STENT PLACEMENT    . CORONARY ARTERY BYPASS GRAFT  2004   x 5 / one stent  . FEMUR IM NAIL Left 05/25/2014   Procedure: INTRAMEDULLARY (IM) NAIL FEMORAL WITH CABLES;  Surgeon: Meredith Pel, MD;  Location: [facility];  Service: Orthopedics;  Laterality: Left;  . INGUINAL HERNIA REPAIR  1994   right side  . lumbar laminotomy  10/2009  . POLYPECTOMY      Family History  Problem Relation Age of Onset  . Diabetes Mother   . Heart disease Father   . Colon cancer Cousin     Social History:  reports that he quit  smoking about 47 years ago. His smoking use included cigarettes. He has a 10.00 pack-year smoking history. He has never used smokeless tobacco. He reports that he does not drink  alcohol and does not use drugs.  Review of Systems:  Lipids: LDL has been previously at target of under 70 on a regimen of Lipitor 20 mg daily prescribed by his cardiologist.   Also has been taking niacin for low normal HDL  No recent labs available  Lab Results  Component Value Date   CHOL 110 06/20/2019   HDL 46.70 06/20/2019   LDLCALC 41 06/20/2019   TRIG 111.0 06/20/2019   CHOLHDL 2 06/20/2019     Hypertension: On losartan 50 mg from cardiologist, home blood pressure range 112-130/60s Previously had been on ramipril  BP Readings from Last 3 Encounters:  11/01/20 122/84  04/30/20 110/64  03/08/20 110/62    Renal dysfunction: Etiology unclear Last creatinine was 1.47 at PCP office with normal potassium  Lab Results  Component Value Date   CREATININE 1.55 (H) 04/27/2020   CREATININE 1.37 10/24/2019   CREATININE 1.47 06/20/2019   Lab Results  Component Value Date   K 5.2 No hemolysis seen (H) 04/27/2020   K 4.4 10/24/2019   K 5.0 06/20/2019     Foot exam done in 2/21, has normal sensory exam  Eye exam 03/2019, negative    Examination:   BP 122/84 (BP Location: Left Arm, Patient Position: Sitting, Cuff Size: Normal)   Pulse 67   Ht 5\' 7"  (1.702 m)   Wt 155 lb (70.3 kg)   SpO2 98%   BMI 24.28 kg/m   Body mass index is 24.28 kg/m.     ASSESSMENT/ PLAN:    Diabetes type 2, nonobese:  A1c is excellent at 5.6  He has been taking extended release metformin 1000 mg daily with consistent control His blood sugars are excellent and highest reading 129 after lunch Not doing many readings after dinner but fasting readings are near normal also Has been able to maintain his weight down and eat healthy meals  We will continue the same regimen of 1000 mg Metformin  daily  Hypothyroidism, primary and long-standing:   He is taking stable dose of 137 mcg levothyroxine Labs to be checked at Discussed timing of taking levothyroxine on empty stomach before breakfast   Lipids: Needs follow-up lipid panel  Renal dysfunction: Not related to diabetes He is still on an ARB drug and not clear if this is playing a role We will repeat his labs at patient request   Follow-up in 6 months  [name] [name] 11/01/2020, 11:27 AM

## 2020-11-04 NOTE — Progress Notes (Signed)
Please call to let patient know that the lab results are good and no change in medications needed

## 2020-11-09 DIAGNOSIS — M419 Scoliosis, unspecified: Secondary | ICD-10-CM | POA: Diagnosis not present

## 2020-11-09 DIAGNOSIS — M545 Low back pain, unspecified: Secondary | ICD-10-CM | POA: Diagnosis not present

## 2020-11-26 ENCOUNTER — Other Ambulatory Visit: Payer: Self-pay | Admitting: Endocrinology

## 2020-11-27 DIAGNOSIS — Z01812 Encounter for preprocedural laboratory examination: Secondary | ICD-10-CM | POA: Diagnosis not present

## 2020-11-30 DIAGNOSIS — K625 Hemorrhage of anus and rectum: Secondary | ICD-10-CM | POA: Diagnosis not present

## 2020-11-30 DIAGNOSIS — R12 Heartburn: Secondary | ICD-10-CM | POA: Diagnosis not present

## 2020-11-30 DIAGNOSIS — K621 Rectal polyp: Secondary | ICD-10-CM | POA: Diagnosis not present

## 2020-11-30 DIAGNOSIS — K449 Diaphragmatic hernia without obstruction or gangrene: Secondary | ICD-10-CM | POA: Diagnosis not present

## 2020-11-30 DIAGNOSIS — K635 Polyp of colon: Secondary | ICD-10-CM | POA: Diagnosis not present

## 2020-11-30 DIAGNOSIS — D120 Benign neoplasm of cecum: Secondary | ICD-10-CM | POA: Diagnosis not present

## 2020-11-30 DIAGNOSIS — K648 Other hemorrhoids: Secondary | ICD-10-CM | POA: Diagnosis not present

## 2020-11-30 DIAGNOSIS — K2281 Esophageal polyp: Secondary | ICD-10-CM | POA: Diagnosis not present

## 2020-11-30 DIAGNOSIS — K2289 Other specified disease of esophagus: Secondary | ICD-10-CM | POA: Diagnosis not present

## 2020-11-30 DIAGNOSIS — K5730 Diverticulosis of large intestine without perforation or abscess without bleeding: Secondary | ICD-10-CM | POA: Diagnosis not present

## 2020-11-30 DIAGNOSIS — K644 Residual hemorrhoidal skin tags: Secondary | ICD-10-CM | POA: Diagnosis not present

## 2020-12-05 DIAGNOSIS — K621 Rectal polyp: Secondary | ICD-10-CM | POA: Diagnosis not present

## 2020-12-05 DIAGNOSIS — D120 Benign neoplasm of cecum: Secondary | ICD-10-CM | POA: Diagnosis not present

## 2020-12-05 DIAGNOSIS — K635 Polyp of colon: Secondary | ICD-10-CM | POA: Diagnosis not present

## 2020-12-11 ENCOUNTER — Other Ambulatory Visit: Payer: Self-pay | Admitting: *Deleted

## 2020-12-11 DIAGNOSIS — E119 Type 2 diabetes mellitus without complications: Secondary | ICD-10-CM

## 2020-12-11 MED ORDER — LEVOTHYROXINE SODIUM 137 MCG PO TABS: 137.0000 ug | ORAL_TABLET | Freq: Every day | ORAL | 3 refills | Status: DC

## 2020-12-13 ENCOUNTER — Other Ambulatory Visit: Payer: Self-pay | Admitting: Cardiovascular Disease

## 2020-12-13 DIAGNOSIS — I10 Essential (primary) hypertension: Secondary | ICD-10-CM

## 2020-12-20 DIAGNOSIS — L570 Actinic keratosis: Secondary | ICD-10-CM | POA: Diagnosis not present

## 2020-12-20 DIAGNOSIS — D1801 Hemangioma of skin and subcutaneous tissue: Secondary | ICD-10-CM | POA: Diagnosis not present

## 2020-12-20 DIAGNOSIS — L720 Epidermal cyst: Secondary | ICD-10-CM | POA: Diagnosis not present

## 2020-12-20 DIAGNOSIS — L821 Other seborrheic keratosis: Secondary | ICD-10-CM | POA: Diagnosis not present

## 2020-12-20 DIAGNOSIS — L814 Other melanin hyperpigmentation: Secondary | ICD-10-CM | POA: Diagnosis not present

## 2021-01-09 ENCOUNTER — Other Ambulatory Visit: Payer: Self-pay | Admitting: Physician Assistant

## 2021-01-15 DIAGNOSIS — M5126 Other intervertebral disc displacement, lumbar region: Secondary | ICD-10-CM | POA: Diagnosis not present

## 2021-01-15 DIAGNOSIS — M47817 Spondylosis without myelopathy or radiculopathy, lumbosacral region: Secondary | ICD-10-CM | POA: Diagnosis not present

## 2021-01-15 DIAGNOSIS — M4726 Other spondylosis with radiculopathy, lumbar region: Secondary | ICD-10-CM | POA: Diagnosis not present

## 2021-01-15 DIAGNOSIS — M549 Dorsalgia, unspecified: Secondary | ICD-10-CM | POA: Diagnosis not present

## 2021-01-18 DIAGNOSIS — Z0000 Encounter for general adult medical examination without abnormal findings: Secondary | ICD-10-CM | POA: Diagnosis not present

## 2021-02-07 ENCOUNTER — Other Ambulatory Visit: Payer: Self-pay

## 2021-02-07 DIAGNOSIS — I10 Essential (primary) hypertension: Secondary | ICD-10-CM

## 2021-02-07 MED ORDER — LOSARTAN POTASSIUM 50 MG PO TABS: 1.0000 | ORAL_TABLET | Freq: Every day | ORAL | 0 refills | Status: DC

## 2021-03-05 ENCOUNTER — Other Ambulatory Visit: Payer: Self-pay | Admitting: Cardiovascular Disease

## 2021-04-01 ENCOUNTER — Other Ambulatory Visit: Payer: Self-pay | Admitting: Cardiovascular Disease

## 2021-04-02 ENCOUNTER — Other Ambulatory Visit: Payer: Self-pay | Admitting: Endocrinology

## 2021-04-11 DIAGNOSIS — M25562 Pain in left knee: Secondary | ICD-10-CM | POA: Diagnosis not present

## 2021-05-02 ENCOUNTER — Ambulatory Visit: Payer: Self-pay

## 2021-05-02 ENCOUNTER — Ambulatory Visit: Payer: Medicare Other | Admitting: Orthopedic Surgery

## 2021-05-02 ENCOUNTER — Other Ambulatory Visit: Payer: Self-pay

## 2021-05-02 DIAGNOSIS — M79605 Pain in left leg: Secondary | ICD-10-CM

## 2021-05-02 NOTE — Progress Notes (Signed)
Subjective: Patient is here for ultrasound-guided intra-articular left hip injection.   Pain is groin area with radiation down to the knee for the past month.  Pain is merrily with weightbearing, goes away at rest.  Objective: He has pain with internal rotation.  Procedure: Ultrasound guided injection is preferred based studies that show increased duration, increased effect, greater accuracy, decreased procedural pain, increased response rate, and decreased cost with ultrasound guided versus blind injection.   Verbal informed consent obtained.  Time-out conducted.  Noted no overlying erythema, induration, or other signs of local infection. Ultrasound-guided left hip injection: After sterile prep with Betadine, injected 5 cc 1% lidocaine without epinephrine and 40 mg Depo-Medrol using a 22-gauge spinal needle, passing the needle through the iliofemoral ligament into the femoral head/neck junction.  Injectate was seen filling the joint capsule.

## 2021-05-03 ENCOUNTER — Encounter: Payer: Self-pay | Admitting: Orthopedic Surgery

## 2021-05-03 NOTE — Progress Notes (Signed)
Office Visit Note   Patient: [name] [name]           Date of Birth: [birth year]           MRN: [id number] Visit Date: 05/02/2021 Requested by: [name] [name], [address] [place],  [place] [address] PCP: [name] Dust, L.[name] Sa, MD  Subjective: Chief Complaint  Patient presents with   Left Leg - Pain    Patient had prior injury-Dr [name] [name] did surgery-left femur hardware    HPI: [name] [name] is a 75 year old patient with left leg pain.  He states "I have nerve pain" pain comes and goes.  He fell off a barn several years ago and had a fractured femur.  Surgery was performed for subsequent femur fracture.  Lying flat helps his pain.  He has a history of low back pain for 20 years with 2 surgeries.  Voltaren gel helps.              ROS: All systems reviewed are negative as they relate to the chief complaint within the history of present illness.  Patient denies  fevers or chills.   Assessment & Plan: Visit Diagnoses:  1. Pain in left leg     Plan: Impression is left knee and leg pain.  Knees do not look too bad in terms of radiographic arthritis.  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  Would like to get a left hip injection done today for diagnostic and therapeutic purposes.  Follow-up in 6 weeks so we can reassess and determine whether or not any further treatment is indicated.  I think hip replacement would be a very tall order in this case requiring hardware removal.  Possible this could also be coming from his back.  Follow-Up Instructions: No follow-ups on file.   Orders:  Orders Placed This Encounter  Procedures   XR FEMUR MIN 2 VIEWS LEFT   US Guided Needle Placement - No Linked Charges   No orders of the defined types were placed in this encounter.     Procedures: No procedures performed   Clinical Data: No additional findings.  Objective: Vital Signs: There were no vitals taken for this visit.  Physical Exam:   Constitutional: Patient appears well-developed HEENT:   Head: Normocephalic Eyes:EOM are normal Neck: Normal range of motion Cardiovascular: Normal rate Pulmonary/chest: Effort normal Neurologic: Patient is alert Skin: Skin is warm Psychiatric: Patient has normal mood and affect   Ortho Exam: Ortho exam demonstrates mildly tender and diminished range of motion of the left hip compared to the right.  No nerve root tension signs.  Back incision well-healed.  Some scoliosis is present.  No trochanteric tenderness is present.  Hip flexion strength 5+ out of 5 bilaterally.  Pedal pulses palpable.  Slight Trendelenburg gait is present.  Specialty Comments:  No specialty comments available.  Imaging: No results found.   PMFS History: Patient Active Problem List   Diagnosis Date Noted   Femur fracture ([place]) 05/25/2014   Diabetes ([place]) 07/28/2013   CAD (coronary artery disease) 01/24/2011   Hyperlipemia 01/24/2011   COLONIC POLYPS 12/23/2007   HYPOTHYROIDISM 12/23/2007   HYPERTENSION 12/23/2007   AMI 12/23/2007   HEMORRHOIDS 12/23/2007   GERD 12/23/2007   DIVERTICULOSIS, COLON 12/23/2007   RECTAL BLEEDING 12/23/2007   INGUINAL HERNIA, HX OF 12/23/2007   Past Medical History:  Diagnosis Date   Allergy    AMI (acute mesenteric ischemia)    Arthritis, lumbar spine    Cataract  Colon polyps    Coronary artery disease 01/24/2011   S/p CABG // Myoview 07/2019: EF 51 (visually appears 55-60), no ischemia or infarction, Low Risk   Diabetes mellitus without complication ([facility])    Diverticulosis    GERD (gastroesophageal reflux disease)    Heart attack ([place]) 2004   Hemorrhoids    HTN (hypertension)    Hypothyroidism    Inguinal hernia    Lumbar scoliosis    Rectal bleeding     Family History  Problem Relation Age of Onset   Diabetes Mother    Heart disease Father    Colon cancer Cousin     Past Surgical History:  Procedure Laterality Date   APPENDECTOMY  1966   by-pass     CATARACT EXTRACTION     Bil  /   11/2012    COLONOSCOPY     CORONARY ANGIOPLASTY WITH STENT PLACEMENT     CORONARY ARTERY BYPASS GRAFT  2004   x 5 / one stent   FEMUR IM NAIL Left 05/25/2014   Procedure: INTRAMEDULLARY (IM) NAIL FEMORAL WITH CABLES;  Surgeon: [name] Pel, MD;  Location: [facility];  Service: Orthopedics;  Laterality: Left;   INGUINAL HERNIA REPAIR  1994   right side   lumbar laminotomy  10/2009   POLYPECTOMY     Social History   Occupational History   Not on file  Tobacco Use   Smoking status: Former    Packs/day: 1.00    Years: 10.00    Pack years: 10.00    Types: Cigarettes    Quit date: 09/08/1973    Years since quitting: 47.6   Smokeless tobacco: Never  Vaping Use   Vaping Use: Never used  Substance and Sexual Activity   Alcohol use: No   Drug use: No   Sexual activity: Not on file

## 2021-05-06 ENCOUNTER — Other Ambulatory Visit: Payer: Self-pay | Admitting: Cardiovascular Disease

## 2021-05-06 DIAGNOSIS — I10 Essential (primary) hypertension: Secondary | ICD-10-CM

## 2021-05-07 ENCOUNTER — Other Ambulatory Visit: Payer: Self-pay

## 2021-05-07 ENCOUNTER — Encounter: Payer: Self-pay | Admitting: Endocrinology

## 2021-05-07 ENCOUNTER — Encounter: Payer: Self-pay | Admitting: Orthopedic Surgery

## 2021-05-07 ENCOUNTER — Ambulatory Visit (INDEPENDENT_AMBULATORY_CARE_PROVIDER_SITE_OTHER): Payer: Medicare Other | Admitting: Endocrinology

## 2021-05-07 VITALS — BP 110/70 | HR 61 | Ht 67.0 in | Wt 153.0 lb

## 2021-05-07 DIAGNOSIS — E063 Autoimmune thyroiditis: Secondary | ICD-10-CM | POA: Diagnosis not present

## 2021-05-07 DIAGNOSIS — N1831 Chronic kidney disease, stage 3a: Secondary | ICD-10-CM

## 2021-05-07 DIAGNOSIS — E119 Type 2 diabetes mellitus without complications: Secondary | ICD-10-CM

## 2021-05-07 DIAGNOSIS — E782 Mixed hyperlipidemia: Secondary | ICD-10-CM | POA: Diagnosis not present

## 2021-05-07 LAB — COMPREHENSIVE METABOLIC PANEL
ALT: 21 U/L (ref 0–53)
AST: 19 U/L (ref 0–37)
Albumin: 4.4 g/dL (ref 3.5–5.2)
Alkaline Phosphatase: 74 U/L (ref 39–117)
BUN: 19 mg/dL (ref 6–23)
CO2: 27 mEq/L (ref 19–32)
Calcium: 9.6 mg/dL (ref 8.4–10.5)
Chloride: 101 mEq/L (ref 96–112)
Creatinine, Ser: 1.45 mg/dL (ref 0.40–1.50)
GFR: 47.16 mL/min — ABNORMAL LOW (ref >60.00)
Glucose, Bld: 88 mg/dL (ref 70–99)
Potassium: 5 mEq/L (ref 3.5–5.1)
Sodium: 135 mEq/L (ref 135–145)
Total Bilirubin: 0.5 mg/dL (ref 0.2–1.2)
Total Protein: 7.5 g/dL (ref 6.0–8.3)

## 2021-05-07 LAB — TSH: TSH: 1.12 u[IU]/mL (ref 0.35–5.50)

## 2021-05-07 LAB — POCT GLYCOSYLATED HEMOGLOBIN (HGB A1C): Hemoglobin A1C: 5.6 % (ref 4.0–5.6)

## 2021-05-07 LAB — MICROALBUMIN / CREATININE URINE RATIO
Creatinine,U: 37.1 mg/dL
Microalb Creat Ratio: 1.9 mg/g (ref 0.0–30.0)
Microalb, Ur: <0.7 mg/dL (ref 0.0–1.9)

## 2021-05-07 LAB — T4, FREE: Free T4: 1.29 ng/dL (ref 0.60–1.60)

## 2021-05-07 LAB — CBC
HCT: 41.8 % (ref 39.0–52.0)
Hemoglobin: 13.9 g/dL (ref 13.0–17.0)
MCHC: 33.2 g/dL (ref 30.0–36.0)
MCV: 91.3 fl (ref 78.0–100.0)
Platelets: 234 10*3/uL (ref 150.0–400.0)
RBC: 4.58 Mil/uL (ref 4.22–5.81)
RDW: 13.7 % (ref 11.5–15.5)
WBC: 7.9 10*3/uL (ref 4.0–10.5)

## 2021-05-07 NOTE — Progress Notes (Signed)
Please call to let patient know that all the lab results are normal and no further action needed, send to PCP

## 2021-05-07 NOTE — Progress Notes (Signed)
Patient ID: [name] [name], male   DOB: 1945/12/24, 75 y.o.   MRN: [id number]    Reason for Appointment: Endocrinology follow-up  History of Present Illness   HYPOTHYROIDISM, primary: This is long-standing and has been on levothyroxine supplementation  In 12/2017 his levothyroxine was increased from 125 up to 137 mcg daily Subsequently has had normal TSH levels  He feels good now  Labs pending  Lab Results  Component Value Date   TSH 1.12 05/07/2021   TSH 1.88 11/01/2020   TSH 1.99 04/27/2020   FREET4 1.29 05/07/2021   FREET4 1.31 11/01/2020   FREET4 1.42 04/27/2020    PROBLEM 2:   Diagnosis: Type 2 DIABETES MELITUS, date of diagnosis: 2009     Previous history: He has been treated with metformin monotherapy for several years with stable control Unable to exercise much because of chronic back pain  Recent history:   His A1c again is lower than expected at 5.6  Oral hypoglycemic drugs:   metformin ER 1000 mg pcs      Current management, blood sugar readings and problems identified:  His blood sugars at home are still excellent and highest only 158 after dinner Again not checking blood sugars very often and only about every other day with most readings in the morning Only once had a slightly higher reading the day he had his steroid injection in the knee He tries to walk up to quarter mile but has difficulty again with leg and back pain Usually planning meals well and getting healthy foods      Side effects from medications: None       Monitors blood glucose:  about once a day or less .    Glucometer:  One Touch        Blood Glucose readings from download:   PRE-MEAL Fasting Lunch Dinner Bedtime Overall  Glucose range: 74-130    74-158  Mean/median:     113   POST-MEAL PC Breakfast PC Lunch PC Dinner  Glucose range:     Mean/median:   124    RANGE 85-129 with AVERAGE 107    Dietician visit: Most recent: 11/2008    Weight control:  Wt  Readings from Last 3 Encounters:  05/07/21 153 lb (69.4 kg)  11/01/20 155 lb (70.3 kg)  04/30/20 155 lb 9.6 oz (70.6 kg)        Eye exam has been done annually   Diabetes labs:  Lab Results  Component Value Date   HGBA1C 5.6 05/07/2021   HGBA1C 5.6 11/01/2020   HGBA1C 5.9 04/27/2020   Lab Results  Component Value Date   MICROALBUR <0.7 05/07/2021   LDLCALC 36 11/01/2020   CREATININE 1.45 05/07/2021      Allergies as of 05/07/2021       Reactions   Sulfamethoxazole-trimethoprim Other (See Comments)   "Extreme discomfort"   Sulfa Antibiotics Rash   Niaspan [niacin Er] Hives        Medication List        Accurate as of May 07, 2021  4:20 PM. If you have any questions, ask your nurse or doctor.          STOP taking these medications    Slow Fe 142 (45 Fe) MG Tbcr Generic drug: Ferrous Sulfate Stopped by: Elayne Snare, MD       TAKE these medications    amLODipine 5 MG tablet Commonly known as: NORVASC TAKE 1 TABLET BY MOUTH  DAILY   aspirin EC 81 MG tablet Take 81 mg by mouth daily.   atorvastatin 20 MG tablet Commonly known as: LIPITOR TAKE 1 TABLET BY MOUTH  DAILY   esomeprazole 20 MG capsule Commonly known as: NEXIUM Take 20 mg by mouth daily at 12 noon.   fexofenadine 180 MG tablet Commonly known as: ALLEGRA Take 180 mg by mouth daily.   folic acid A999333 MCG tablet Commonly known as: FOLVITE Take 800 mcg by mouth daily.   gabapentin 300 MG capsule Commonly known as: NEURONTIN Take 300 mg by mouth as directed.   levothyroxine 137 MCG tablet Commonly known as: SYNTHROID Take 1 tablet (137 mcg total) by mouth daily before breakfast.   losartan 50 MG tablet Commonly known as: COZAAR TAKE 1 TABLET BY MOUTH  DAILY   metFORMIN 500 MG 24 hr tablet Commonly known as: GLUCOPHAGE-XR TAKE 3 TABLETS BY MOUTH AT  BEDTIME   metoprolol succinate 25 MG 24 hr tablet Commonly known as: TOPROL-XL TAKE 1 TABLET BY MOUTH  DAILY    multivitamin per tablet Take 1 tablet by mouth daily.   niacin 500 MG tablet Take 1 tablet (500 mg total) by mouth at bedtime.   nitroGLYCERIN 0.4 MG SL tablet Commonly known as: NITROSTAT Place 1 tablet (0.4 mg total) under the tongue every 5 (five) minutes as needed for chest pain.   OneTouch Verio test strip Generic drug: glucose blood USE AS DIRECTED TO CHECK  BLOOD SUGAR ONCE DAILY   traMADol 50 MG tablet Commonly known as: ULTRAM Take 50 mg by mouth every 6 (six) hours as needed for severe pain (leg pain).        Allergies:  Allergies  Allergen Reactions   Sulfamethoxazole-Trimethoprim Other (See Comments)    "Extreme discomfort"   Sulfa Antibiotics Rash   Niaspan [Niacin Er] Hives    Past Medical History:  Diagnosis Date   Allergy    AMI (acute mesenteric ischemia)    Arthritis, lumbar spine    Cataract    Colon polyps    Coronary artery disease 01/24/2011   S/p CABG // Myoview 07/2019: EF 51 (visually appears 55-60), no ischemia or infarction, Low Risk   Diabetes mellitus without complication (HCC)    Diverticulosis    GERD (gastroesophageal reflux disease)    Heart attack ([place]) 2004   Hemorrhoids    HTN (hypertension)    Hypothyroidism    Inguinal hernia    Lumbar scoliosis    Rectal bleeding     Past Surgical History:  Procedure Laterality Date   APPENDECTOMY  1966   by-pass     CATARACT EXTRACTION     Bil  /   11/2012   COLONOSCOPY     CORONARY ANGIOPLASTY WITH STENT PLACEMENT     CORONARY ARTERY BYPASS GRAFT  2004   x 5 / one stent   FEMUR IM NAIL Left 05/25/2014   Procedure: INTRAMEDULLARY (IM) NAIL FEMORAL WITH CABLES;  Surgeon: Meredith Pel, MD;  Location: [place];  Service: Orthopedics;  Laterality: Left;   INGUINAL HERNIA REPAIR  1994   right side   lumbar laminotomy  10/2009   POLYPECTOMY      Family History  Problem Relation Age of Onset   Diabetes Mother    Heart disease Father    Colon cancer Cousin     Social  History:  reports that he quit smoking about 47 years ago. His smoking use included cigarettes. He has a 10.00 pack-year smoking  history. He has never used smokeless tobacco. He reports that he does not drink alcohol and does not use drugs.  Review of Systems:  Lipids: LDL has been previously at target of under 70 on a regimen of Lipitor 20 mg daily prescribed by his cardiologist.   Also has been taking niacin for low normal HDL  No recent labs available  Lab Results  Component Value Date   CHOL 113 11/01/2020   HDL 45.90 11/01/2020   LDLCALC 36 11/01/2020   TRIG 154.0 (H) 11/01/2020   CHOLHDL 2 11/01/2020     Hypertension: On losartan 50 mg from cardiologist, home blood pressure range 112-130/60s Previously had been on ramipril  BP Readings from Last 3 Encounters:  05/07/21 110/70  11/01/20 122/84  04/30/20 110/64    Renal dysfunction: Generally stable Etiology unclear   Lab Results  Component Value Date   CREATININE 1.45 05/07/2021   CREATININE 1.46 11/01/2020   CREATININE 1.55 (H) 04/27/2020   Lab Results  Component Value Date   K 5.0 05/07/2021   K 4.9 11/01/2020   K 5.2 No hemolysis seen (H) 04/27/2020     Foot exam done in 8/22, has normal sensory exam  Eye exam 03/2019, negative    Examination:   BP 110/70   Pulse 61   Ht '5\' 7"'$  (1.702 m)   Wt 153 lb (69.4 kg)   SpO2 99%   BMI 23.96 kg/m   Body mass index is 23.96 kg/m.   Diabetic Foot Exam - Simple   Simple Foot Form Diabetic Foot exam was performed with the following findings: Yes   Visual Inspection No deformities, no ulcerations, no other skin breakdown bilaterally: Yes Sensation Testing Intact to touch and monofilament testing bilaterally: Yes Pulse Check Posterior Tibialis and Dorsalis pulse intact bilaterally: Yes See comments: Yes Comments Dorsalis pedis pulses not well felt, posterior tibialis normal      ASSESSMENT/ PLAN:    Diabetes type 2, nonobese:  A1c is excellent at  5.6 again  He has been taking extended release metformin 1000 mg daily with consistent control  His blood sugars are excellent and highest reading 129 after lunch Not doing many readings after dinner but fasting readings are near normal also Has been able to maintain his weight down and eat healthy meals  We will continue the same regimen of 1000 mg Metformin daily  Hypothyroidism, primary and long-standing:   He is taking the same dose of 137 mcg levothyroxine Labs to be checked today to decide on his dosage Subjectively doing well and looks euthyroid   Lipids: To be followed by his cardiologist  Renal dysfunction: Not related to diabetes Will check his labs again  Also to check CBC at his request   Follow-up in 6 months  Xitlally Mooneyham 05/07/2021, 4:20 PM

## 2021-05-21 ENCOUNTER — Telehealth: Payer: Self-pay

## 2021-05-21 NOTE — Telephone Encounter (Signed)
Attempted to reach the pt to arrange follow-up office visit with Dr Burt Knack.  I left the pt a voicemail to contact the office in regards to scheduling (held spot 9/15 at 1:40 PM).

## 2021-05-22 NOTE — Telephone Encounter (Signed)
FYI--patient's wife called back and scheduled for tomorrow, 05/23/21 with Dr. Burt Knack.

## 2021-05-23 ENCOUNTER — Other Ambulatory Visit: Payer: Self-pay

## 2021-05-23 ENCOUNTER — Encounter: Payer: Self-pay | Admitting: Cardiovascular Disease

## 2021-05-23 ENCOUNTER — Ambulatory Visit (INDEPENDENT_AMBULATORY_CARE_PROVIDER_SITE_OTHER): Payer: Medicare Other | Admitting: Cardiovascular Disease

## 2021-05-23 VITALS — BP 128/76 | HR 78 | Ht 67.0 in | Wt 158.0 lb

## 2021-05-23 DIAGNOSIS — I2510 Atherosclerotic heart disease of native coronary artery without angina pectoris: Secondary | ICD-10-CM

## 2021-06-09 ENCOUNTER — Other Ambulatory Visit: Payer: Self-pay

## 2021-06-10 ENCOUNTER — Encounter: Payer: Self-pay | Admitting: Cardiovascular Disease

## 2021-06-10 ENCOUNTER — Ambulatory Visit: Payer: Medicare Other | Admitting: Cardiovascular Disease

## 2021-06-10 ENCOUNTER — Other Ambulatory Visit: Payer: Self-pay

## 2021-06-10 VITALS — BP 100/70 | HR 78 | Ht 68.0 in | Wt 155.0 lb

## 2021-06-10 DIAGNOSIS — I10 Essential (primary) hypertension: Secondary | ICD-10-CM | POA: Diagnosis not present

## 2021-06-10 DIAGNOSIS — E782 Mixed hyperlipidemia: Secondary | ICD-10-CM

## 2021-06-10 DIAGNOSIS — N1831 Chronic kidney disease, stage 3a: Secondary | ICD-10-CM | POA: Diagnosis not present

## 2021-06-10 DIAGNOSIS — I2510 Atherosclerotic heart disease of native coronary artery without angina pectoris: Secondary | ICD-10-CM | POA: Diagnosis not present

## 2021-06-10 DIAGNOSIS — E119 Type 2 diabetes mellitus without complications: Secondary | ICD-10-CM

## 2021-06-10 MED ORDER — METOPROLOL SUCCINATE ER 25 MG PO TB24: 25.0000 mg | ORAL_TABLET | Freq: Every day | ORAL | 3 refills | Status: DC

## 2021-06-10 MED ORDER — METOPROLOL SUCCINATE ER 25 MG PO TB24
25.0000 mg | ORAL_TABLET | Freq: Every day | ORAL | 3 refills | Status: DC
Start: 2021-06-10 — End: 2022-04-21

## 2021-06-10 NOTE — Progress Notes (Signed)
Cardiology Office Note:    Date:  05/23/2021   ID:  Aviel, Davalos 1946-02-13, MRN [id number]  PCP:  Aurea Graff.Marlou Sa, [place] [facility] Cardiologist:  Sherren Mocha, MD    Referring MD: Aurea Graff.Marlou Sa, MD   CC: Follow-up CAD   History of Present Illness:    [name] [name] is a 75 y.o. male with a hx of: Coronary artery disease  S/p CABG 2004 LIMA sequential to diagonal and LAD Left radial to first OM SVG to distal circumflex RIMA to PDA Atrial bigeminy  Hyperlipidemia  Hypertension  Diabetes mellitus 2 GERD Hypothyroidism  The patient is here alone today. About 4 months ago he had an episode when he felt like his heart was 'starved for blood.' This was a brief episode that lasted only 2 minutes. This has not recurred. He is limited by knee problems, but otherwise is doing well without problems. He does yard work and chores around the house without symptoms of chest pain, chest pressure, or shortness of breath. He also has limitation from back pain.     Past Medical History:  Diagnosis Date   Allergy    AMI (acute mesenteric ischemia)    Arthritis, lumbar spine    Cataract    Colon polyps    Coronary artery disease 01/24/2011   S/p CABG // Myoview 07/2019: EF 51 (visually appears 55-60), no ischemia or infarction, Low Risk   Diabetes mellitus without complication (HCC)    Diverticulosis    GERD (gastroesophageal reflux disease)    Heart attack ([place]) 2004   Hemorrhoids    HTN (hypertension)    Hypothyroidism    Inguinal hernia    Lumbar scoliosis    Rectal bleeding     Past Surgical History:  Procedure Laterality Date   APPENDECTOMY  1966   by-pass     CATARACT EXTRACTION     Bil  /   11/2012   COLONOSCOPY     CORONARY ANGIOPLASTY WITH STENT PLACEMENT     CORONARY ARTERY BYPASS GRAFT  2004   x 5 / one stent   FEMUR IM NAIL Left 05/25/2014   Procedure: INTRAMEDULLARY (IM) NAIL FEMORAL WITH CABLES;  Surgeon: Meredith Pel, MD;   Location: [facility];  Service: Orthopedics;  Laterality: Left;   INGUINAL HERNIA REPAIR  1994   right side   lumbar laminotomy  10/2009   POLYPECTOMY      Current Medications: No outpatient medications have been marked as taking for the 05/23/21 encounter (Appointment) with Sherren Mocha, MD.    Current Outpatient Medications on File Prior to Visit  Medication Sig Dispense Refill   amLODipine (NORVASC) 5 MG tablet TAKE 1 TABLET BY MOUTH  DAILY 90 tablet 3   aspirin EC 81 MG tablet Take 81 mg by mouth daily.     atorvastatin (LIPITOR) 20 MG tablet TAKE 1 TABLET BY MOUTH  DAILY 90 tablet 3   esomeprazole (NEXIUM) 20 MG capsule Take 20 mg by mouth daily at 12 noon.     fexofenadine (ALLEGRA) 180 MG tablet Take 180 mg by mouth daily.       folic acid (FOLVITE) 128 MCG tablet Take 800 mcg by mouth daily.      gabapentin (NEURONTIN) 300 MG capsule Take 300 mg by mouth as directed.      levothyroxine (SYNTHROID) 137 MCG tablet Take 1 tablet (137 mcg total) by mouth daily before breakfast. 90 tablet 3   losartan (COZAAR) 50 MG  tablet TAKE 1 TABLET BY MOUTH  DAILY 90 tablet 3   metFORMIN (GLUCOPHAGE-XR) 500 MG 24 hr tablet TAKE 3 TABLETS BY MOUTH AT  BEDTIME 270 tablet 3   multivitamin (THERAGRAN) per tablet Take 1 tablet by mouth daily.       niacin 500 MG tablet Take 1 tablet (500 mg total) by mouth at bedtime. 90 tablet 3   ONETOUCH VERIO test strip USE AS DIRECTED TO CHECK  BLOOD SUGAR ONCE DAILY 100 strip 3   traMADol (ULTRAM) 50 MG tablet Take 50 mg by mouth every 6 (six) hours as needed for severe pain (leg pain).     nitroGLYCERIN (NITROSTAT) 0.4 MG SL tablet Place 1 tablet (0.4 mg total) under the tongue every 5 (five) minutes as needed for chest pain. 25 tablet 3   No current facility-administered medications on file prior to visit.     Allergies:   Sulfamethoxazole-trimethoprim, Sulfa antibiotics, and Niaspan [niacin er]   Social History   Socioeconomic History   Marital status:  Married    Spouse name: Not on file   Number of children: Not on file   Years of education: Not on file   Highest education level: Not on file  Occupational History   Not on file  Tobacco Use   Smoking status: Former    Packs/day: 1.00    Years: 10.00    Pack years: 10.00    Types: Cigarettes    Quit date: 09/08/1973    Years since quitting: 47.7   Smokeless tobacco: Never  Vaping Use   Vaping Use: Never used  Substance and Sexual Activity   Alcohol use: No   Drug use: No   Sexual activity: Not on file  Other Topics Concern   Not on file  Social History Narrative   Not on file   Social Determinants of Health   Financial Resource Strain: Not on file  Food Insecurity: Not on file  Transportation Needs: Not on file  Physical Activity: Not on file  Stress: Not on file  Social Connections: Not on file     Family History: The patient's family history includes Colon cancer in his cousin; Diabetes in his mother; Heart disease in his father.  ROS:   Please see the history of present illness.    All other systems reviewed and are negative.  EKGs/Labs/Other Studies Reviewed:    The following studies were reviewed today: Myoview 07/12/2019: Study Highlights  Nuclear stress EF: 51%. Visually, the EF appears to be be greater than the 51% calculated by the computer. The EF appears to be 55-60% visually. There was no ST segment deviation noted during stress. This is a low risk study. There is no evidence of ischemia or previous infarction. The study is normal.  EKG:  EKG is not ordered today.  The ekg ordered 9/15/202 demonstrates NSR, frequent PAC's, HR 78 bpm, nonspecific ST abnormality  Recent Labs: 05/07/2021: ALT 21; BUN 19; Creatinine, Ser 1.45; Hemoglobin 13.9; Platelets 234.0; Potassium 5.0; Sodium 135; TSH 1.12   Recent Lipid Panel    Component Value Date/Time   CHOL 113 11/01/2020 0848   TRIG 154.0 (H) 11/01/2020 0848   HDL 45.90 11/01/2020 0848   CHOLHDL 2  11/01/2020 0848   VLDL 30.8 11/01/2020 0848   LDLCALC 36 11/01/2020 0848     Risk Assessment/Calculations:           Physical Exam:    Vitals:   06/10/21 0820  BP: 100/70  Pulse: 78  SpO2: 96%     Wt Readings from Last 3 Encounters:  05/07/21 153 lb (69.4 kg)  11/01/20 155 lb (70.3 kg)  04/30/20 155 lb 9.6 oz (70.6 kg)     GEN:  Well nourished, well developed in no acute distress HEENT: Normal NECK: No JVD; No carotid bruits LYMPHATICS: No lymphadenopathy CARDIAC: RRR, no murmurs, rubs, gallops RESPIRATORY:  Clear to auscultation without rales, wheezing or rhonchi  ABDOMEN: Soft, non-tender, non-distended MUSCULOSKELETAL:  No edema; No deformity  SKIN: Warm and dry NEUROLOGIC:  Alert and oriented x 3 PSYCHIATRIC:  Normal affect   ASSESSMENT:    1.  Coronary artery disease, native vessel, without angina 2.  Essential hypertension 3.  Mixed hyperlipidemia 4.  Stage IIIa chronic kidney disease 5.  Type 2 diabetes, without complication, without use of insulin  PLAN:    In order of problems listed above:  The patient is stable on a regimen of aspirin, atorvastatin, metoprolol succinate.  He has no symptoms of angina.  His last stress test from November 2020 shows no ischemia.  He will follow-up in 1 year. Blood pressure is under optimal control on amlodipine, losartan, and metoprolol succinate Lipids are at goal on current therapy which includes atorvastatin 20 mg daily.  His total cholesterol is 113, HDL 46, LDL 36. Renal function is stable.  I reviewed his labs today.  His most recent creatinine is 1.45 mg/dL Hemoglobin A1c is 5.6.  His diabetes is under excellent control.  He is cared for by Dr. Dwyane Dee.        Medication Adjustments/Labs and Tests Ordered: Current medicines are reviewed at length with the patient today.  Concerns regarding medicines are outlined above.  No orders of the defined types were placed in this encounter.  No orders of the  defined types were placed in this encounter.   There are no Patient Instructions on file for this visit.   Signed, Sherren Mocha, MD  05/23/2021 10:36 AM    [place]

## 2021-06-10 NOTE — Patient Instructions (Signed)
Medication Instructions:  Your physician recommends that you continue on your current medications as directed. Please refer to the Current Medication list given to you today. *If you need a refill on your cardiac medications before your next appointment, please call your pharmacy*   Lab Work: none If you have labs (blood work) drawn today and your tests are completely normal, you will receive your results only by: [place] (if you have MyChart) OR A paper copy in the mail If you have any lab test that is abnormal or we need to change your treatment, we will call you to review the results.   Testing/Procedures: none   Follow-Up: At [facility], you and your health needs are our priority.  As part of our continuing mission to provide you with exceptional heart care, we have created designated Provider Care Teams.  These Care Teams include your primary Cardiologist (physician) and Advanced Practice Providers (APPs -  Physician Assistants and Nurse Practitioners) who all work together to provide you with the care you need, when you need it.  We recommend signing up for the patient portal called "MyChart".  Sign up information is provided on this After Visit Summary.  MyChart is used to connect with patients for Virtual Visits (Telemedicine).  Patients are able to view lab/test results, encounter notes, upcoming appointments, etc.  Non-urgent messages can be sent to your provider as well.   To learn more about what you can do with MyChart, go to [email or web address].    Your next appointment:   1 year(s)  The format for your next appointment:   In Person  Provider:   Sherren Mocha, MD   Other Instructions

## 2021-06-22 DIAGNOSIS — Z23 Encounter for immunization: Secondary | ICD-10-CM | POA: Diagnosis not present

## 2021-06-24 ENCOUNTER — Other Ambulatory Visit: Payer: Self-pay | Admitting: Cardiovascular Disease

## 2021-07-16 DIAGNOSIS — H52203 Unspecified astigmatism, bilateral: Secondary | ICD-10-CM | POA: Diagnosis not present

## 2021-07-16 DIAGNOSIS — E119 Type 2 diabetes mellitus without complications: Secondary | ICD-10-CM | POA: Diagnosis not present

## 2021-07-16 DIAGNOSIS — Z961 Presence of intraocular lens: Secondary | ICD-10-CM | POA: Diagnosis not present

## 2021-07-16 LAB — HM DIABETES EYE EXAM

## 2021-08-26 ENCOUNTER — Other Ambulatory Visit: Payer: Self-pay | Admitting: Endocrinology

## 2021-09-24 ENCOUNTER — Other Ambulatory Visit: Payer: Self-pay | Admitting: Endocrinology

## 2021-09-24 DIAGNOSIS — E119 Type 2 diabetes mellitus without complications: Secondary | ICD-10-CM

## 2021-11-05 ENCOUNTER — Other Ambulatory Visit: Payer: Self-pay

## 2021-11-05 ENCOUNTER — Ambulatory Visit: Payer: Medicare Other | Admitting: Endocrinology

## 2021-11-05 ENCOUNTER — Encounter: Payer: Self-pay | Admitting: Endocrinology

## 2021-11-05 VITALS — BP 116/68 | Ht 68.0 in | Wt 149.0 lb

## 2021-11-05 DIAGNOSIS — E782 Mixed hyperlipidemia: Secondary | ICD-10-CM

## 2021-11-05 DIAGNOSIS — E119 Type 2 diabetes mellitus without complications: Secondary | ICD-10-CM | POA: Diagnosis not present

## 2021-11-05 DIAGNOSIS — E063 Autoimmune thyroiditis: Secondary | ICD-10-CM

## 2021-11-05 LAB — COMPREHENSIVE METABOLIC PANEL
ALT: 18 U/L (ref 0–53)
AST: 19 U/L (ref 0–37)
Albumin: 4.5 g/dL (ref 3.5–5.2)
Alkaline Phosphatase: 73 U/L (ref 39–117)
BUN: 15 mg/dL (ref 6–23)
CO2: 27 mEq/L (ref 19–32)
Calcium: 9.7 mg/dL (ref 8.4–10.5)
Chloride: 101 mEq/L (ref 96–112)
Creatinine, Ser: 1.47 mg/dL (ref 0.40–1.50)
GFR: 46.22 mL/min — ABNORMAL LOW (ref >60.00)
Glucose, Bld: 94 mg/dL (ref 70–99)
Potassium: 5 mEq/L (ref 3.5–5.1)
Sodium: 136 mEq/L (ref 135–145)
Total Bilirubin: 0.4 mg/dL (ref 0.2–1.2)
Total Protein: 7.2 g/dL (ref 6.0–8.3)

## 2021-11-05 LAB — T4, FREE: Free T4: 1.17 ng/dL (ref 0.60–1.60)

## 2021-11-05 LAB — LIPID PANEL
Cholesterol: 109 mg/dL (ref 0–200)
HDL: 44.8 mg/dL (ref >39.00)
LDL Cholesterol: 40 mg/dL (ref 0–99)
NonHDL: 64.28
Total CHOL/HDL Ratio: 2
Triglycerides: 121 mg/dL (ref 0.0–149.0)
VLDL: 24.2 mg/dL (ref 0.0–40.0)

## 2021-11-05 LAB — POCT GLYCOSYLATED HEMOGLOBIN (HGB A1C): Hemoglobin A1C: 5.6 % (ref 4.0–5.6)

## 2021-11-05 LAB — TSH: TSH: 4.05 u[IU]/mL (ref 0.35–5.50)

## 2021-11-05 NOTE — Progress Notes (Signed)
Patient ID: [name] [name], male   DOB: August 14, 1946, 76 y.o.   MRN: [id number]    Reason for Appointment: Endocrinology follow-up  History of Present Illness   HYPOTHYROIDISM, primary: This is long-standing and has been on levothyroxine supplementation  In 12/2017 his levothyroxine was increased from 125 up to 137 mcg daily Subsequently has had normal TSH levels  No recent complaints of fatigue or lethargy He has lost a little weight since his last visit  Labs pending  Lab Results  Component Value Date   TSH 1.12 05/07/2021   TSH 1.88 11/01/2020   TSH 1.99 04/27/2020   FREET4 1.29 05/07/2021   FREET4 1.31 11/01/2020   FREET4 1.42 04/27/2020    PROBLEM 2:   Diagnosis: Type 2 DIABETES MELITUS, date of diagnosis: 2009     Previous history: He has been treated with metformin monotherapy for several years with stable control Unable to exercise much because of chronic back pain  Recent history:   His A1c is consistently lower than expected at 5.6 again  Oral hypoglycemic drugs:   metformin ER 1000 mg pcs      Current management, blood sugar readings and problems identified:  He says he is generally eating smaller portions and may have lost a little weight  He is checking blood sugars at various times and these are consistently within target range  No side effects from metformin  As before fasting readings are slightly above normal  Most of his activity level is working around the house and not able to do much formal exercise      Side effects from medications: None       Monitors blood glucose:  about once a day or less .    Glucometer:  One Touch        Blood Glucose readings from download:   PRE-MEAL Fasting Lunch Dinner Bedtime Overall  Glucose range: 91-126    75-156  Mean/median: 110    111   POST-MEAL PC Breakfast PC Lunch PC Dinner  Glucose range:  75-156 104-138  Mean/median:  109 120   Previously:  PRE-MEAL Fasting Lunch Dinner  Bedtime Overall  Glucose range: 74-130    74-158  Mean/median:     113   POST-MEAL PC Breakfast PC Lunch PC Dinner  Glucose range:     Mean/median:   124     Dietician visit: Most recent: 11/2008    Weight control:  Wt Readings from Last 3 Encounters:  11/05/21 149 lb (67.6 kg)  06/10/21 155 lb (70.3 kg)  05/23/21 158 lb (71.7 kg)        Eye exam has been done annually   Diabetes labs:  Lab Results  Component Value Date   HGBA1C 5.6 05/07/2021   HGBA1C 5.6 11/01/2020   HGBA1C 5.9 04/27/2020   Lab Results  Component Value Date   MICROALBUR <0.7 05/07/2021   LDLCALC 36 11/01/2020   CREATININE 1.45 05/07/2021      Allergies as of 11/05/2021       Reactions   Sulfamethoxazole-trimethoprim Other (See Comments)   "Extreme discomfort"   Sulfa Antibiotics Rash   Niaspan [niacin Er] Hives        Medication List        Accurate as of November 05, 2021  8:24 AM. If you have any questions, ask your nurse or doctor.          amLODipine 5 MG tablet Commonly known as: NORVASC TAKE  1 TABLET BY MOUTH  DAILY   aspirin EC 81 MG tablet Take 81 mg by mouth daily.   atorvastatin 20 MG tablet Commonly known as: LIPITOR TAKE 1 TABLET BY MOUTH  DAILY   esomeprazole 20 MG capsule Commonly known as: NEXIUM Take 20 mg by mouth daily at 12 noon.   fexofenadine 180 MG tablet Commonly known as: ALLEGRA Take 180 mg by mouth daily.   folic acid 625 MCG tablet Commonly known as: FOLVITE Take 800 mcg by mouth daily.   gabapentin 300 MG capsule Commonly known as: NEURONTIN Take 300 mg by mouth as directed.   levothyroxine 137 MCG tablet Commonly known as: SYNTHROID TAKE 1 TABLET BY MOUTH  DAILY BEFORE BREAKFAST   losartan 50 MG tablet Commonly known as: COZAAR TAKE 1 TABLET BY MOUTH  DAILY   metFORMIN 500 MG 24 hr tablet Commonly known as: GLUCOPHAGE-XR TAKE 3 TABLETS BY MOUTH AT  BEDTIME   metoprolol succinate 25 MG 24 hr tablet Commonly known as:  TOPROL-XL Take 1 tablet (25 mg total) by mouth daily.   metoprolol succinate 25 MG 24 hr tablet Commonly known as: TOPROL-XL Take 1 tablet (25 mg total) by mouth daily.   multivitamin per tablet Take 1 tablet by mouth daily.   niacin 500 MG tablet Take 1 tablet (500 mg total) by mouth at bedtime.   nitroGLYCERIN 0.4 MG SL tablet Commonly known as: NITROSTAT Place 1 tablet (0.4 mg total) under the tongue every 5 (five) minutes as needed for chest pain.   OneTouch Verio test strip Generic drug: glucose blood USE AS DIRECTED TO CHECK  BLOOD SUGAR ONCE DAILY   traMADol 50 MG tablet Commonly known as: ULTRAM Take 50 mg by mouth every 6 (six) hours as needed for severe pain (leg pain).        Allergies:  Allergies  Allergen Reactions   Sulfamethoxazole-Trimethoprim Other (See Comments)    "Extreme discomfort"   Sulfa Antibiotics Rash   Niaspan [Niacin Er] Hives    Past Medical History:  Diagnosis Date   Allergy    AMI (acute mesenteric ischemia)    Arthritis, lumbar spine    Cataract    Colon polyps    Coronary artery disease 01/24/2011   S/p CABG // Myoview 07/2019: EF 51 (visually appears 55-60), no ischemia or infarction, Low Risk   Diabetes mellitus without complication (HCC)    Diverticulosis    GERD (gastroesophageal reflux disease)    Heart attack ([place]) 2004   Hemorrhoids    HTN (hypertension)    Hypothyroidism    Inguinal hernia    Lumbar scoliosis    Rectal bleeding     Past Surgical History:  Procedure Laterality Date   APPENDECTOMY  1966   by-pass     CATARACT EXTRACTION     Bil  /   11/2012   COLONOSCOPY     CORONARY ANGIOPLASTY WITH STENT PLACEMENT     CORONARY ARTERY BYPASS GRAFT  2004   x 5 / one stent   FEMUR IM NAIL Left 05/25/2014   Procedure: INTRAMEDULLARY (IM) NAIL FEMORAL WITH CABLES;  Surgeon: Meredith Pel, MD;  Location: [facility];  Service: Orthopedics;  Laterality: Left;   INGUINAL HERNIA REPAIR  1994   right side   lumbar  laminotomy  10/2009   POLYPECTOMY      Family History  Problem Relation Age of Onset   Diabetes Mother    Heart disease Father    Colon cancer Cousin  Social History:  reports that he quit smoking about 48 years ago. His smoking use included cigarettes. He has a 10.00 pack-year smoking history. He has never used smokeless tobacco. He reports that he does not drink alcohol and does not use drugs.  Review of Systems:  Lipids: LDL has been previously at target of under 70 on a regimen of Lipitor 20 mg daily prescribed by his cardiologist.   Also has been taking niacin for low normal HDL  No recent labs available  Lab Results  Component Value Date   CHOL 113 11/01/2020   HDL 45.90 11/01/2020   LDLCALC 36 11/01/2020   TRIG 154.0 (H) 11/01/2020   CHOLHDL 2 11/01/2020     Hypertension: On losartan 50 mg from cardiologist, home blood pressure also being monitored Previously had been on ramipril  BP Readings from Last 3 Encounters:  11/05/21 116/68  06/10/21 100/70  05/23/21 128/76    Renal dysfunction: Not related to diabetes Etiology unclear   Lab Results  Component Value Date   CREATININE 1.45 05/07/2021   CREATININE 1.46 11/01/2020   CREATININE 1.55 (H) 04/27/2020   Lab Results  Component Value Date   K 5.0 05/07/2021   K 4.9 11/01/2020   K 5.2 No hemolysis seen (H) 04/27/2020     Foot exam done in 8/22, has normal sensory exam  Eye exam 2022, negative    Examination:   BP 116/68    Ht 5\' 8"  (1.727 m)    Wt 149 lb (67.6 kg)    SpO2 97%    BMI 22.66 kg/m   Body mass index is 22.66 kg/m.   Thyroid not palpable   ASSESSMENT/ PLAN:    Diabetes type 2, nonobese:  A1c is excellent at 5.6 again  He has been taking extended release metformin 1000 mg daily with excellent control  His blood sugars are near normal mostly and highest reading 156 after lunch He is generally watching his diet and able to maintain some activity level despite his  limitations Weight appears to be down slightly  We will not change his prescription again, continue 1000 mg Metformin daily Discussed and he needs to avoid further weight loss and likely can add some low carbohydrate snacks in between meals  Hypothyroidism, primary and long-standing:   He is taking the consistent dose of 137 mcg levothyroxine for some time He is doing well clinically Labs to be checked today to decide on his dosage   Lipids: Needs follow-up, no labs available since 2/22  Renal dysfunction: Not related to diabetes Will check his renal function today   Follow-up in 6 months unless any changes made  Elayne Snare 11/05/2021, 8:24 AM

## 2021-12-02 ENCOUNTER — Other Ambulatory Visit: Payer: Self-pay | Admitting: Cardiovascular Disease

## 2021-12-04 ENCOUNTER — Ambulatory Visit: Payer: Medicare Other | Admitting: Cardiovascular Disease

## 2021-12-11 DIAGNOSIS — M19011 Primary osteoarthritis, right shoulder: Secondary | ICD-10-CM | POA: Diagnosis not present

## 2021-12-11 DIAGNOSIS — M7551 Bursitis of right shoulder: Secondary | ICD-10-CM | POA: Diagnosis not present

## 2021-12-11 DIAGNOSIS — M25511 Pain in right shoulder: Secondary | ICD-10-CM | POA: Diagnosis not present

## 2021-12-25 DIAGNOSIS — D225 Melanocytic nevi of trunk: Secondary | ICD-10-CM | POA: Diagnosis not present

## 2021-12-25 DIAGNOSIS — L853 Xerosis cutis: Secondary | ICD-10-CM | POA: Diagnosis not present

## 2021-12-25 DIAGNOSIS — C4442 Squamous cell carcinoma of skin of scalp and neck: Secondary | ICD-10-CM | POA: Diagnosis not present

## 2021-12-25 DIAGNOSIS — L570 Actinic keratosis: Secondary | ICD-10-CM | POA: Diagnosis not present

## 2021-12-25 DIAGNOSIS — L821 Other seborrheic keratosis: Secondary | ICD-10-CM | POA: Diagnosis not present

## 2021-12-25 DIAGNOSIS — L814 Other melanin hyperpigmentation: Secondary | ICD-10-CM | POA: Diagnosis not present

## 2022-01-03 ENCOUNTER — Other Ambulatory Visit: Payer: Self-pay | Admitting: Cardiovascular Disease

## 2022-01-03 DIAGNOSIS — I10 Essential (primary) hypertension: Secondary | ICD-10-CM

## 2022-01-07 DIAGNOSIS — C4442 Squamous cell carcinoma of skin of scalp and neck: Secondary | ICD-10-CM | POA: Diagnosis not present

## 2022-01-14 ENCOUNTER — Other Ambulatory Visit: Payer: Self-pay | Admitting: Endocrinology

## 2022-03-07 DIAGNOSIS — Z0000 Encounter for general adult medical examination without abnormal findings: Secondary | ICD-10-CM | POA: Diagnosis not present

## 2022-03-07 DIAGNOSIS — K5730 Diverticulosis of large intestine without perforation or abscess without bleeding: Secondary | ICD-10-CM | POA: Diagnosis not present

## 2022-03-07 DIAGNOSIS — I25709 Atherosclerosis of coronary artery bypass graft(s), unspecified, with unspecified angina pectoris: Secondary | ICD-10-CM | POA: Diagnosis not present

## 2022-03-07 DIAGNOSIS — M545 Low back pain, unspecified: Secondary | ICD-10-CM | POA: Diagnosis not present

## 2022-03-07 DIAGNOSIS — D649 Anemia, unspecified: Secondary | ICD-10-CM | POA: Diagnosis not present

## 2022-03-07 DIAGNOSIS — M1990 Unspecified osteoarthritis, unspecified site: Secondary | ICD-10-CM | POA: Diagnosis not present

## 2022-03-07 DIAGNOSIS — I10 Essential (primary) hypertension: Secondary | ICD-10-CM | POA: Diagnosis not present

## 2022-03-12 DIAGNOSIS — R6889 Other general symptoms and signs: Secondary | ICD-10-CM | POA: Diagnosis not present

## 2022-03-19 DIAGNOSIS — R6889 Other general symptoms and signs: Secondary | ICD-10-CM | POA: Diagnosis not present

## 2022-03-19 DIAGNOSIS — K136 Irritative hyperplasia of oral mucosa: Secondary | ICD-10-CM | POA: Diagnosis not present

## 2022-03-19 DIAGNOSIS — K1321 Leukoplakia of oral mucosa, including tongue: Secondary | ICD-10-CM | POA: Diagnosis not present

## 2022-04-06 ENCOUNTER — Other Ambulatory Visit: Payer: Self-pay | Admitting: Endocrinology

## 2022-04-19 ENCOUNTER — Other Ambulatory Visit: Payer: Self-pay | Admitting: Cardiovascular Disease

## 2022-04-21 ENCOUNTER — Other Ambulatory Visit: Payer: Self-pay

## 2022-04-21 DIAGNOSIS — D649 Anemia, unspecified: Secondary | ICD-10-CM | POA: Insufficient documentation

## 2022-04-21 DIAGNOSIS — L309 Dermatitis, unspecified: Secondary | ICD-10-CM | POA: Insufficient documentation

## 2022-04-21 DIAGNOSIS — J309 Allergic rhinitis, unspecified: Secondary | ICD-10-CM | POA: Insufficient documentation

## 2022-04-21 DIAGNOSIS — M419 Scoliosis, unspecified: Secondary | ICD-10-CM | POA: Insufficient documentation

## 2022-05-05 NOTE — Progress Notes (Unsigned)
Patient ID: [name] [name], male   DOB: 1946-03-24, 76 y.o.   MRN: [id number]    Reason for Appointment: Endocrinology follow-up  History of Present Illness   HYPOTHYROIDISM, primary: This is long-standing and has been on levothyroxine supplementation  In 12/2017 his levothyroxine was increased from 125 up to 137 mcg daily Subsequently has had normal TSH levels  No recent complaints of fatigue or lethargy He has lost a little weight since his last visit  Labs pending  Lab Results  Component Value Date   TSH 4.05 11/05/2021   TSH 1.12 05/07/2021   TSH 1.88 11/01/2020   FREET4 1.17 11/05/2021   FREET4 1.29 05/07/2021   FREET4 1.31 11/01/2020    PROBLEM 2:   Diagnosis: Type 2 DIABETES MELITUS, date of diagnosis: 2009     Previous history: He has been treated with metformin monotherapy for several years with stable control Unable to exercise much because of chronic back pain  Recent history:   His A1c is consistently lower than expected at 5.6 again  Oral hypoglycemic drugs:   metformin ER 1000 mg pcs      Current management, blood sugar readings and problems identified:  He says he is generally eating smaller portions and may have lost a little weight  He is checking blood sugars at various times and these are consistently within target range  No side effects from metformin  As before fasting readings are slightly above normal  Most of his activity level is working around the house and not able to do much formal exercise      Side effects from medications: None       Monitors blood glucose:  about once a day or less .    Glucometer:  One Touch        Blood Glucose readings from download:   PRE-MEAL Fasting Lunch Dinner Bedtime Overall  Glucose range: 91-126    75-156  Mean/median: 110    111   POST-MEAL PC Breakfast PC Lunch PC Dinner  Glucose range:  75-156 104-138  Mean/median:  109 120   Previously:  PRE-MEAL Fasting Lunch Dinner  Bedtime Overall  Glucose range: 74-130    74-158  Mean/median:     113   POST-MEAL PC Breakfast PC Lunch PC Dinner  Glucose range:     Mean/median:   124     Dietician visit: Most recent: 11/2008    Weight control:  Wt Readings from Last 3 Encounters:  11/05/21 149 lb (67.6 kg)  06/10/21 155 lb (70.3 kg)  05/23/21 158 lb (71.7 kg)        Eye exam has been done annually   Diabetes labs:  Lab Results  Component Value Date   HGBA1C 5.6 11/05/2021   HGBA1C 5.6 05/07/2021   HGBA1C 5.6 11/01/2020   Lab Results  Component Value Date   MICROALBUR <0.7 05/07/2021   [place] 40 11/05/2021   CREATININE 1.47 11/05/2021      Allergies as of 05/06/2022       Reactions   Sulfamethoxazole-trimethoprim Other (See Comments)   "Extreme discomfort"   Sulfa Antibiotics Rash   Niaspan [niacin Er] Hives        Medication List        Accurate as of May 05, 2022  9:11 PM. If you have any questions, ask your nurse or doctor.          amLODipine 5 MG tablet Commonly known as: NORVASC TAKE  1 TABLET BY MOUTH  DAILY   aspirin EC 81 MG tablet Take 81 mg by mouth daily.   atorvastatin 20 MG tablet Commonly known as: LIPITOR TAKE 1 TABLET BY MOUTH  DAILY   esomeprazole 40 MG capsule Commonly known as: NEXIUM SMARTSIG:1 By Mouth   fexofenadine 180 MG tablet Commonly known as: ALLEGRA Take 180 mg by mouth daily.   folic acid 756 MCG tablet Commonly known as: FOLVITE Take 800 mcg by mouth daily.   gabapentin 300 MG capsule Commonly known as: NEURONTIN Take 300 mg by mouth as directed.   levothyroxine 137 MCG tablet Commonly known as: SYNTHROID TAKE 1 TABLET BY MOUTH  DAILY BEFORE BREAKFAST   losartan 50 MG tablet Commonly known as: COZAAR TAKE 1 TABLET BY MOUTH  DAILY   metFORMIN 500 MG 24 hr tablet Commonly known as: GLUCOPHAGE-XR TAKE 3 TABLETS BY MOUTH AT  BEDTIME   metoprolol succinate 25 MG 24 hr tablet Commonly known as: TOPROL-XL Take 1 tablet  (25 mg total) by mouth daily.   metoprolol succinate 25 MG 24 hr tablet Commonly known as: TOPROL-XL TAKE 1 TABLET BY MOUTH  DAILY   multivitamin per tablet Take 1 tablet by mouth daily.   niacin 500 MG tablet Commonly known as: VITAMIN B3 Take 1 tablet (500 mg total) by mouth at bedtime.   nitroGLYCERIN 0.4 MG SL tablet Commonly known as: NITROSTAT Place 1 tablet (0.4 mg total) under the tongue every 5 (five) minutes as needed for chest pain.   OneTouch Verio test strip Generic drug: glucose blood USE AS DIRECTED TO CHECK  BLOOD SUGAR ONCE DAILY   ramipril 10 MG capsule Commonly known as: ALTACE SMARTSIG:1 By Mouth   traMADol 50 MG tablet Commonly known as: ULTRAM Take 50 mg by mouth every 6 (six) hours as needed for severe pain (leg pain).        Allergies:  Allergies  Allergen Reactions   Sulfamethoxazole-Trimethoprim Other (See Comments)    "Extreme discomfort"   Sulfa Antibiotics Rash   Niaspan [Niacin Er] Hives    Past Medical History:  Diagnosis Date   Allergy    AMI (acute mesenteric ischemia)    Arthritis, lumbar spine    Cataract    Colon polyps    Coronary artery disease 01/24/2011   S/p CABG // Myoview 07/2019: EF 51 (visually appears 55-60), no ischemia or infarction, Low Risk   Diabetes mellitus without complication (HCC)    Diverticulosis    GERD (gastroesophageal reflux disease)    Heart attack ([place]) 2004   Hemorrhoids    HTN (hypertension)    Hypothyroidism    Inguinal hernia    Lumbar scoliosis    Rectal bleeding     Past Surgical History:  Procedure Laterality Date   APPENDECTOMY  1966   by-pass     CATARACT EXTRACTION     Bil  /   11/2012   COLONOSCOPY     CORONARY ANGIOPLASTY WITH STENT PLACEMENT     CORONARY ARTERY BYPASS GRAFT  2004   x 5 / one stent   FEMUR IM NAIL Left 05/25/2014   Procedure: INTRAMEDULLARY (IM) NAIL FEMORAL WITH CABLES;  Surgeon: Meredith Pel, MD;  Location: [facility];  Service: Orthopedics;   Laterality: Left;   INGUINAL HERNIA REPAIR  1994   right side   lumbar laminotomy  10/2009   POLYPECTOMY      Family History  Problem Relation Age of Onset   Diabetes Mother  Heart disease Father    Colon cancer Cousin     Social History:  reports that he quit smoking about 48 years ago. His smoking use included cigarettes. He has a 10.00 pack-year smoking history. He has never used smokeless tobacco. He reports that he does not drink alcohol and does not use drugs.  Review of Systems:  Lipids: LDL has been previously at target of under 70 on a regimen of Lipitor 20 mg daily prescribed by his cardiologist.   Also has been taking niacin for low normal HDL  No recent labs available  Lab Results  Component Value Date   CHOL 109 11/05/2021   HDL 44.80 11/05/2021   LDLCALC 40 11/05/2021   TRIG 121.0 11/05/2021   CHOLHDL 2 11/05/2021     Hypertension: On losartan 50 mg from cardiologist, home blood pressure also being monitored Previously had been on ramipril  BP Readings from Last 3 Encounters:  11/05/21 116/68  06/10/21 100/70  05/23/21 128/76    Renal dysfunction: Not related to diabetes Etiology unclear   Lab Results  Component Value Date   CREATININE 1.47 11/05/2021   CREATININE 1.45 05/07/2021   CREATININE 1.46 11/01/2020   Lab Results  Component Value Date   K 5.0 11/05/2021   K 5.0 05/07/2021   K 4.9 11/01/2020     Foot exam done in 8/22, has normal sensory exam  Eye exam 2022, negative    Examination:   There were no vitals taken for this visit.  There is no height or weight on file to calculate BMI.   Thyroid not palpable   ASSESSMENT/ PLAN:    Diabetes type 2, nonobese:  A1c is excellent at 5.6 again  He has been taking extended release metformin 1000 mg daily with excellent control  His blood sugars are near normal mostly and highest reading 156 after lunch He is generally watching his diet and able to maintain some activity  level despite his limitations Weight appears to be down slightly  We will not change his prescription again, continue 1000 mg Metformin daily Discussed and he needs to avoid further weight loss and likely can add some low carbohydrate snacks in between meals  Hypothyroidism, primary and long-standing:   He is taking the consistent dose of 137 mcg levothyroxine for some time He is doing well clinically Labs to be checked today to decide on his dosage   Lipids: Needs follow-up, no labs available since 2/22  Renal dysfunction: Not related to diabetes Will check his renal function today   Follow-up in 6 months unless any changes made  Elayne Snare 05/05/2022, 9:11 PM

## 2022-05-06 ENCOUNTER — Encounter: Payer: Self-pay | Admitting: Endocrinology

## 2022-05-06 ENCOUNTER — Ambulatory Visit: Payer: Medicare Other | Admitting: Endocrinology

## 2022-05-06 VITALS — BP 110/64 | HR 58 | Ht 68.0 in | Wt 152.4 lb

## 2022-05-06 DIAGNOSIS — R0989 Other specified symptoms and signs involving the circulatory and respiratory systems: Secondary | ICD-10-CM | POA: Diagnosis not present

## 2022-05-06 DIAGNOSIS — E119 Type 2 diabetes mellitus without complications: Secondary | ICD-10-CM

## 2022-05-06 DIAGNOSIS — E782 Mixed hyperlipidemia: Secondary | ICD-10-CM

## 2022-05-06 DIAGNOSIS — E063 Autoimmune thyroiditis: Secondary | ICD-10-CM | POA: Diagnosis not present

## 2022-05-06 LAB — COMPREHENSIVE METABOLIC PANEL
ALT: 27 U/L (ref 0–53)
AST: 27 U/L (ref 0–37)
Albumin: 4.2 g/dL (ref 3.5–5.2)
Alkaline Phosphatase: 60 U/L (ref 39–117)
BUN: 19 mg/dL (ref 6–23)
CO2: 27 mEq/L (ref 19–32)
Calcium: 9.6 mg/dL (ref 8.4–10.5)
Chloride: 102 mEq/L (ref 96–112)
Creatinine, Ser: 1.53 mg/dL — ABNORMAL HIGH (ref 0.40–1.50)
GFR: 43.9 mL/min — ABNORMAL LOW (ref >60.00)
Glucose, Bld: 86 mg/dL (ref 70–99)
Potassium: 5.2 mEq/L — ABNORMAL HIGH (ref 3.5–5.1)
Sodium: 137 mEq/L (ref 135–145)
Total Bilirubin: 0.4 mg/dL (ref 0.2–1.2)
Total Protein: 7.2 g/dL (ref 6.0–8.3)

## 2022-05-06 LAB — LIPID PANEL
Cholesterol: 110 mg/dL (ref 0–200)
HDL: 44.6 mg/dL (ref >39.00)
LDL Cholesterol: 40 mg/dL (ref 0–99)
NonHDL: 65.18
Total CHOL/HDL Ratio: 2
Triglycerides: 124 mg/dL (ref 0.0–149.0)
VLDL: 24.8 mg/dL (ref 0.0–40.0)

## 2022-05-06 LAB — POCT GLYCOSYLATED HEMOGLOBIN (HGB A1C): Hemoglobin A1C: 5.5 % (ref 4.0–5.6)

## 2022-05-06 LAB — TSH: TSH: 3.46 u[IU]/mL (ref 0.35–5.50)

## 2022-05-06 LAB — T4, FREE: Free T4: 1.15 ng/dL (ref 0.60–1.60)

## 2022-05-06 LAB — MICROALBUMIN / CREATININE URINE RATIO
Creatinine,U: 43 mg/dL
Microalb Creat Ratio: 1.6 mg/g (ref 0.0–30.0)
Microalb, Ur: <0.7 mg/dL (ref 0.0–1.9)

## 2022-05-06 NOTE — Addendum Note (Signed)
Addended by: Cinda Quest on: 05/06/2022 10:24 AM   Modules accepted: Orders

## 2022-05-07 NOTE — Progress Notes (Signed)
Potassium is slightly high, may be related to blood pressure medicines.  Need to clarify whether he is taking losartan and ramipril together.  Forwarding to Dr. Burt Knack for action

## 2022-05-08 ENCOUNTER — Telehealth: Payer: Self-pay

## 2022-05-08 DIAGNOSIS — E875 Hyperkalemia: Secondary | ICD-10-CM

## 2022-05-08 NOTE — Telephone Encounter (Signed)
The patient has been notified of the result and verbalized understanding.  All questions (if any) were answered. Michaelyn Barter, RN 05/08/2022 6:00 PM   Ordered BMET and updated medication list. Patient will come in on 05/22/22 for lab work.

## 2022-05-08 NOTE — Telephone Encounter (Signed)
-----   Message from Sherren Mocha, MD sent at 05/08/2022 11:49 AM EDT ----- Agree. Judson Roch - could you reach out and see if he is taking losartan and ramipril. If so, please have him stop ramipril and repeat BMET in a few weeks. thanks

## 2022-05-09 ENCOUNTER — Encounter: Payer: Self-pay | Admitting: Cardiovascular Disease

## 2022-05-22 ENCOUNTER — Ambulatory Visit: Payer: Medicare Other | Attending: Cardiovascular Disease

## 2022-05-22 DIAGNOSIS — E875 Hyperkalemia: Secondary | ICD-10-CM

## 2022-05-22 LAB — BASIC METABOLIC PANEL
BUN/Creatinine Ratio: 12 (ref 10–24)
BUN: 18 mg/dL (ref 8–27)
CO2: 22 mmol/L (ref 20–29)
Calcium: 9.5 mg/dL (ref 8.6–10.2)
Chloride: 99 mmol/L (ref 96–106)
Creatinine, Ser: 1.45 mg/dL — ABNORMAL HIGH (ref 0.76–1.27)
Glucose: 92 mg/dL (ref 70–99)
Potassium: 5 mmol/L (ref 3.5–5.2)
Sodium: 137 mmol/L (ref 134–144)
eGFR: 50 mL/min/{1.73_m2} — ABNORMAL LOW (ref >59)

## 2022-05-23 ENCOUNTER — Other Ambulatory Visit: Payer: Self-pay | Admitting: Cardiovascular Disease

## 2022-05-23 DIAGNOSIS — I10 Essential (primary) hypertension: Secondary | ICD-10-CM

## 2022-06-16 ENCOUNTER — Encounter: Payer: Self-pay | Admitting: Cardiovascular Disease

## 2022-06-16 ENCOUNTER — Ambulatory Visit: Payer: Medicare Other | Attending: Cardiovascular Disease | Admitting: Cardiovascular Disease

## 2022-06-16 VITALS — BP 120/70 | HR 85 | Ht 68.0 in | Wt 150.4 lb

## 2022-06-16 DIAGNOSIS — I498 Other specified cardiac arrhythmias: Secondary | ICD-10-CM

## 2022-06-16 DIAGNOSIS — E119 Type 2 diabetes mellitus without complications: Secondary | ICD-10-CM | POA: Diagnosis not present

## 2022-06-16 DIAGNOSIS — N1831 Chronic kidney disease, stage 3a: Secondary | ICD-10-CM | POA: Diagnosis not present

## 2022-06-16 DIAGNOSIS — I2510 Atherosclerotic heart disease of native coronary artery without angina pectoris: Secondary | ICD-10-CM | POA: Diagnosis not present

## 2022-06-16 DIAGNOSIS — E782 Mixed hyperlipidemia: Secondary | ICD-10-CM | POA: Diagnosis not present

## 2022-06-16 DIAGNOSIS — I10 Essential (primary) hypertension: Secondary | ICD-10-CM

## 2022-06-16 NOTE — Progress Notes (Signed)
Cardiology Office Note:    Date:  06/16/2022   ID:  [name], [name] 1946-06-05, MRN [id number]  PCP:  Collene Leyden, [facility] Cardiologist:  Sherren Mocha, MD     Referring MD: Aurea Graff.Marlou Sa, MD   Chief Complaint  Patient presents with   Coronary Artery Disease    History of Present Illness:    [name] [name] is a 76 y.o. male with a hx of: Coronary artery disease  S/p CABG 2004 LIMA sequential to diagonal and LAD Left radial to first OM SVG to distal circumflex RIMA to PDA Atrial bigeminy  Hyperlipidemia  Hypertension  Diabetes mellitus 2 GERD Hypothyroidism  The patient is here alone today. He is really struggling with arthritis in his shoulders. Tylenol doesn't help him and he can't take NSAID's on a regular basis because of CKD, CAD, etc. He's followed by his PCP and has osteoarthritis. No cardiac symptoms reported today. Today, he denies symptoms of palpitations, chest pain, shortness of breath, orthopnea, PND, lower extremity edema, dizziness, or syncope.   Past Medical History:  Diagnosis Date   Allergy    AMI (acute mesenteric ischemia)    Arthritis, lumbar spine    Cataract    Colon polyps    Coronary artery disease 01/24/2011   S/p CABG // Myoview 07/2019: EF 51 (visually appears 55-60), no ischemia or infarction, Low Risk   Diabetes mellitus without complication (HCC)    Diverticulosis    GERD (gastroesophageal reflux disease)    Heart attack ([place]) 2004   Hemorrhoids    HTN (hypertension)    Hypothyroidism    Inguinal hernia    Lumbar scoliosis    Rectal bleeding     Past Surgical History:  Procedure Laterality Date   APPENDECTOMY  1966   by-pass     CATARACT EXTRACTION     Bil  /   11/2012   COLONOSCOPY     CORONARY ANGIOPLASTY WITH STENT PLACEMENT     CORONARY ARTERY BYPASS GRAFT  2004   x 5 / one stent   FEMUR IM NAIL Left 05/25/2014   Procedure: INTRAMEDULLARY (IM) NAIL FEMORAL WITH CABLES;  Surgeon:  Meredith Pel, MD;  Location: [facility];  Service: Orthopedics;  Laterality: Left;   INGUINAL HERNIA REPAIR  1994   right side   lumbar laminotomy  10/2009   POLYPECTOMY      Current Medications: Current Meds  Medication Sig   amLODipine (NORVASC) 5 MG tablet TAKE 1 TABLET BY MOUTH  DAILY   aspirin EC 81 MG tablet Take 81 mg by mouth daily.   atorvastatin (LIPITOR) 20 MG tablet TAKE 1 TABLET BY MOUTH ONCE  DAILY   esomeprazole (NEXIUM) 40 MG capsule SMARTSIG:1 By Mouth   fexofenadine (ALLEGRA) 180 MG tablet Take 180 mg by mouth daily.     folic acid (FOLVITE) 628 MCG tablet Take 800 mcg by mouth daily.    gabapentin (NEURONTIN) 300 MG capsule Take 300 mg by mouth as directed.    levothyroxine (SYNTHROID) 137 MCG tablet TAKE 1 TABLET BY MOUTH  DAILY BEFORE BREAKFAST   losartan (COZAAR) 50 MG tablet TAKE 1 TABLET BY MOUTH DAILY   metFORMIN (GLUCOPHAGE-XR) 500 MG 24 hr tablet TAKE 3 TABLETS BY MOUTH AT  BEDTIME   metoprolol succinate (TOPROL-XL) 25 MG 24 hr tablet TAKE 1 TABLET BY MOUTH  DAILY   multivitamin (THERAGRAN) per tablet Take 1 tablet by mouth daily.     niacin  500 MG tablet Take 1 tablet (500 mg total) by mouth at bedtime.   ONETOUCH VERIO test strip USE AS DIRECTED TO CHECK  BLOOD SUGAR ONCE DAILY   traMADol (ULTRAM) 50 MG tablet Take 50 mg by mouth every 6 (six) hours as needed for severe pain (leg pain).     Allergies:   Sulfamethoxazole-trimethoprim, Sulfa antibiotics, and Niaspan [niacin er]   Social History   Socioeconomic History   Marital status: Married    Spouse name: Not on file   Number of children: Not on file   Years of education: Not on file   Highest education level: Not on file  Occupational History   Not on file  Tobacco Use   Smoking status: Former    Packs/day: 1.00    Years: 10.00    Total pack years: 10.00    Types: Cigarettes    Quit date: 09/08/1973    Years since quitting: 48.8   Smokeless tobacco: Never  Vaping Use   Vaping Use: Never  used  Substance and Sexual Activity   Alcohol use: No   Drug use: No   Sexual activity: Not on file  Other Topics Concern   Not on file  Social History Narrative   Not on file   Social Determinants of Health   Financial Resource Strain: Not on file  Food Insecurity: Not on file  Transportation Needs: Not on file  Physical Activity: Not on file  Stress: Not on file  Social Connections: Not on file     Family History: The patient's family history includes Colon cancer in his cousin; Diabetes in his mother; Heart disease in his father.  ROS:   Please see the history of present illness.    All other systems reviewed and are negative.  EKGs/Labs/Other Studies Reviewed:    The following studies were reviewed today: Myoview Scan 07/12/2019: Nuclear stress EF: 51%. Visually, the EF appears to be be greater than the 51% calculated by the computer. The EF appears to be 55-60% visually. There was no ST segment deviation noted during stress. This is a low risk study. There is no evidence of ischemia or previous infarction. The study is normal.  EKG:  EKG is ordered today.  The ekg ordered today demonstrates NSR 85 bpm, frequent PAC's in a pattern of atrial bigeminy  Recent Labs: 05/06/2022: ALT 27; TSH 3.46 05/22/2022: BUN 18; Creatinine, Ser 1.45; Potassium 5.0; Sodium 137  Recent Lipid Panel    Component Value Date/Time   CHOL 110 05/06/2022 0833   TRIG 124.0 05/06/2022 0833   HDL 44.60 05/06/2022 0833   CHOLHDL 2 05/06/2022 0833   VLDL 24.8 05/06/2022 0833   LDLCALC 40 05/06/2022 0833     Risk Assessment/Calculations:                Physical Exam:    VS:  BP 120/70   Pulse 85   Ht '5\' 8"'$  (1.727 m)   Wt 150 lb 6.4 oz (68.2 kg)   SpO2 98%   BMI 22.87 kg/m     Wt Readings from Last 3 Encounters:  06/16/22 150 lb 6.4 oz (68.2 kg)  05/06/22 152 lb 6.4 oz (69.1 kg)  11/05/21 149 lb (67.6 kg)     GEN:  Well nourished, well developed in no acute distress HEENT:  Normal NECK: No JVD; No carotid bruits LYMPHATICS: No lymphadenopathy CARDIAC: RRR, no murmurs, rubs, gallops RESPIRATORY:  Clear to auscultation without rales, wheezing or rhonchi  ABDOMEN: Soft,  non-tender, non-distended MUSCULOSKELETAL:  No edema; No deformity  SKIN: Warm and dry NEUROLOGIC:  Alert and oriented x 3 PSYCHIATRIC:  Normal affect   ASSESSMENT:    1. Coronary artery disease involving native coronary artery of native heart without angina pectoris   2. Mixed hyperlipidemia   3. Essential hypertension   4. Stage 3a chronic kidney disease ([place])   5. Type 2 diabetes mellitus without complication, without long-term current use of insulin ([place])   6. Atrial bigeminy    PLAN:    In order of problems listed above:  The patient is currently stable with no symptoms of angina.  He has excellent control of his CV risk factors with an LDL cholesterol of 40, well-controlled blood pressure of 120/70, and on appropriate medical therapy as outlined above.  I will see him back in 1 year for follow-up evaluation. Treated with atorvastatin 20 mg daily.  LDL 40. Blood pressure is under ideal control on a combination of amlodipine, metoprolol succinate, and losartan. Reviewed most recent labs with a creatinine of 1.45 which is unchanged from his creatinine level last year.  Emphasized the importance of adequate fluid hydration and avoidance of nephrotoxic drugs.  He will use nonsteroidal anti-inflammatories very sparingly when his arthritis is particularly bad. Followed closely by Dr. Dwyane Dee.  Most recent A1c 5.5.  Patient has excellent control of his diabetes. Incidental finding on his ECG.  Completely asymptomatic.  Treated with a beta-blocker.       Medication Adjustments/Labs and Tests Ordered: Current medicines are reviewed at length with the patient today.  Concerns regarding medicines are outlined above.  Orders Placed This Encounter  Procedures   EKG 12-Lead   No orders of the  defined types were placed in this encounter.   Patient Instructions  Medication Instructions:  Your physician recommends that you continue on your current medications as directed. Please refer to the Current Medication list given to you today.  *If you need a refill on your cardiac medications before your next appointment, please call your pharmacy*   Lab Work: NONE If you have labs (blood work) drawn today and your tests are completely normal, you will receive your results only by: [place] (if you have MyChart) OR A paper copy in the mail If you have any lab test that is abnormal or we need to change your treatment, we will call you to review the results.   Testing/Procedures: NONE  Follow-Up: At [facility], you and your health needs are our priority.  As part of our continuing mission to provide you with exceptional heart care, we have created designated Provider Care Teams.  These Care Teams include your primary Cardiologist (physician) and Advanced Practice Providers (APPs -  Physician Assistants and Nurse Practitioners) who all work together to provide you with the care you need, when you need it.  Your next appointment:   1 year(s)  The format for your next appointment:   In Person  Provider:   Sherren Mocha, MD       Important Information About Sugar         Signed, Sherren Mocha, MD  06/16/2022 4:32 PM    [place]

## 2022-06-16 NOTE — Patient Instructions (Signed)
Medication Instructions:  Your physician recommends that you continue on your current medications as directed. Please refer to the Current Medication list given to you today.  *If you need a refill on your cardiac medications before your next appointment, please call your pharmacy*   Lab Work: NONE If you have labs (blood work) drawn today and your tests are completely normal, you will receive your results only by: MyChart Message (if you have MyChart) OR A paper copy in the mail If you have any lab test that is abnormal or we need to change your treatment, we will call you to review the results.   Testing/Procedures: NONE  Follow-Up: At [facility], you and your health needs are our priority.  As part of our continuing mission to provide you with exceptional heart care, we have created designated Provider Care Teams.  These Care Teams include your primary Cardiologist (physician) and Advanced Practice Providers (APPs -  Physician Assistants and Nurse Practitioners) who all work together to provide you with the care you need, when you need it.  Your next appointment:   1 year(s)  The format for your next appointment:   In Person  Provider:   Michael Cooper, MD       Important Information About Sugar       

## 2022-06-28 DIAGNOSIS — Z23 Encounter for immunization: Secondary | ICD-10-CM | POA: Diagnosis not present

## 2022-07-14 ENCOUNTER — Encounter: Payer: Self-pay | Admitting: Orthopedic Surgery

## 2022-07-14 ENCOUNTER — Ambulatory Visit (INDEPENDENT_AMBULATORY_CARE_PROVIDER_SITE_OTHER): Payer: Medicare Other

## 2022-07-14 ENCOUNTER — Ambulatory Visit: Payer: Medicare Other | Admitting: Orthopedic Surgery

## 2022-07-14 DIAGNOSIS — M79605 Pain in left leg: Secondary | ICD-10-CM

## 2022-07-14 MED ORDER — PREDNISONE 5 MG (21) PO TBPK: ORAL_TABLET | ORAL | 0 refills | Status: DC

## 2022-07-14 NOTE — Progress Notes (Signed)
Office Visit Note   Patient: [name] [name]           Date of Birth: [birth year]           MRN: [id number] Visit Date: 07/14/2022 Requested by: Collene Leyden, MD [address] [place],  [place] [address] PCP: Collene Leyden, MD  Subjective: Chief Complaint  Patient presents with   Left Leg - Pain    HPI: [name] [name] is a 76 y.o. male who presents to the office reporting left leg pain.  Had a fall 2 weeks ago and landed on the left lower extremity primarily the anterior thigh region.  He is concerned that he may have disrupted his hardware from surgery several years ago.  He does report some back pain and has a history of 16 epidural steroid injections.  Pain does not radiate below the knee.  Did have an MRI scan in 2018 which showed rather significant residual left-sided nerve compression and this was done after his surgery.  Opioids have not helped him.  He does take tramadol and has chronic kidney disease stage III.  He is also on Neurontin..                ROS: All systems reviewed are negative as they relate to the chief complaint within the history of present illness.  Patient denies fevers or chills.  Assessment & Plan: Visit Diagnoses:  1. Pain in left leg     Plan: Impression is left leg pain which looks to be primarily muscular at this time.  Radiographs look good in terms of no acute fracture around the rather extensive hardware in the entire left femur.  Plan is Medrol Dosepak 6-day course with repeat evaluation and possible further imaging if symptoms persist.  Follow-Up Instructions: No follow-ups on file.   Orders:  Orders Placed This Encounter  Procedures   XR FEMUR MIN 2 VIEWS LEFT   Meds ordered this encounter  Medications   predniSONE (STERAPRED UNI-PAK 21 TAB) 5 MG (21) TBPK tablet    Sig: Take dosepak as directed    Dispense:  21 tablet    Refill:  0      Procedures: No procedures performed   Clinical Data: No additional  findings.  Objective: Vital Signs: There were no vitals taken for this visit.  Physical Exam:  Constitutional: Patient appears well-developed HEENT:  Head: Normocephalic Eyes:EOM are normal Neck: Normal range of motion Cardiovascular: Normal rate Pulmonary/chest: Effort normal Neurologic: Patient is alert Skin: Skin is warm Psychiatric: Patient has normal mood and affect  Ortho Exam: Ortho exam demonstrates relatively normal gait and alignment.  No groin pain on the left with internal/external rotation of the leg.  Has 5 out of 5 ankle dorsiflexion plantarflexion quad and hamstring strength along with hip flexor strength abduction and adduction strength on both sides.  No bruising or ecchymosis noted in that left thigh region.  Knee range of motion full.  Specialty Comments:  No specialty comments available.  Imaging: XR FEMUR MIN 2 VIEWS LEFT  Result Date: 07/14/2022 2 views left femur reviewed.  Intramedullary nail with cable fixation proximally into distal interlocking screws noted.  No acute fracture.  No hardware complication.  Abundant callus formation present around the subtrochanteric region of the femur.    PMFS History: Patient Active Problem List   Diagnosis Date Noted   Allergic rhinitis 04/21/2022   Anemia 04/21/2022   Eczema 04/21/2022   Scoliosis 04/21/2022   Iron  deficiency anemia 05/25/2017   Type 2 diabetes mellitus with complication, without long-term current use of insulin ([place]) 05/18/2017   Chronic back pain 02/08/2015   Lumbar radiculopathy 02/08/2015   Lumbar spinal stenosis 02/08/2015   Femur fracture ([place]) 05/25/2014   Diabetes ([place]) 07/28/2013   CAD (coronary artery disease) 01/24/2011   Hyperlipemia 01/24/2011   COLONIC POLYPS 12/23/2007   HYPOTHYROIDISM 12/23/2007   HYPERTENSION 12/23/2007   AMI 12/23/2007   HEMORRHOIDS 12/23/2007   GERD 12/23/2007   DIVERTICULOSIS, COLON 12/23/2007   RECTAL BLEEDING 12/23/2007   INGUINAL HERNIA, HX OF  12/23/2007   Past Medical History:  Diagnosis Date   Allergy    AMI (acute mesenteric ischemia)    Arthritis, lumbar spine    Cataract    Colon polyps    Coronary artery disease 01/24/2011   S/p CABG // Myoview 07/2019: EF 51 (visually appears 55-60), no ischemia or infarction, Low Risk   Diabetes mellitus without complication (HCC)    Diverticulosis    GERD (gastroesophageal reflux disease)    Heart attack ([place]) 2004   Hemorrhoids    HTN (hypertension)    Hypothyroidism    Inguinal hernia    Lumbar scoliosis    Rectal bleeding     Family History  Problem Relation Age of Onset   Diabetes Mother    Heart disease Father    Colon cancer Cousin     Past Surgical History:  Procedure Laterality Date   APPENDECTOMY  1966   by-pass     CATARACT EXTRACTION     Bil  /   11/2012   COLONOSCOPY     CORONARY ANGIOPLASTY WITH STENT PLACEMENT     CORONARY ARTERY BYPASS GRAFT  2004   x 5 / one stent   FEMUR IM NAIL Left 05/25/2014   Procedure: INTRAMEDULLARY (IM) NAIL FEMORAL WITH CABLES;  Surgeon: Meredith Pel, MD;  Location: [facility];  Service: Orthopedics;  Laterality: Left;   INGUINAL HERNIA REPAIR  1994   right side   lumbar laminotomy  10/2009   POLYPECTOMY     Social History   Occupational History   Not on file  Tobacco Use   Smoking status: Former    Packs/day: 1.00    Years: 10.00    Total pack years: 10.00    Types: Cigarettes    Quit date: 09/08/1973    Years since quitting: 48.8   Smokeless tobacco: Never  Vaping Use   Vaping Use: Never used  Substance and Sexual Activity   Alcohol use: No   Drug use: No   Sexual activity: Not on file

## 2022-07-24 ENCOUNTER — Other Ambulatory Visit: Payer: Self-pay

## 2022-07-24 DIAGNOSIS — E119 Type 2 diabetes mellitus without complications: Secondary | ICD-10-CM

## 2022-07-24 MED ORDER — LEVOTHYROXINE SODIUM 137 MCG PO TABS: 137.0000 ug | ORAL_TABLET | Freq: Every day | ORAL | 1 refills | Status: DC

## 2022-08-11 ENCOUNTER — Encounter: Payer: Self-pay | Admitting: Cardiovascular Disease

## 2022-08-11 ENCOUNTER — Encounter: Payer: Self-pay | Admitting: Endocrinology

## 2022-08-12 ENCOUNTER — Other Ambulatory Visit: Payer: Self-pay | Admitting: Cardiovascular Disease

## 2022-08-12 DIAGNOSIS — I10 Essential (primary) hypertension: Secondary | ICD-10-CM

## 2022-08-13 ENCOUNTER — Other Ambulatory Visit: Payer: Self-pay | Admitting: Cardiovascular Disease

## 2022-08-13 DIAGNOSIS — I10 Essential (primary) hypertension: Secondary | ICD-10-CM

## 2022-08-13 MED ORDER — LOSARTAN POTASSIUM 50 MG PO TABS: 50.0000 mg | ORAL_TABLET | Freq: Every day | ORAL | 3 refills | Status: DC

## 2022-08-13 NOTE — Telephone Encounter (Signed)
[facility] is requesting 100 tablets in place of 90 day with 3 refills. Rx note states due to a better benefit for patient's insurance. Please advise. Thank you

## 2022-08-13 NOTE — Telephone Encounter (Signed)
Per OV note on 06/26/22: The patient is currently stable with no symptoms of angina.  He has excellent control of his CV risk factors with an LDL cholesterol of 40, well-controlled blood pressure of 120/70, and on appropriate medical therapy as outlined above.  I will see him back in 1 year for follow-up evaluation. Treated with atorvastatin 20 mg daily.  LDL 40. Blood pressure is under ideal control on a combination of amlodipine, metoprolol succinate, and losartan.  Pharmacy is requesting RX for 100 tablets, instead of 90, as patient has same copay. Will send at this time along with Losartan '50mg'$  daily as well.

## 2022-10-03 ENCOUNTER — Other Ambulatory Visit: Payer: Self-pay | Admitting: Endocrinology

## 2022-10-03 DIAGNOSIS — E119 Type 2 diabetes mellitus without complications: Secondary | ICD-10-CM

## 2022-11-07 ENCOUNTER — Encounter: Payer: Self-pay | Admitting: Endocrinology

## 2022-11-07 ENCOUNTER — Ambulatory Visit: Payer: Medicare Other | Admitting: Endocrinology

## 2022-11-07 VITALS — BP 144/78 | HR 63 | Wt 147.2 lb

## 2022-11-07 DIAGNOSIS — E063 Autoimmune thyroiditis: Secondary | ICD-10-CM | POA: Diagnosis not present

## 2022-11-07 DIAGNOSIS — Z8639 Personal history of other endocrine, nutritional and metabolic disease: Secondary | ICD-10-CM | POA: Diagnosis not present

## 2022-11-07 DIAGNOSIS — E119 Type 2 diabetes mellitus without complications: Secondary | ICD-10-CM | POA: Diagnosis not present

## 2022-11-07 DIAGNOSIS — I10 Essential (primary) hypertension: Secondary | ICD-10-CM | POA: Diagnosis not present

## 2022-11-07 LAB — COMPREHENSIVE METABOLIC PANEL
ALT: 20 U/L (ref 0–53)
AST: 24 U/L (ref 0–37)
Albumin: 4.4 g/dL (ref 3.5–5.2)
Alkaline Phosphatase: 76 U/L (ref 39–117)
BUN: 17 mg/dL (ref 6–23)
CO2: 27 mEq/L (ref 19–32)
Calcium: 10.1 mg/dL (ref 8.4–10.5)
Chloride: 98 mEq/L (ref 96–112)
Creatinine, Ser: 1.43 mg/dL (ref 0.40–1.50)
GFR: 47.45 mL/min — ABNORMAL LOW (ref >60.00)
Glucose, Bld: 86 mg/dL (ref 70–99)
Potassium: 5.4 mEq/L — ABNORMAL HIGH (ref 3.5–5.1)
Sodium: 133 mEq/L — ABNORMAL LOW (ref 135–145)
Total Bilirubin: 0.5 mg/dL (ref 0.2–1.2)
Total Protein: 7.6 g/dL (ref 6.0–8.3)

## 2022-11-07 LAB — POCT GLYCOSYLATED HEMOGLOBIN (HGB A1C): Hemoglobin A1C: 5.7 % — AB (ref 4.0–5.6)

## 2022-11-07 LAB — TSH: TSH: 1.92 u[IU]/mL (ref 0.35–5.50)

## 2022-11-07 NOTE — Patient Instructions (Signed)
Stop Niacin   Low potassium diet

## 2022-11-07 NOTE — Progress Notes (Signed)
Patient ID: [name] [name], male   DOB: [birth year], 77 y.o.   MRN: [id number]    Reason for Appointment: Endocrinology follow-up  History of Present Illness   HYPOTHYROIDISM, primary: This is long-standing and has been on levothyroxine supplementation  In 12/2017 his levothyroxine was increased from 125 up to 137 mcg daily Subsequently has had normal TSH levels  No unusual fatigue today, he was continued on the same dose of levothyroxine in 8/23 His weight tends to fluctuate  Labs pending  Lab Results  Component Value Date   TSH 3.46 05/06/2022   TSH 4.05 11/05/2021   TSH 1.12 05/07/2021   FREET4 1.15 05/06/2022   FREET4 1.17 11/05/2021   FREET4 1.29 05/07/2021    PROBLEM 2:   Diagnosis: Type 2 DIABETES MELITUS, date of diagnosis: 2009     Previous history: He has been treated with metformin monotherapy for several years with stable control Unable to exercise much because of chronic back pain  Recent history:   His A1c is again lower than expected at 5.7, previously 5.5   Oral hypoglycemic drugs:   metformin ER 1000 mg after dinner  Current management, blood sugar readings and problems identified:   He has likely been eating smaller portions with some weight loss since his last visit  As before his blood sugars are mostly near normal No side effects from metformin His renal function has been stable lately      Side effects from medications: None       Monitors blood glucose:  about once a day or less .    Glucometer:  One Touch        Blood Glucose readings from download:   PRE-MEAL Mornings Lunch Dinner Bedtime Overall  Glucose range: 92-96    92-133  Mean/median:     109   POST-MEAL PC Breakfast PC Lunch PC Dinner  Glucose range:  108-133 100-107  Mean/median:       Previous range 80-126 AVERAGE 103 with only 9 readings for the last month, mostly after lunch    Dietician visit: Most recent: 11/2008    Weight control:  Wt  Readings from Last 3 Encounters:  11/07/22 147 lb 3.2 oz (66.8 kg)  06/16/22 150 lb 6.4 oz (68.2 kg)  05/06/22 152 lb 6.4 oz (69.1 kg)   Diabetes labs:  Lab Results  Component Value Date   HGBA1C 5.7 (A) 11/07/2022   HGBA1C 5.5 05/06/2022   HGBA1C 5.6 11/05/2021   Lab Results  Component Value Date   MICROALBUR <0.7 05/06/2022   LDLCALC 40 05/06/2022   CREATININE 1.45 (H) 05/22/2022      Allergies as of 11/07/2022       Reactions   Sulfamethoxazole-trimethoprim Other (See Comments)   "Extreme discomfort"   Sulfa Antibiotics Rash   Niaspan [niacin Er] Hives        Medication List        Accurate as of November 07, 2022  9:51 AM. If you have any questions, ask your nurse or doctor.          amLODipine 5 MG tablet Commonly known as: NORVASC TAKE 1 TABLET BY MOUTH  DAILY   aspirin EC 81 MG tablet Take 81 mg by mouth daily.   atorvastatin 20 MG tablet Commonly known as: LIPITOR TAKE 1 TABLET BY MOUTH ONCE  DAILY   esomeprazole 40 MG capsule Commonly known as: NEXIUM SMARTSIG:1 By Mouth   fexofenadine 180 MG tablet Commonly  known as: ALLEGRA Take 180 mg by mouth daily.   folic acid A999333 MCG tablet Commonly known as: FOLVITE Take 800 mcg by mouth daily.   gabapentin 300 MG capsule Commonly known as: NEURONTIN Take 300 mg by mouth as directed.   levothyroxine 137 MCG tablet Commonly known as: SYNTHROID TAKE 1 TABLET BY MOUTH DAILY  BEFORE BREAKFAST   losartan 50 MG tablet Commonly known as: COZAAR Take 1 tablet (50 mg total) by mouth daily.   metFORMIN 500 MG 24 hr tablet Commonly known as: GLUCOPHAGE-XR TAKE 3 TABLETS BY MOUTH AT  BEDTIME   metoprolol succinate 25 MG 24 hr tablet Commonly known as: TOPROL-XL Take 1 tablet (25 mg total) by mouth daily.   metoprolol succinate 25 MG 24 hr tablet Commonly known as: TOPROL-XL TAKE 1 TABLET BY MOUTH  DAILY   multivitamin per tablet Take 1 tablet by mouth daily.   niacin 500 MG tablet Commonly  known as: VITAMIN B3 Take 1 tablet (500 mg total) by mouth at bedtime.   nitroGLYCERIN 0.4 MG SL tablet Commonly known as: NITROSTAT Place 1 tablet (0.4 mg total) under the tongue every 5 (five) minutes as needed for chest pain.   OneTouch Verio test strip Generic drug: glucose blood USE AS DIRECTED TO CHECK  BLOOD SUGAR ONCE DAILY   predniSONE 5 MG (21) Tbpk tablet Commonly known as: STERAPRED UNI-PAK 21 TAB Take dosepak as directed   traMADol 50 MG tablet Commonly known as: ULTRAM Take 50 mg by mouth every 6 (six) hours as needed for severe pain (leg pain).        Allergies:  Allergies  Allergen Reactions   Sulfamethoxazole-Trimethoprim Other (See Comments)    "Extreme discomfort"   Sulfa Antibiotics Rash   Niaspan [Niacin Er] Hives    Past Medical History:  Diagnosis Date   Allergy    AMI (acute mesenteric ischemia)    Arthritis, lumbar spine    Cataract    Colon polyps    Coronary artery disease 01/24/2011   S/p CABG // Myoview 07/2019: EF 51 (visually appears 55-60), no ischemia or infarction, Low Risk   Diabetes mellitus without complication (HCC)    Diverticulosis    GERD (gastroesophageal reflux disease)    Heart attack ([place]) 2004   Hemorrhoids    HTN (hypertension)    Hypothyroidism    Inguinal hernia    Lumbar scoliosis    Rectal bleeding     Past Surgical History:  Procedure Laterality Date   APPENDECTOMY  1966   by-pass     CATARACT EXTRACTION     Bil  /   11/2012   COLONOSCOPY     CORONARY ANGIOPLASTY WITH STENT PLACEMENT     CORONARY ARTERY BYPASS GRAFT  2004   x 5 / one stent   FEMUR IM NAIL Left 05/25/2014   Procedure: INTRAMEDULLARY (IM) NAIL FEMORAL WITH CABLES;  Surgeon: Meredith Pel, MD;  Location: [facility];  Service: Orthopedics;  Laterality: Left;   INGUINAL HERNIA REPAIR  1994   right side   lumbar laminotomy  10/2009   POLYPECTOMY      Family History  Problem Relation Age of Onset   Diabetes Mother    Heart disease  Father    Colon cancer Cousin     Social History:  reports that he quit smoking about 49 years ago. His smoking use included cigarettes. He has a 10.00 pack-year smoking history. He has never used smokeless tobacco. He reports that he  does not drink alcohol and does not use drugs.  Review of Systems:  Lipids: LDL has been previously at target of under 70 on a regimen of Lipitor 20 mg daily prescribed by his cardiologist.   Also has been taking niacin for low normal HDL    Lab Results  Component Value Date   CHOL 110 05/06/2022   HDL 44.60 05/06/2022   LDLCALC 40 05/06/2022   TRIG 124.0 05/06/2022   CHOLHDL 2 05/06/2022     Hypertension: On losartan 50 mg from cardiologist as also amlodipine Blood pressure higher in the office today but he says he did not sleep much last night  BP Readings from Last 3 Encounters:  11/07/22 (!) 144/78  06/16/22 120/70  05/06/22 110/64    Renal dysfunction: Generally stable, not related to diabetes He is interested in the nephrology consultation Also has been advised to reduce potassium intake  Microalbumin normal  Lab Results  Component Value Date   CREATININE 1.45 (H) 05/22/2022   CREATININE 1.53 (H) 05/06/2022   CREATININE 1.47 11/05/2021   Lab Results  Component Value Date   K 5.0 05/22/2022   K 5.2 (H) 05/06/2022   K 5.0 11/05/2021     Foot exam done in 8/23, has normal sensory exam  Eye exam 2022, negative    Examination:   BP (!) 144/78 (BP Location: Left Arm, Patient Position: Sitting, Cuff Size: Normal)   Pulse 63   Wt 147 lb 3.2 oz (66.8 kg)   SpO2 95%   BMI 22.38 kg/m   Body mass index is 22.38 kg/m.    ASSESSMENT/ PLAN:    Diabetes type 2, nonobese:  A1c is excellent at 5.7  Recent blood sugars are averaging 109 at home  He has been taking extended release metformin 1000 mg daily long-term with excellent control  Blood sugars are not high even after meals Because of his back problem he is not able  to do much formal exercise but his weight is the same or lower now He will periodically check his blood sugars and let us know if they are getting out of range   Hypothyroidism, primary and long-standing:   Subjectively doing well Taking his levothyroxine 137 mcg consistently every morning We will check his thyroid levels today  Chronic kidney disease, has been stable and will recheck labs today, he will request his PCP for nephrology consultation Discussed high potassium foods that he needs to restrict  Follow-up in 6 months  [name] [name] 11/07/2022, 9:51 AM

## 2022-12-23 ENCOUNTER — Other Ambulatory Visit: Payer: Self-pay | Admitting: Endocrinology

## 2022-12-23 ENCOUNTER — Encounter: Payer: Self-pay | Admitting: Endocrinology

## 2022-12-23 DIAGNOSIS — E875 Hyperkalemia: Secondary | ICD-10-CM

## 2022-12-24 ENCOUNTER — Other Ambulatory Visit (INDEPENDENT_AMBULATORY_CARE_PROVIDER_SITE_OTHER): Payer: Medicare Other

## 2022-12-24 ENCOUNTER — Encounter: Payer: Self-pay | Admitting: Endocrinology

## 2022-12-24 DIAGNOSIS — E875 Hyperkalemia: Secondary | ICD-10-CM | POA: Diagnosis not present

## 2022-12-24 LAB — BASIC METABOLIC PANEL
BUN: 14 mg/dL (ref 6–23)
CO2: 25 mEq/L (ref 19–32)
Calcium: 9.4 mg/dL (ref 8.4–10.5)
Chloride: 99 mEq/L (ref 96–112)
Creatinine, Ser: 1.36 mg/dL (ref 0.40–1.50)
GFR: 50.34 mL/min — ABNORMAL LOW (ref >60.00)
Glucose, Bld: 90 mg/dL (ref 70–99)
Potassium: 4.5 mEq/L (ref 3.5–5.1)
Sodium: 132 mEq/L — ABNORMAL LOW (ref 135–145)

## 2022-12-24 NOTE — Progress Notes (Signed)
Please call to let patient know that the potassium results are normal, sodium slightly low as before and kidney test slightly better

## 2022-12-25 ENCOUNTER — Encounter: Payer: Self-pay | Admitting: Cardiovascular Disease

## 2022-12-25 ENCOUNTER — Encounter: Payer: Self-pay | Admitting: Endocrinology

## 2022-12-26 ENCOUNTER — Encounter: Payer: Self-pay | Admitting: Cardiovascular Disease

## 2023-01-06 ENCOUNTER — Other Ambulatory Visit: Payer: Self-pay | Admitting: Endocrinology

## 2023-01-12 ENCOUNTER — Encounter: Payer: Self-pay | Admitting: Endocrinology

## 2023-01-17 ENCOUNTER — Other Ambulatory Visit: Payer: Self-pay | Admitting: Cardiovascular Disease

## 2023-01-29 DIAGNOSIS — H1789 Other corneal scars and opacities: Secondary | ICD-10-CM | POA: Diagnosis not present

## 2023-01-29 DIAGNOSIS — H52203 Unspecified astigmatism, bilateral: Secondary | ICD-10-CM | POA: Diagnosis not present

## 2023-01-29 DIAGNOSIS — Z961 Presence of intraocular lens: Secondary | ICD-10-CM | POA: Diagnosis not present

## 2023-01-29 DIAGNOSIS — E119 Type 2 diabetes mellitus without complications: Secondary | ICD-10-CM | POA: Diagnosis not present

## 2023-01-29 LAB — HM DIABETES EYE EXAM

## 2023-01-30 ENCOUNTER — Encounter: Payer: Self-pay | Admitting: Endocrinology

## 2023-02-26 DIAGNOSIS — L821 Other seborrheic keratosis: Secondary | ICD-10-CM | POA: Diagnosis not present

## 2023-02-26 DIAGNOSIS — D1801 Hemangioma of skin and subcutaneous tissue: Secondary | ICD-10-CM | POA: Diagnosis not present

## 2023-02-26 DIAGNOSIS — L814 Other melanin hyperpigmentation: Secondary | ICD-10-CM | POA: Diagnosis not present

## 2023-02-26 DIAGNOSIS — D225 Melanocytic nevi of trunk: Secondary | ICD-10-CM | POA: Diagnosis not present

## 2023-02-26 DIAGNOSIS — Z85828 Personal history of other malignant neoplasm of skin: Secondary | ICD-10-CM | POA: Diagnosis not present

## 2023-02-26 DIAGNOSIS — D692 Other nonthrombocytopenic purpura: Secondary | ICD-10-CM | POA: Diagnosis not present

## 2023-02-26 DIAGNOSIS — L720 Epidermal cyst: Secondary | ICD-10-CM | POA: Diagnosis not present

## 2023-03-09 DIAGNOSIS — D649 Anemia, unspecified: Secondary | ICD-10-CM | POA: Diagnosis not present

## 2023-03-09 DIAGNOSIS — M1990 Unspecified osteoarthritis, unspecified site: Secondary | ICD-10-CM | POA: Diagnosis not present

## 2023-03-09 DIAGNOSIS — E039 Hypothyroidism, unspecified: Secondary | ICD-10-CM | POA: Diagnosis not present

## 2023-03-09 DIAGNOSIS — I25709 Atherosclerosis of coronary artery bypass graft(s), unspecified, with unspecified angina pectoris: Secondary | ICD-10-CM | POA: Diagnosis not present

## 2023-03-09 DIAGNOSIS — E1122 Type 2 diabetes mellitus with diabetic chronic kidney disease: Secondary | ICD-10-CM | POA: Diagnosis not present

## 2023-03-09 DIAGNOSIS — I10 Essential (primary) hypertension: Secondary | ICD-10-CM | POA: Diagnosis not present

## 2023-03-09 DIAGNOSIS — Z0000 Encounter for general adult medical examination without abnormal findings: Secondary | ICD-10-CM | POA: Diagnosis not present

## 2023-03-09 DIAGNOSIS — N1831 Chronic kidney disease, stage 3a: Secondary | ICD-10-CM | POA: Diagnosis not present

## 2023-03-10 LAB — LAB REPORT - SCANNED: EGFR: 56

## 2023-03-19 ENCOUNTER — Other Ambulatory Visit: Payer: Self-pay | Admitting: Family Medicine

## 2023-03-19 ENCOUNTER — Ambulatory Visit
Admission: RE | Admit: 2023-03-19 | Discharge: 2023-03-19 | Disposition: A | Discharge status: Home / Self Care | Payer: Medicare Other | Source: Ambulatory Visit | Attending: Family Medicine | Admitting: Family Medicine

## 2023-03-19 DIAGNOSIS — M19012 Primary osteoarthritis, left shoulder: Secondary | ICD-10-CM | POA: Diagnosis not present

## 2023-03-19 DIAGNOSIS — M25512 Pain in left shoulder: Secondary | ICD-10-CM

## 2023-03-19 DIAGNOSIS — M19011 Primary osteoarthritis, right shoulder: Secondary | ICD-10-CM | POA: Diagnosis not present

## 2023-03-19 DIAGNOSIS — M47812 Spondylosis without myelopathy or radiculopathy, cervical region: Secondary | ICD-10-CM | POA: Diagnosis not present

## 2023-03-19 DIAGNOSIS — M19019 Primary osteoarthritis, unspecified shoulder: Secondary | ICD-10-CM

## 2023-03-19 DIAGNOSIS — M25511 Pain in right shoulder: Secondary | ICD-10-CM

## 2023-04-17 ENCOUNTER — Other Ambulatory Visit: Payer: Self-pay

## 2023-04-17 DIAGNOSIS — E119 Type 2 diabetes mellitus without complications: Secondary | ICD-10-CM

## 2023-05-05 ENCOUNTER — Encounter: Payer: Self-pay | Admitting: Endocrinology

## 2023-05-05 ENCOUNTER — Ambulatory Visit: Payer: Medicare Other | Admitting: Endocrinology

## 2023-05-05 ENCOUNTER — Other Ambulatory Visit: Payer: Self-pay

## 2023-05-05 VITALS — BP 134/80 | HR 69 | Ht 68.0 in | Wt 143.4 lb

## 2023-05-05 DIAGNOSIS — Z7984 Long term (current) use of oral hypoglycemic drugs: Secondary | ICD-10-CM

## 2023-05-05 DIAGNOSIS — E063 Autoimmune thyroiditis: Secondary | ICD-10-CM

## 2023-05-05 DIAGNOSIS — E119 Type 2 diabetes mellitus without complications: Secondary | ICD-10-CM | POA: Diagnosis not present

## 2023-05-05 LAB — POCT GLYCOSYLATED HEMOGLOBIN (HGB A1C): Hemoglobin A1C: 5.6 % (ref 4.0–5.6)

## 2023-05-05 LAB — MICROALBUMIN / CREATININE URINE RATIO
Creatinine,U: 24.6 mg/dL
Microalb Creat Ratio: 2.9 mg/g (ref 0.0–30.0)
Microalb, Ur: <0.7 mg/dL (ref 0.0–1.9)

## 2023-05-05 LAB — T4, FREE: Free T4: 1.77 ng/dL — ABNORMAL HIGH (ref 0.60–1.60)

## 2023-05-05 LAB — TSH: TSH: 0.34 u[IU]/mL — ABNORMAL LOW (ref 0.35–5.50)

## 2023-05-05 MED ORDER — LEVOTHYROXINE SODIUM 137 MCG PO TABS
137.0000 ug | ORAL_TABLET | Freq: Every day | ORAL | 2 refills | Status: AC
Start: 2023-05-05 — End: ?

## 2023-05-05 MED ORDER — LEVOTHYROXINE SODIUM 137 MCG PO TABS: 137.0000 ug | ORAL_TABLET | Freq: Every day | ORAL | 2 refills | Status: DC

## 2023-05-05 NOTE — Progress Notes (Signed)
Patient ID: [name] [name], male   DOB: 10-07-1945, 77 y.o.   MRN: [id number]    Reason for Appointment: Endocrinology follow-up  History of Present Illness   HYPOTHYROIDISM, primary: This is long-standing and has been on levothyroxine supplementation  In 12/2017 his levothyroxine was increased from 125 up to 137 mcg daily Subsequently has had normal TSH levels on the same dose  He complains of no fatigue and is generally trying to be active He says he is taking his levothyroxine consistently fasting in the morning before eating  Thyroid levels pending  Lab Results  Component Value Date   TSH 1.92 11/07/2022   TSH 3.46 05/06/2022   TSH 4.05 11/05/2021   FREET4 1.15 05/06/2022   FREET4 1.17 11/05/2021   FREET4 1.29 05/07/2021    PROBLEM 2:   Diagnosis: Type 2 DIABETES MELITUS, date of diagnosis: 2009     Previous history: He has been treated with metformin monotherapy for several years with stable control Unable to exercise much because of chronic back pain  Recent history:   His A1c is consistently below 6% and now 5.6  Oral hypoglycemic drugs:   metformin ER 1500 mg after dinner  Current management, blood sugar readings and problems identified:   His metformin has been previously reduced because of renal dysfunction but since this is improved he is taking 1500 mg a day now No side effects from this  He says he is cutting back on snacks and sweets and has lost weight compared to last year He is not doing any formal exercise but generally trying to be active around his home Blood sugars are excellent at home with some readings at different times      Side effects from medications: None       Monitors blood glucose:  about once a day or less .    Glucometer:  One Touch        Blood Glucose readings from download:   PRE-MEAL Fasting Lunch Dinner Bedtime Overall  Glucose range: 89, 106    89-1 29  Mean/median:     108   POST-MEAL PC Breakfast PC  Lunch PC Dinner  Glucose range:   117, 129  Mean/median:       Previously  PRE-MEAL Mornings Lunch Dinner Bedtime Overall  Glucose range: 92-96    92-133  Mean/median:     109   POST-MEAL PC Breakfast PC Lunch PC Dinner  Glucose range:  108-133 100-107  Mean/median:       Dietician visit: Most recent: 11/2008    Weight control:  Wt Readings from Last 3 Encounters:  05/05/23 143 lb 6.4 oz (65 kg)  11/07/22 147 lb 3.2 oz (66.8 kg)  06/16/22 150 lb 6.4 oz (68.2 kg)   Diabetes labs:  Lab Results  Component Value Date   HGBA1C 5.6 05/05/2023   HGBA1C 5.7 (A) 11/07/2022   HGBA1C 5.5 05/06/2022   Lab Results  Component Value Date   MICROALBUR <0.7 05/06/2022   LDLCALC 40 05/06/2022   CREATININE 1.36 12/24/2022      Allergies as of 05/05/2023       Reactions   Sulfamethoxazole-trimethoprim Other (See Comments)   "Extreme discomfort"   Sulfa Antibiotics Rash   Niaspan [niacin Er] Hives        Medication List        Accurate as of May 05, 2023  8:40 AM. If you have any questions, ask your nurse or doctor.  STOP taking these medications    predniSONE 5 MG (21) Tbpk tablet Commonly known as: STERAPRED UNI-PAK 21 TAB Stopped by: Reather Littler       TAKE these medications    amLODipine 5 MG tablet Commonly known as: NORVASC TAKE 1 TABLET BY MOUTH DAILY   aspirin EC 81 MG tablet Take 81 mg by mouth daily.   atorvastatin 20 MG tablet Commonly known as: LIPITOR TAKE 1 TABLET BY MOUTH ONCE  DAILY   esomeprazole 40 MG capsule Commonly known as: NEXIUM SMARTSIG:1 By Mouth   fexofenadine 180 MG tablet Commonly known as: ALLEGRA Take 180 mg by mouth daily.   folic acid 400 MCG tablet Commonly known as: FOLVITE Take 800 mcg by mouth daily.   gabapentin 300 MG capsule Commonly known as: NEURONTIN Take 300 mg by mouth as directed.   levothyroxine 137 MCG tablet Commonly known as: SYNTHROID TAKE 1 TABLET BY MOUTH DAILY  BEFORE  BREAKFAST   losartan 50 MG tablet Commonly known as: COZAAR Take 1 tablet (50 mg total) by mouth daily.   metFORMIN 500 MG 24 hr tablet Commonly known as: GLUCOPHAGE-XR TAKE 3 TABLETS BY MOUTH AT  BEDTIME   metoprolol succinate 25 MG 24 hr tablet Commonly known as: TOPROL-XL Take 1 tablet (25 mg total) by mouth daily.   metoprolol succinate 25 MG 24 hr tablet Commonly known as: TOPROL-XL TAKE 1 TABLET BY MOUTH ONCE  DAILY   multivitamin per tablet Take 1 tablet by mouth daily.   nitroGLYCERIN 0.4 MG SL tablet Commonly known as: NITROSTAT Place 1 tablet (0.4 mg total) under the tongue every 5 (five) minutes as needed for chest pain.   OneTouch Verio test strip Generic drug: glucose blood USE AS DIRECTED TO CHECK  BLOOD SUGAR ONCE DAILY   traMADol 50 MG tablet Commonly known as: ULTRAM Take 50 mg by mouth every 6 (six) hours as needed for severe pain (leg pain).        Allergies:  Allergies  Allergen Reactions   Sulfamethoxazole-Trimethoprim Other (See Comments)    "Extreme discomfort"   Sulfa Antibiotics Rash   Niaspan [Niacin Er] Hives    Past Medical History:  Diagnosis Date   Allergy    AMI (acute mesenteric ischemia)    Arthritis, lumbar spine    Cataract    Colon polyps    Coronary artery disease 01/24/2011   S/p CABG // Myoview 07/2019: EF 51 (visually appears 55-60), no ischemia or infarction, Low Risk   Diabetes mellitus without complication (HCC)    Diverticulosis    GERD (gastroesophageal reflux disease)    Heart attack (HCC) 2004   Hemorrhoids    HTN (hypertension)    Hypothyroidism    Inguinal hernia    Lumbar scoliosis    Rectal bleeding     Past Surgical History:  Procedure Laterality Date   APPENDECTOMY  1966   by-pass     CATARACT EXTRACTION     Bil  /   11/2012   COLONOSCOPY     CORONARY ANGIOPLASTY WITH STENT PLACEMENT     CORONARY ARTERY BYPASS GRAFT  2004   x 5 / one stent   FEMUR IM NAIL Left 05/25/2014   Procedure:  INTRAMEDULLARY (IM) NAIL FEMORAL WITH CABLES;  Surgeon: Cammy Copa, MD;  Location: [facility];  Service: Orthopedics;  Laterality: Left;   INGUINAL HERNIA REPAIR  1994   right side   lumbar laminotomy  10/2009   POLYPECTOMY  Family History  Problem Relation Age of Onset   Diabetes Mother    Heart disease Father    Colon cancer Cousin     Social History:  reports that he quit smoking about 49 years ago. His smoking use included cigarettes. He started smoking about 59 years ago. He has a 10 pack-year smoking history. He has never used smokeless tobacco. He reports that he does not drink alcohol and does not use drugs.  Review of Systems:  Lipids: LDL has been previously at target of under 70 on a regimen of Lipitor 20 mg daily prescribed by his cardiologist.   Also has been taking niacin for low normal HDL    Lab Results  Component Value Date   CHOL 110 05/06/2022   HDL 44.60 05/06/2022   LDLCALC 40 05/06/2022   TRIG 124.0 05/06/2022   CHOLHDL 2 05/06/2022     Hypertension: On losartan 50 mg from cardiologist as also amlodipine  BP Readings from Last 3 Encounters:  05/05/23 134/80  11/07/22 (!) 144/78  06/16/22 120/70    Renal dysfunction: Recently stable, not related to diabetes, recent creatinine 1.3 done by PCP No recurrence of hyperkalemia  Microalbumin pending  Lab Results  Component Value Date   CREATININE 1.36 12/24/2022   CREATININE 1.43 11/07/2022   CREATININE 1.45 (H) 05/22/2022   Lab Results  Component Value Date   K 4.5 12/24/2022   K 5.4 No hemolysis seen (H) 11/07/2022   K 5.0 05/22/2022     Foot exam done in 8/23, has normal sensory exam  Eye exam 01/2023, negative    Examination:   BP 134/80   Pulse 69   Ht 5\' 8"  (1.727 m)   Wt 143 lb 6.4 oz (65 kg)   SpO2 97%   BMI 21.80 kg/m   Body mass index is 21.8 kg/m.   Thyroid not palpable No tremor No pedal edema  ASSESSMENT/ PLAN:    Diabetes type 2, nonobese:  A1c is  excellent at 5.6 Taking metformin alone as before, 1500 mg daily without side effect  He has cut back on snacks and sweets in his blood sugars are still excellent at home Overall has been no progression in diabetes with taking metformin low He will continue the same regimen Continued check his blood sugars at various times by rotation  Follow-up in 6 months   Hypothyroidism, primary and long-standing:   No symptoms of fatigue Has been taking levothyroxine 137 mcg consistently every morning We will check his thyroid levels today and adjust medication if needed  Chronic kidney disease, mild and has been stable and followed by PCP  Follow-up in 6 months  Mairin Lindsley 05/05/2023, 8:40 AM

## 2023-05-06 ENCOUNTER — Encounter: Payer: Self-pay | Admitting: Endocrinology

## 2023-05-06 NOTE — Progress Notes (Signed)
Call: Thyroid levels are likely being interfered with by biotin vitamin.  Needs to check if he is taking this and come back in 1 month after stopping biotin for at least a week for recheck

## 2023-05-23 DIAGNOSIS — Z23 Encounter for immunization: Secondary | ICD-10-CM | POA: Diagnosis not present

## 2023-06-03 ENCOUNTER — Other Ambulatory Visit: Payer: Self-pay | Admitting: Cardiovascular Disease

## 2023-06-03 DIAGNOSIS — I10 Essential (primary) hypertension: Secondary | ICD-10-CM

## 2023-06-08 ENCOUNTER — Other Ambulatory Visit: Payer: Self-pay

## 2023-06-08 DIAGNOSIS — E119 Type 2 diabetes mellitus without complications: Secondary | ICD-10-CM

## 2023-06-08 MED ORDER — LEVOTHYROXINE SODIUM 137 MCG PO TABS: 137.0000 ug | ORAL_TABLET | Freq: Every day | ORAL | 0 refills | Status: DC

## 2023-06-16 ENCOUNTER — Other Ambulatory Visit: Payer: Self-pay

## 2023-06-16 ENCOUNTER — Other Ambulatory Visit: Payer: Self-pay | Admitting: Endocrinology

## 2023-06-16 ENCOUNTER — Other Ambulatory Visit: Payer: Medicare Other

## 2023-06-16 DIAGNOSIS — E063 Autoimmune thyroiditis: Secondary | ICD-10-CM

## 2023-06-16 DIAGNOSIS — Z8639 Personal history of other endocrine, nutritional and metabolic disease: Secondary | ICD-10-CM

## 2023-06-16 DIAGNOSIS — E782 Mixed hyperlipidemia: Secondary | ICD-10-CM

## 2023-06-17 ENCOUNTER — Encounter: Payer: Self-pay | Admitting: Cardiovascular Disease

## 2023-06-17 ENCOUNTER — Other Ambulatory Visit: Payer: Self-pay

## 2023-06-17 ENCOUNTER — Encounter: Payer: Self-pay | Admitting: Endocrinology

## 2023-06-17 DIAGNOSIS — E119 Type 2 diabetes mellitus without complications: Secondary | ICD-10-CM

## 2023-06-17 MED ORDER — LEVOTHYROXINE SODIUM 137 MCG PO TABS: 137.0000 ug | ORAL_TABLET | Freq: Every day | ORAL | 0 refills | Status: DC

## 2023-06-17 MED ORDER — LEVOTHYROXINE SODIUM 137 MCG PO TABS
137.0000 ug | ORAL_TABLET | Freq: Every day | ORAL | 0 refills | Status: DC
Start: 2023-06-17 — End: 2023-06-17

## 2023-06-17 MED ORDER — ATORVASTATIN CALCIUM 20 MG PO TABS: 20.0000 mg | ORAL_TABLET | Freq: Every day | ORAL | 0 refills | Status: DC

## 2023-06-17 NOTE — Telephone Encounter (Signed)
  Patient left home without his  Atorvastatin 20 mg.  Sent 12 pills to  [facility] [address] [place] , [place] [address] [phone]

## 2023-07-17 ENCOUNTER — Encounter: Payer: Self-pay | Admitting: Cardiovascular Disease

## 2023-07-17 ENCOUNTER — Ambulatory Visit: Payer: Medicare Other | Attending: Cardiovascular Disease | Admitting: Cardiovascular Disease

## 2023-07-17 VITALS — BP 102/64 | HR 76 | Ht 68.0 in | Wt 146.4 lb

## 2023-07-17 DIAGNOSIS — I10 Essential (primary) hypertension: Secondary | ICD-10-CM

## 2023-07-17 DIAGNOSIS — E119 Type 2 diabetes mellitus without complications: Secondary | ICD-10-CM

## 2023-07-17 DIAGNOSIS — I2510 Atherosclerotic heart disease of native coronary artery without angina pectoris: Secondary | ICD-10-CM | POA: Diagnosis not present

## 2023-07-17 DIAGNOSIS — I498 Other specified cardiac arrhythmias: Secondary | ICD-10-CM

## 2023-07-17 DIAGNOSIS — N1831 Chronic kidney disease, stage 3a: Secondary | ICD-10-CM | POA: Diagnosis not present

## 2023-07-17 DIAGNOSIS — E782 Mixed hyperlipidemia: Secondary | ICD-10-CM

## 2023-07-17 NOTE — Assessment & Plan Note (Signed)
Lipids are at goal on atorvastatin 20 mg.  His cholesterol is 102, LDL 35

## 2023-07-17 NOTE — Assessment & Plan Note (Signed)
Blood pressure is well-controlled.  He states that his office blood pressure today is lower than normal.  I repeated his blood pressure and got a reading of 127/70.  He will continue on a combination of amlodipine, losartan, and metoprolol succinate.

## 2023-07-17 NOTE — Patient Instructions (Signed)

## 2023-07-17 NOTE — Progress Notes (Signed)
Cardiology Office Note:    Date:  07/17/2023   ID:  [name] [name], [name] [name] Feb 27, 1946, MRN [id number]  PCP:  Irven Coe, MD   [facility] Providers Cardiologist:  Tonny Bollman, MD     Referring MD: Irven Coe, MD   Chief Complaint  Patient presents with   Coronary Artery Disease    History of Present Illness:    [name] [name] is a 77 y.o. male with a hx of:  Coronary artery disease  S/p CABG 2004 LIMA sequential to diagonal and LAD Left radial to first OM SVG to distal circumflex RIMA to PDA Atrial bigeminy  Hyperlipidemia  Hypertension  Diabetes mellitus 2 GERD Hypothyroidism  The patient is here alone today.  He continues to have problems with arthritis, but he otherwise is doing quite well.  He denies any cardiac-related symptoms. Today, he denies symptoms of palpitations, chest pain, shortness of breath, orthopnea, PND, lower extremity edema, dizziness, or syncope.  He compliant with his medications.  He notes that Dr. Lucianne Muss is retiring and he will need a new endocrinologist.  He is scheduled to see someone in February.  Current Medications: Current Meds  Medication Sig   amLODipine (NORVASC) 5 MG tablet TAKE 1 TABLET BY MOUTH DAILY   aspirin EC 81 MG tablet Take 81 mg by mouth daily.   atorvastatin (LIPITOR) 20 MG tablet Take 1 tablet (20 mg total) by mouth daily.   esomeprazole (NEXIUM) 40 MG capsule SMARTSIG:1 By Mouth   folic acid (FOLVITE) 400 MCG tablet Take 800 mcg by mouth daily.   gabapentin (NEURONTIN) 300 MG capsule Take 300 mg by mouth as directed.    levothyroxine (SYNTHROID) 137 MCG tablet Take 1 tablet (137 mcg total) by mouth daily before breakfast.   losartan (COZAAR) 50 MG tablet TAKE 1 TABLET BY MOUTH DAILY   metFORMIN (GLUCOPHAGE-XR) 500 MG 24 hr tablet TAKE 3 TABLETS BY MOUTH AT  BEDTIME   metoprolol succinate (TOPROL-XL) 25 MG 24 hr tablet Take 1 tablet (25 mg total) by mouth daily.   metoprolol succinate (TOPROL-XL) 25 MG 24 hr  tablet TAKE 1 TABLET BY MOUTH ONCE  DAILY   ONETOUCH VERIO test strip USE AS DIRECTED TO CHECK  BLOOD SUGAR ONCE DAILY   traMADol (ULTRAM) 50 MG tablet Take 50 mg by mouth every 6 (six) hours as needed for severe pain (leg pain).     Allergies:   Sulfamethoxazole-trimethoprim, Sulfa antibiotics, and Niaspan [niacin er (antihyperlipidemic)]   ROS:   Please see the history of present illness.    All other systems reviewed and are negative.  EKGs/Labs/Other Studies Reviewed:    The following studies were reviewed today: Cardiac Studies & Procedures     STRESS TESTS  MYOCARDIAL PERFUSION IMAGING 07/12/2019  Narrative  Nuclear stress EF: 51%. Visually, the EF appears to be be greater than the 51% calculated by the computer. The EF appears to be 55-60% visually.  There was no ST segment deviation noted during stress.  This is a low risk study. There is no evidence of ischemia or previous infarction.  The study is normal.     MONITORS  CARDIAC EVENT MONITOR 08/12/2017  Narrative Sinus rhythm with single PVC's present           EKG:   EKG Interpretation Date/Time:  Friday July 17 2023 08:30:46 EST Ventricular Rate:  76 PR Interval:  130 QRS Duration:  86 QT Interval:  346 QTC Calculation: 389 R Axis:   91  Text Interpretation: Sinus rhythm with marked sinus arrhythmia Rightward axis Nonspecific ST and T wave abnormality Confirmed by Tonny Bollman [phone]) on 07/17/2023 12:59:14 PM    Recent Labs: 11/07/2022: ALT 20 12/24/2022: BUN 14; Creatinine, Ser 1.36; Potassium 4.5; Sodium 132 05/05/2023: TSH 0.34  Recent Lipid Panel    Component Value Date/Time   CHOL 110 05/06/2022 0833   TRIG 124.0 05/06/2022 0833   HDL 44.60 05/06/2022 0833   CHOLHDL 2 05/06/2022 0833   VLDL 24.8 05/06/2022 0833   LDLCALC 40 05/06/2022 0833     Risk Assessment/Calculations:                Physical Exam:    VS:  BP 102/64   Pulse 76   Ht 5\' 8"  (1.727 m)   Wt 146 lb 6.4 oz  (66.4 kg)   SpO2 97%   BMI 22.26 kg/m     Wt Readings from Last 3 Encounters:  07/17/23 146 lb 6.4 oz (66.4 kg)  05/05/23 143 lb 6.4 oz (65 kg)  11/07/22 147 lb 3.2 oz (66.8 kg)     GEN:  Well nourished, well developed elderly male in no acute distress HEENT: Normal NECK: No JVD; No carotid bruits LYMPHATICS: No lymphadenopathy CARDIAC: RRR, no murmurs, rubs, gallops, with intermittent early beats RESPIRATORY:  Clear to auscultation without rales, wheezing or rhonchi  ABDOMEN: Soft, non-tender, non-distended MUSCULOSKELETAL:  No edema; No deformity  SKIN: Warm and dry NEUROLOGIC:  Alert and oriented x 3 PSYCHIATRIC:  Normal affect   Assessment & Plan Coronary artery disease involving native coronary artery of native heart without angina pectoris The patient is doing well and will continue on aspirin for antiplatelet therapy, atorvastatin, and metoprolol succinate. Mixed hyperlipidemia Lipids are at goal on atorvastatin 20 mg.  His cholesterol is 102, LDL 35 Essential hypertension Blood pressure is well-controlled.  He states that his office blood pressure today is lower than normal.  I repeated his blood pressure and got a reading of 127/70.  He will continue on a combination of amlodipine, losartan, and metoprolol succinate. Stage 3a chronic kidney disease (HCC) Labs reviewed most recent creatinine is 1.36.  Appropriately treated with losartan. Type 2 diabetes mellitus without complication, without long-term current use of insulin (HCC) Followed by endocrinology.  His diabetes has been very well-controlled over time. Atrial bigeminy He has frequent atrial ectopy and is completely asymptomatic.  This has been longstanding for him.  He is treated with a beta-blocker.      Medication Adjustments/Labs and Tests Ordered: Current medicines are reviewed at length with the patient today.  Concerns regarding medicines are outlined above.  Orders Placed This Encounter  Procedures    EKG 12-Lead   No orders of the defined types were placed in this encounter.   Patient Instructions  Follow-Up: At [facility], you and your health needs are our priority.  As part of our continuing mission to provide you with exceptional heart care, we have created designated Provider Care Teams.  These Care Teams include your primary Cardiologist (physician) and Advanced Practice Providers (APPs -  Physician Assistants and Nurse Practitioners) who all work together to provide you with the care you need, when you need it.  Your next appointment:   1 year(s)  Provider:   Tonny Bollman, MD        Signed, Tonny Bollman, MD  07/17/2023 12:59 PM    [facility]

## 2023-07-17 NOTE — Assessment & Plan Note (Signed)
The patient is doing well and will continue on aspirin for antiplatelet therapy, atorvastatin, and metoprolol succinate.

## 2023-07-17 NOTE — Assessment & Plan Note (Signed)
Followed by endocrinology.  His diabetes has been very well-controlled over time.

## 2023-07-29 DIAGNOSIS — H527 Unspecified disorder of refraction: Secondary | ICD-10-CM | POA: Diagnosis not present

## 2023-08-04 ENCOUNTER — Other Ambulatory Visit: Payer: Self-pay | Admitting: Endocrinology

## 2023-08-04 ENCOUNTER — Other Ambulatory Visit: Payer: Self-pay | Admitting: Cardiovascular Disease

## 2023-08-04 DIAGNOSIS — E119 Type 2 diabetes mellitus without complications: Secondary | ICD-10-CM

## 2023-08-17 ENCOUNTER — Other Ambulatory Visit: Payer: Self-pay | Admitting: Cardiovascular Disease

## 2023-08-17 ENCOUNTER — Encounter: Payer: Self-pay | Admitting: Endocrinology

## 2023-08-17 ENCOUNTER — Other Ambulatory Visit: Payer: Self-pay

## 2023-08-17 DIAGNOSIS — I10 Essential (primary) hypertension: Secondary | ICD-10-CM

## 2023-08-17 DIAGNOSIS — E119 Type 2 diabetes mellitus without complications: Secondary | ICD-10-CM

## 2023-08-17 MED ORDER — METFORMIN HCL ER 500 MG PO TB24: 1500.0000 mg | ORAL_TABLET | Freq: Every day | ORAL | 2 refills | Status: DC

## 2023-11-05 ENCOUNTER — Encounter: Payer: Self-pay | Admitting: Endocrinology

## 2023-11-05 ENCOUNTER — Other Ambulatory Visit: Payer: Self-pay

## 2023-11-05 ENCOUNTER — Ambulatory Visit: Payer: Medicare Other | Admitting: Endocrinology

## 2023-11-05 VITALS — BP 118/72 | HR 40 | Resp 20 | Ht 68.0 in | Wt 146.4 lb

## 2023-11-05 DIAGNOSIS — E063 Autoimmune thyroiditis: Secondary | ICD-10-CM | POA: Diagnosis not present

## 2023-11-05 DIAGNOSIS — E119 Type 2 diabetes mellitus without complications: Secondary | ICD-10-CM | POA: Diagnosis not present

## 2023-11-05 DIAGNOSIS — E782 Mixed hyperlipidemia: Secondary | ICD-10-CM | POA: Diagnosis not present

## 2023-11-05 LAB — POCT GLYCOSYLATED HEMOGLOBIN (HGB A1C): Hemoglobin A1C: 5.4 % (ref 4.0–5.6)

## 2023-11-05 NOTE — Progress Notes (Signed)
 Patient heart rate reading and staying at 40 bpm. Per patient he is asymptomatic, no dizziness or lightheadedness. Patient is on beta blocker .MD made aware.

## 2023-11-05 NOTE — Progress Notes (Signed)
 Outpatient Endocrinology Note [place] Kearney Evitt, MD   Patient's Name: [name] [name]    DOB: July 05, 1946    MRN: [id number]                                                    REASON OF VISIT: Follow up for type 2 diabetes mellitus and hypothyroidism.  PCP: Irven Coe, MD  HISTORY OF PRESENT ILLNESS:   [name] [name] is a 78 y.o. old male with past medical history listed below, is here for follow up for type 2 diabetes mellitus and hypothyroidism.   Pertinent Diabetes History: Patient was previously seen by Dr. Lucianne Muss and was last time seen in August 2024. Patient was diagnosed with type 2 diabetes mellitus in 2009.  He has controlled type 2 diabetes mellitus on metformin monotherapy.  Chronic Diabetes Complications : Retinopathy: no. Last ophthalmology exam was done on annually, following with ophthalmology regularly.  Nephropathy:CKD IIIa, on ACE/ARB /losartan. Peripheral neuropathy: no Coronary artery disease: on Stroke: no  Relevant comorbidities and cardiovascular risk factors: Obesity: no Body mass index is 22.26 kg/m.  Hypertension: Yes  Hyperlipidemia : Yes, on statin   Current / Home Diabetic regimen includes:  Metformin extended release 1500 mg at bedtime.  Prior diabetic medications: None  Glycemic data:    He has One Touch Verio glucometer, checking blood sugar occasionally.  In last 2 weeks he had checked once blood sugar 119.  Hypoglycemia: Patient has no hypoglycemic episodes. Patient has hypoglycemia awareness.  Factors modifying glucose control: 1.  Diabetic diet assessment: 3 meals a day.  2.  Staying active or exercising: Not able to exercise due to back pain.  3.  Medication compliance: compliant all of the time.  # Primary hypothyroidism : -Patient has longstanding hypothyroidism primary.  Levothyroxine dose was increased from 125 to 137 mcg daily in April 2019.  Lately he has been on a stable dose of levothyroxine 137 mcg daily.   Interval  history  Patient presented for every 58-month follow-up.  Hemoglobin A1c today 5.4%.  He denies any side effect, GI issues related to metformin.  He denies palpitation or heat intolerance.  Overall feeling good.  He has been taking levothyroxine 137 mcg daily in the morning 30 minutes before breakfast.  In the August visit he had abnormal thyroid function test with a low TSH and elevated free T4, there was plan to repeat after holding multivitamin to rule out biotin interference, repeat thyroid function test has not been completed after August, he reports he came to the lab visit however could not complete the lab during September time after holding multivitamin.  Pulse ox is measuring heart rate of 40 bpm.  However I auscultated heart , heart rate is 70 bpm.  No lightheadedness, dizziness or weakness.  He has been on metoprolol for blood pressure control however he has not taken today.  REVIEW OF SYSTEMS As per history of present illness.   PAST MEDICAL HISTORY: Past Medical History:  Diagnosis Date   Allergy    AMI (acute mesenteric ischemia)    Arthritis, lumbar spine    Cataract    Colon polyps    Coronary artery disease 01/24/2011   S/p CABG // Myoview 07/2019: EF 51 (visually appears 55-60), no ischemia or infarction, Low Risk   Diabetes mellitus without complication (HCC)  Diverticulosis    GERD (gastroesophageal reflux disease)    Heart attack (HCC) 2004   Hemorrhoids    HTN (hypertension)    Hypothyroidism    Inguinal hernia    Lumbar scoliosis    Rectal bleeding     PAST SURGICAL HISTORY: Past Surgical History:  Procedure Laterality Date   APPENDECTOMY  1966   by-pass     CATARACT EXTRACTION     Bil  /   11/2012   COLONOSCOPY     CORONARY ANGIOPLASTY WITH STENT PLACEMENT     CORONARY ARTERY BYPASS GRAFT  2004   x 5 / one stent   FEMUR IM NAIL Left 05/25/2014   Procedure: INTRAMEDULLARY (IM) NAIL FEMORAL WITH CABLES;  Surgeon: Cammy Copa, MD;  Location: [facility]  OR;  Service: Orthopedics;  Laterality: Left;   INGUINAL HERNIA REPAIR  1994   right side   lumbar laminotomy  10/2009   POLYPECTOMY      ALLERGIES: Allergies  Allergen Reactions   Sulfamethoxazole-Trimethoprim Other (See Comments)    "Extreme discomfort"   Sulfa Antibiotics Rash   Niaspan [Niacin Er (Antihyperlipidemic)] Hives    FAMILY HISTORY:  Family History  Problem Relation Age of Onset   Diabetes Mother    Heart disease Father    Colon cancer Cousin     SOCIAL HISTORY: Social History   Socioeconomic History   Marital status: Married    Spouse name: Not on file   Number of children: Not on file   Years of education: Not on file   Highest education level: Not on file  Occupational History   Not on file  Tobacco Use   Smoking status: Former    Current packs/day: 0.00    Average packs/day: 1 pack/day for 10.0 years (10.0 ttl pk-yrs)    Types: Cigarettes    Start date: 09/09/1963    Quit date: 09/08/1973    Years since quitting: 50.1   Smokeless tobacco: Never  Vaping Use   Vaping status: Never Used  Substance and Sexual Activity   Alcohol use: No   Drug use: No   Sexual activity: Not on file  Other Topics Concern   Not on file  Social History Narrative   Not on file   Social Drivers of Health   Financial Resource Strain: Not on file  Food Insecurity: Not on file  Transportation Needs: Not on file  Physical Activity: Not on file  Stress: Not on file  Social Connections: Not on file    MEDICATIONS:  Current Outpatient Medications  Medication Sig Dispense Refill   amLODipine (NORVASC) 5 MG tablet TAKE 1 TABLET BY MOUTH DAILY 100 tablet 2   aspirin EC 81 MG tablet Take 81 mg by mouth daily.     atorvastatin (LIPITOR) 20 MG tablet TAKE 1 TABLET BY MOUTH ONCE  DAILY 90 tablet 3   esomeprazole (NEXIUM) 40 MG capsule SMARTSIG:1 By Mouth     fexofenadine (ALLEGRA) 180 MG tablet Take 180 mg by mouth daily.     folic acid (FOLVITE) 400 MCG tablet Take 800  mcg by mouth daily.     gabapentin (NEURONTIN) 300 MG capsule Take 300 mg by mouth as directed.      levothyroxine (SYNTHROID) 137 MCG tablet TAKE 1 TABLET BY MOUTH DAILY  BEFORE BREAKFAST 100 tablet 2   losartan (COZAAR) 50 MG tablet TAKE 1 TABLET BY MOUTH DAILY 90 tablet 3   metFORMIN (GLUCOPHAGE-XR) 500 MG 24 hr tablet Take 3 tablets (  1,500 mg total) by mouth at bedtime. 300 tablet 2   metoprolol succinate (TOPROL-XL) 25 MG 24 hr tablet Take 1 tablet (25 mg total) by mouth daily. 30 tablet 3   metoprolol succinate (TOPROL-XL) 25 MG 24 hr tablet TAKE 1 TABLET BY MOUTH ONCE  DAILY 100 tablet 2   multivitamin (THERAGRAN) per tablet Take 1 tablet by mouth daily.     ONETOUCH VERIO test strip USE AS DIRECTED TO CHECK  BLOOD SUGAR ONCE DAILY 100 strip 2   traMADol (ULTRAM) 50 MG tablet Take 50 mg by mouth every 6 (six) hours as needed for severe pain (leg pain).     nitroGLYCERIN (NITROSTAT) 0.4 MG SL tablet Place 1 tablet (0.4 mg total) under the tongue every 5 (five) minutes as needed for chest pain. 25 tablet 3   No current facility-administered medications for this visit.    PHYSICAL EXAM: Vitals:   11/05/23 0809  BP: 118/72  Pulse: (!) 40  Resp: 20  SpO2: 98%  Weight: 146 lb 6.4 oz (66.4 kg)  Height: 5\' 8"  (1.727 m)   Body mass index is 22.26 kg/m.  Wt Readings from Last 3 Encounters:  11/05/23 146 lb 6.4 oz (66.4 kg)  07/17/23 146 lb 6.4 oz (66.4 kg)  05/05/23 143 lb 6.4 oz (65 kg)   HR by auscultation 70 bpm.  General: Well developed, well nourished male in no apparent distress.  HEENT: AT/[place], no external lesions.  Eyes: Conjunctiva clear and no icterus. Neck: Neck supple  Lungs: Respirations not labored Heart: S1 S2, RRR Neurologic: Alert, oriented, normal speech Extremities / Skin: Dry. No sores or rashes noted. DTR 2+ Psychiatric: Does not appear depressed or anxious  Diabetic Foot Exam: Monofilament sensory exam intact / decreased b/l, no callus, no ulceration,  Dorsalis Pedis 2+ b/l  Diabetic Foot Exam - Simple   Simple Foot Form Visual Inspection No deformities, no ulcerations, no other skin breakdown bilaterally: Yes See comments: Yes Sensation Testing Intact to touch and monofilament testing bilaterally: Yes Pulse Check Posterior Tibialis and Dorsalis pulse intact bilaterally: Yes Comments DP palpable bilaterally. Mild hammer toes deformities on left.     LABS Reviewed Lab Results  Component Value Date   HGBA1C 5.4 11/05/2023   HGBA1C 5.6 05/05/2023   HGBA1C 5.7 (A) 11/07/2022   No results found for: "FRUCTOSAMINE" Lab Results  Component Value Date   CHOL 110 05/06/2022   HDL 44.60 05/06/2022   LDLCALC 40 05/06/2022   TRIG 124.0 05/06/2022   CHOLHDL 2 05/06/2022   Lab Results  Component Value Date   MICRALBCREAT 2.9 05/05/2023   MICRALBCREAT 1.6 05/06/2022   Lab Results  Component Value Date   CREATININE 1.36 12/24/2022   Lab Results  Component Value Date   GFR 50.34 (L) 12/24/2022    ASSESSMENT / PLAN  1. Diabetes mellitus type 2 in nonobese (HCC)   2. Hypothyroidism, acquired, autoimmune   3. Mixed hyperlipidemia     Diabetes Mellitus type 2, complicated by CKD. - Diabetic status / severity: Controlled.  Lab Results  Component Value Date   HGBA1C 5.4 11/05/2023    - Hemoglobin A1c goal : <6.5%  - Medications: No change.  I) metformin extended release 1500 mg at bedtime /after supper.  - Home glucose testing: Few times a week. - Discussed/ Gave Hypoglycemia treatment plan.  # Consult : not required at this time.   # Annual urine for microalbuminuria/ creatinine ratio, no microalbuminuria currently, continue ACE/ARB /losartan. Last  Lab  Results  Component Value Date   MICRALBCREAT 2.9 05/05/2023   -Will check BMP with EGFR today.  # Foot check nightly.  # Annual dilated diabetic eye exams.   - Diet: Make healthy diabetic food choices  2. Blood pressure  -  BP Readings from Last 1  Encounters:  11/05/23 118/72    - Control is in target.  - No change in current plans.  3. Lipid status / Hyperlipidemia - Last  Lab Results  Component Value Date   LDLCALC 40 05/06/2022   - Continue atorvastatin 20 mg daily.  Managed by cardiology.  Will check lipid panel today.  He is due for it.  # Primary hypothyroidism : -Currently taking levothyroxine 137 mcg daily.  He has no hypo and hyperthyroid symptoms. -Will check thyroid function test today.  He has been taking multivitamin.  No additional biotin.  Diagnoses and all orders for this visit:  Diabetes mellitus type 2 in nonobese (HCC) -     POCT glycosylated hemoglobin (Hb A1C) -     BASIC METABOLIC PANEL WITH GFR  Hypothyroidism, acquired, autoimmune -     T4, free -     TSH  Mixed hyperlipidemia -     Lipid panel    DISPOSITION Follow up in clinic in 6 months suggested.   All questions answered and patient verbalized understanding of the plan.  [place] Correy Weidner, MD [facility] Endocrin[facility] [address] [place], [address] [place], [place] [address] Phone # [phone]  At least part of this note was generated using voice recognition software. Inadvertent word errors may have occurred, which were not recognized during the proofreading process.

## 2023-11-06 ENCOUNTER — Encounter: Payer: Self-pay | Admitting: Endocrinology

## 2023-11-06 LAB — BASIC METABOLIC PANEL WITH GFR
BUN/Creatinine Ratio: 14 (calc) (ref 6–22)
BUN: 19 mg/dL (ref 7–25)
CO2: 26 mmol/L (ref 20–32)
Calcium: 9.7 mg/dL (ref 8.6–10.3)
Chloride: 98 mmol/L (ref 98–110)
Creat: 1.35 mg/dL — ABNORMAL HIGH (ref 0.70–1.28)
Glucose, Bld: 82 mg/dL (ref 65–99)
Potassium: 5.3 mmol/L (ref 3.5–5.3)
Sodium: 132 mmol/L — ABNORMAL LOW (ref 135–146)
eGFR: 54 mL/min/{1.73_m2} — ABNORMAL LOW (ref >=60)

## 2023-11-06 LAB — LIPID PANEL
Cholesterol: 113 mg/dL (ref <200)
HDL: 47 mg/dL (ref >=40)
LDL Cholesterol (Calc): 48 mg/dL
Non-HDL Cholesterol (Calc): 66 mg/dL (ref <130)
Total CHOL/HDL Ratio: 2.4 (calc) (ref <5.0)
Triglycerides: 99 mg/dL (ref <150)

## 2023-11-06 LAB — T4, FREE: Free T4: 1.7 ng/dL (ref 0.8–1.8)

## 2023-11-06 LAB — TSH: TSH: 0.79 m[IU]/L (ref 0.40–4.50)

## 2023-11-06 NOTE — Telephone Encounter (Signed)
 Noted.

## 2024-02-10 DIAGNOSIS — E119 Type 2 diabetes mellitus without complications: Secondary | ICD-10-CM | POA: Diagnosis not present

## 2024-02-10 DIAGNOSIS — H52203 Unspecified astigmatism, bilateral: Secondary | ICD-10-CM | POA: Diagnosis not present

## 2024-02-10 DIAGNOSIS — Z961 Presence of intraocular lens: Secondary | ICD-10-CM | POA: Diagnosis not present

## 2024-02-10 LAB — HM DIABETES EYE EXAM

## 2024-02-11 ENCOUNTER — Ambulatory Visit: Payer: Self-pay | Admitting: Endocrinology

## 2024-03-08 DIAGNOSIS — M25562 Pain in left knee: Secondary | ICD-10-CM | POA: Diagnosis not present

## 2024-03-08 DIAGNOSIS — M4326 Fusion of spine, lumbar region: Secondary | ICD-10-CM | POA: Diagnosis not present

## 2024-03-08 DIAGNOSIS — M48062 Spinal stenosis, lumbar region with neurogenic claudication: Secondary | ICD-10-CM | POA: Diagnosis not present

## 2024-03-09 ENCOUNTER — Encounter: Payer: Self-pay | Admitting: Endocrinology

## 2024-03-17 DIAGNOSIS — E1169 Type 2 diabetes mellitus with other specified complication: Secondary | ICD-10-CM | POA: Diagnosis not present

## 2024-03-17 DIAGNOSIS — Z0000 Encounter for general adult medical examination without abnormal findings: Secondary | ICD-10-CM | POA: Diagnosis not present

## 2024-03-17 DIAGNOSIS — M545 Low back pain, unspecified: Secondary | ICD-10-CM | POA: Diagnosis not present

## 2024-03-17 DIAGNOSIS — D649 Anemia, unspecified: Secondary | ICD-10-CM | POA: Diagnosis not present

## 2024-03-17 DIAGNOSIS — K625 Hemorrhage of anus and rectum: Secondary | ICD-10-CM | POA: Diagnosis not present

## 2024-03-17 DIAGNOSIS — I10 Essential (primary) hypertension: Secondary | ICD-10-CM | POA: Diagnosis not present

## 2024-03-17 DIAGNOSIS — N1831 Chronic kidney disease, stage 3a: Secondary | ICD-10-CM | POA: Diagnosis not present

## 2024-03-17 DIAGNOSIS — J309 Allergic rhinitis, unspecified: Secondary | ICD-10-CM | POA: Diagnosis not present

## 2024-03-17 DIAGNOSIS — I25709 Atherosclerosis of coronary artery bypass graft(s), unspecified, with unspecified angina pectoris: Secondary | ICD-10-CM | POA: Diagnosis not present

## 2024-03-17 DIAGNOSIS — K219 Gastro-esophageal reflux disease without esophagitis: Secondary | ICD-10-CM | POA: Diagnosis not present

## 2024-03-17 DIAGNOSIS — E039 Hypothyroidism, unspecified: Secondary | ICD-10-CM | POA: Diagnosis not present

## 2024-03-21 ENCOUNTER — Encounter: Payer: Self-pay | Admitting: Endocrinology

## 2024-03-23 DIAGNOSIS — R3915 Urgency of urination: Secondary | ICD-10-CM | POA: Diagnosis not present

## 2024-03-24 ENCOUNTER — Telehealth: Payer: Self-pay | Admitting: Cardiovascular Disease

## 2024-03-24 NOTE — Telephone Encounter (Signed)
 Patients want to know can they get a handi cap placard form filled out by doctor cooper

## 2024-03-30 NOTE — Telephone Encounter (Signed)
 Called and spoke with wife who states that it's his scoliosis and arthritis that limits his walking ability. Advised her that it would need to come from PCP since from a cardiac standpoint, he's completely healthy and asymptomatic. Wife was very nice and appreciative of call-states she wasn't sure where to start.

## 2024-03-31 DIAGNOSIS — L570 Actinic keratosis: Secondary | ICD-10-CM | POA: Diagnosis not present

## 2024-03-31 DIAGNOSIS — L821 Other seborrheic keratosis: Secondary | ICD-10-CM | POA: Diagnosis not present

## 2024-03-31 DIAGNOSIS — Z85828 Personal history of other malignant neoplasm of skin: Secondary | ICD-10-CM | POA: Diagnosis not present

## 2024-03-31 DIAGNOSIS — L720 Epidermal cyst: Secondary | ICD-10-CM | POA: Diagnosis not present

## 2024-03-31 DIAGNOSIS — L814 Other melanin hyperpigmentation: Secondary | ICD-10-CM | POA: Diagnosis not present

## 2024-03-31 DIAGNOSIS — D692 Other nonthrombocytopenic purpura: Secondary | ICD-10-CM | POA: Diagnosis not present

## 2024-04-13 ENCOUNTER — Other Ambulatory Visit: Payer: Self-pay | Admitting: Cardiovascular Disease

## 2024-04-13 ENCOUNTER — Encounter: Payer: Self-pay | Admitting: Endocrinology

## 2024-04-13 ENCOUNTER — Other Ambulatory Visit: Payer: Self-pay | Admitting: Endocrinology

## 2024-04-13 DIAGNOSIS — E119 Type 2 diabetes mellitus without complications: Secondary | ICD-10-CM

## 2024-04-14 ENCOUNTER — Encounter: Payer: Self-pay | Admitting: Endocrinology

## 2024-04-14 ENCOUNTER — Other Ambulatory Visit: Payer: Self-pay

## 2024-04-14 DIAGNOSIS — E119 Type 2 diabetes mellitus without complications: Secondary | ICD-10-CM

## 2024-04-14 DIAGNOSIS — E063 Autoimmune thyroiditis: Secondary | ICD-10-CM

## 2024-04-14 MED ORDER — ACCU-CHEK GUIDE W/DEVICE KIT: PACK | 0 refills | Status: DC

## 2024-04-14 MED ORDER — ACCU-CHEK GUIDE W/DEVICE KIT: PACK | 0 refills | Status: AC

## 2024-04-14 MED ORDER — ACCU-CHEK SOFTCLIX LANCETS MISC: 12 refills | Status: AC

## 2024-04-14 MED ORDER — ACCU-CHEK GUIDE TEST VI STRP: ORAL_STRIP | 12 refills | Status: DC

## 2024-04-14 MED ORDER — ACCU-CHEK GUIDE TEST VI STRP: ORAL_STRIP | 12 refills | Status: AC

## 2024-04-14 MED ORDER — ACCU-CHEK SOFTCLIX LANCETS MISC: 12 refills | Status: DC

## 2024-04-15 NOTE — Telephone Encounter (Signed)
 He needs labs for BMP with EGFR, thyroid  function test, urine microalbumin creatinine ratio.  I have placed orders.

## 2024-04-20 ENCOUNTER — Other Ambulatory Visit

## 2024-04-20 DIAGNOSIS — E063 Autoimmune thyroiditis: Secondary | ICD-10-CM | POA: Diagnosis not present

## 2024-04-20 DIAGNOSIS — E119 Type 2 diabetes mellitus without complications: Secondary | ICD-10-CM | POA: Diagnosis not present

## 2024-04-20 LAB — LAB REPORT - SCANNED
A1c: 6
Creatinine, POC: 32 mg/dL
Creatinine, POC: <32 mg/dL
EGFR: 55
Free T4: 1.7 ng/dL
TSH: 0.6 (ref 0.41–5.90)

## 2024-04-21 ENCOUNTER — Encounter: Payer: Self-pay | Admitting: Endocrinology

## 2024-04-21 ENCOUNTER — Ambulatory Visit: Payer: Self-pay | Admitting: Endocrinology

## 2024-04-21 LAB — BASIC METABOLIC PANEL WITH GFR
BUN/Creatinine Ratio: 9 (calc) (ref 6–22)
BUN: 12 mg/dL (ref 7–25)
CO2: 27 mmol/L (ref 20–32)
Calcium: 9.3 mg/dL (ref 8.6–10.3)
Chloride: 99 mmol/L (ref 98–110)
Creat: 1.32 mg/dL — ABNORMAL HIGH (ref 0.70–1.28)
Glucose, Bld: 81 mg/dL (ref 65–99)
Potassium: 5.3 mmol/L (ref 3.5–5.3)
Sodium: 133 mmol/L — ABNORMAL LOW (ref 135–146)
eGFR: 55 mL/min/1.73m2 — ABNORMAL LOW (ref >=60)

## 2024-04-21 LAB — T4, FREE: Free T4: 1.7 ng/dL (ref 0.8–1.8)

## 2024-04-21 LAB — HEMOGLOBIN A1C
Hgb A1c MFr Bld: 6 % — ABNORMAL HIGH (ref <5.7)
Mean Plasma Glucose: 126 mg/dL
eAG (mmol/L): 7 mmol/L

## 2024-04-21 LAB — MICROALBUMIN / CREATININE URINE RATIO
Creatinine, Urine: 32 mg/dL (ref 20–320)
Microalb, Ur: <0.2 mg/dL

## 2024-04-21 LAB — TSH: TSH: 0.6 m[IU]/L (ref 0.40–4.50)

## 2024-04-22 ENCOUNTER — Ambulatory Visit: Payer: Self-pay | Admitting: Endocrinology

## 2024-05-04 ENCOUNTER — Ambulatory Visit: Payer: Medicare Other | Admitting: Endocrinology

## 2024-05-04 ENCOUNTER — Encounter: Payer: Self-pay | Admitting: Endocrinology

## 2024-05-04 VITALS — BP 100/70 | HR 70 | Resp 20 | Ht 68.0 in | Wt 146.4 lb

## 2024-05-04 DIAGNOSIS — E782 Mixed hyperlipidemia: Secondary | ICD-10-CM

## 2024-05-04 DIAGNOSIS — E119 Type 2 diabetes mellitus without complications: Secondary | ICD-10-CM

## 2024-05-04 DIAGNOSIS — Z7984 Long term (current) use of oral hypoglycemic drugs: Secondary | ICD-10-CM

## 2024-05-04 DIAGNOSIS — E063 Autoimmune thyroiditis: Secondary | ICD-10-CM

## 2024-05-04 NOTE — Progress Notes (Signed)
 Outpatient Endocrinology Note [place] Earnie Rockhold, MD   Patient's Name: [name] [name]    DOB: March 30, 1946    MRN: [id number]                                                    REASON OF VISIT: Follow up for type 2 diabetes mellitus and hypothyroidism.  PCP: Leonel Cole, MD  HISTORY OF PRESENT ILLNESS:   [name] [name] is a 78 y.o. old male with past medical history listed below, is here for follow up for type 2 diabetes mellitus and hypothyroidism.   Pertinent Diabetes History: Patient was previously seen by Dr. Von and was last time seen in August 2024. Patient was diagnosed with type 2 diabetes mellitus in 2009.  He has controlled type 2 diabetes mellitus on metformin  monotherapy.  Chronic Diabetes Complications : Retinopathy: no. Last ophthalmology exam was done on annually 02/2024, following with ophthalmology regularly.  Nephropathy:CKD IIIa, on ACE/ARB /losartan . Peripheral neuropathy: no Coronary artery disease: on Stroke: no  Relevant comorbidities and cardiovascular risk factors: Obesity: no Body mass index is 22.26 kg/m.  Hypertension: Yes  Hyperlipidemia : Yes, on statin   Current / Home Diabetic regimen includes:  Metformin  extended release 1500 mg at bedtime.  Prior diabetic medications: None  Glycemic data:    He has One Touch Verio glucometer, checking blood sugar occasionally.  No glucose record in the last 2 weeks.   Hypoglycemia: Patient has no hypoglycemic episodes. Patient has hypoglycemia awareness.  Factors modifying glucose control: 1.  Diabetic diet assessment: 3 meals a day.  Occasionally ice cream.  Watermelon in the summer months.  2.  Staying active or exercising: Not able to exercise due to back pain.  3.  Medication compliance: compliant all of the time.  # Primary hypothyroidism : -Patient has longstanding hypothyroidism primary.  Levothyroxine  dose was increased from 125 to 137 mcg daily in April 2019.  Lately he has been on a stable dose  of levothyroxine  137 mcg daily.   Interval history  Patient laboratory results reviewed.  Hemoglobin A1c slightly of 6%.  Tolerating metformin  well, no GI issues.  He has been checking blood sugars daily and reports has always been normal.  In the summer months he usually eats watermelon.  He also has been eating ice cream lately.  No numbness and tingling of the feet.  Other laboratory results reviewed.  Stable renal function.  Stable serum sodium slightly low.  Thyroid  function test normal.  Urine microalbumin creatinine ratio normal.  He had diabetic eye exam in June and was normal.  He has been taking levothyroxine  137 mcg daily.  Denies palpitation and heat intolerance.  No change in bowel habit.  No cold intolerance.  No other complaints today.  He stays quite busy at home.  REVIEW OF SYSTEMS As per history of present illness.   PAST MEDICAL HISTORY: Past Medical History:  Diagnosis Date   Allergy    AMI (acute mesenteric ischemia)    Arthritis, lumbar spine    Cataract    Colon polyps    Coronary artery disease 01/24/2011   S/p CABG // Myoview  07/2019: EF 51 (visually appears 55-60), no ischemia or infarction, Low Risk   Diabetes mellitus without complication (HCC)    Diverticulosis    GERD (gastroesophageal reflux disease)    Heart  attack [facility]) 2004   Hemorrhoids    HTN (hypertension)    Hypothyroidism    Inguinal hernia    Lumbar scoliosis    Rectal bleeding     PAST SURGICAL HISTORY: Past Surgical History:  Procedure Laterality Date   APPENDECTOMY  1966   by-pass     CATARACT EXTRACTION     Bil  /   11/2012   COLONOSCOPY     CORONARY ANGIOPLASTY WITH STENT PLACEMENT     CORONARY ARTERY BYPASS GRAFT  2004   x 5 / one stent   FEMUR IM NAIL Left 05/25/2014   Procedure: INTRAMEDULLARY (IM) NAIL FEMORAL WITH CABLES;  Surgeon: Cordella Glendia Hutchinson, MD;  Location: [facility] OR;  Service: Orthopedics;  Laterality: Left;   INGUINAL HERNIA REPAIR  1994   right side    lumbar laminotomy  10/2009   POLYPECTOMY      ALLERGIES: Allergies  Allergen Reactions   Sulfamethoxazole-Trimethoprim Other (See Comments)    Extreme discomfort   Sulfa Antibiotics Rash   Niaspan  [Niacin  Er (Antihyperlipidemic)] Hives    FAMILY HISTORY:  Family History  Problem Relation Age of Onset   Diabetes Mother    Heart disease Father    Colon cancer Cousin     SOCIAL HISTORY: Social History   Socioeconomic History   Marital status: Married    Spouse name: Not on file   Number of children: Not on file   Years of education: Not on file   Highest education level: Not on file  Occupational History   Not on file  Tobacco Use   Smoking status: Former    Current packs/day: 0.00    Average packs/day: 1 pack/day for 10.0 years (10.0 ttl pk-yrs)    Types: Cigarettes    Start date: 09/09/1963    Quit date: 09/08/1973    Years since quitting: 50.6   Smokeless tobacco: Never  Vaping Use   Vaping status: Never Used  Substance and Sexual Activity   Alcohol use: No   Drug use: No   Sexual activity: Not on file  Other Topics Concern   Not on file  Social History Narrative   Not on file   Social Drivers of Health   Financial Resource Strain: Low Risk  (03/08/2024)   Received from [facility]   Overall Financial Resource Strain (CARDIA)    Difficulty of Paying Living Expenses: Not hard at all  Food Insecurity: No Food Insecurity (03/08/2024)   Received from [facility]   Hunger Vital Sign    Within the past 12 months, you worried that your food would run out before you got the money to buy more.: Never true    Within the past 12 months, the food you bought just didn't last and you didn't have money to get more.: Never true  Transportation Needs: No Transportation Needs (03/08/2024)   Received from [facility] - Transportation    Lack of Transportation (Medical): No    Lack of Transportation (Non-Medical): No  Physical Activity: Not on file  Stress:  Not on file  Social Connections: Not on file    MEDICATIONS:  Current Outpatient Medications  Medication Sig Dispense Refill   Accu-Chek Softclix Lancets lancets Use to prick finger to check blood glucose levels as directed 100 each 12   amLODipine  (NORVASC ) 5 MG tablet TAKE 1 TABLET BY MOUTH DAILY 100 tablet 2   aspirin  EC 81 MG tablet Take 81 mg by mouth daily.  atorvastatin  (LIPITOR) 20 MG tablet TAKE 1 TABLET BY MOUTH ONCE  DAILY 90 tablet 3   Blood Glucose Monitoring Suppl (ACCU-CHEK GUIDE) w/Device KIT Use as advised to check blood glucose levels 1 kit 0   esomeprazole  (NEXIUM ) 40 MG capsule SMARTSIG:1 By Mouth     fexofenadine (ALLEGRA) 180 MG tablet Take 180 mg by mouth daily.     folic acid  (FOLVITE ) 400 MCG tablet Take 800 mcg by mouth daily.     gabapentin (NEURONTIN) 300 MG capsule Take 300 mg by mouth as directed.      glucose blood (ACCU-CHEK GUIDE TEST) test strip Use to test blood glucose before meals and at bedtime 200 each 12   levothyroxine  (SYNTHROID ) 137 MCG tablet TAKE 1 TABLET BY MOUTH DAILY  BEFORE BREAKFAST 100 tablet 2   losartan  (COZAAR ) 50 MG tablet TAKE 1 TABLET BY MOUTH DAILY 90 tablet 3   metFORMIN  (GLUCOPHAGE -XR) 500 MG 24 hr tablet Take 3 tablets (1,500 mg total) by mouth at bedtime. 300 tablet 2   metoprolol  succinate (TOPROL -XL) 25 MG 24 hr tablet Take 1 tablet (25 mg total) by mouth daily. 30 tablet 3   metoprolol  succinate (TOPROL -XL) 25 MG 24 hr tablet TAKE 1 TABLET BY MOUTH ONCE  DAILY 100 tablet 2   multivitamin (THERAGRAN) per tablet Take 1 tablet by mouth daily.     nitroGLYCERIN  (NITROSTAT ) 0.4 MG SL tablet Place 1 tablet (0.4 mg total) under the tongue every 5 (five) minutes as needed for chest pain. 25 tablet 3   traMADol  (ULTRAM ) 50 MG tablet Take 50 mg by mouth every 6 (six) hours as needed for severe pain (leg pain).     No current facility-administered medications for this visit.    PHYSICAL EXAM: Vitals:   05/04/24 0811  BP: 100/70   Pulse: 70  Resp: 20  SpO2: 96%  Weight: 146 lb 6.4 oz (66.4 kg)  Height: 5' 8 (1.727 m)    Body mass index is 22.26 kg/m.  Wt Readings from Last 3 Encounters:  05/04/24 146 lb 6.4 oz (66.4 kg)  11/05/23 146 lb 6.4 oz (66.4 kg)  07/17/23 146 lb 6.4 oz (66.4 kg)   HR by auscultation 70 bpm.  General: Well developed, well nourished male in no apparent distress.  HEENT: AT/[place], no external lesions.  Eyes: Conjunctiva clear and no icterus. Neck: Neck supple  Lungs: Respirations not labored Heart: S1 S2, RRR Neurologic: Alert, oriented, normal speech Extremities / Skin: Dry.  Psychiatric: Does not appear depressed or anxious  Diabetic Foot Exam - Simple   Simple Foot Form Diabetic Foot exam was performed with the following findings: Yes 05/04/2024  8:29 AM  Visual Inspection No deformities, no ulcerations, no other skin breakdown bilaterally: Yes Sensation Testing Intact to touch and monofilament testing bilaterally: Yes Pulse Check Posterior Tibialis and Dorsalis pulse intact bilaterally: Yes Comments    LABS Reviewed Lab Results  Component Value Date   HGBA1C 6.0 (H) 04/20/2024   HGBA1C 5.4 11/05/2023   HGBA1C 5.6 05/05/2023   No results found for: FRUCTOSAMINE Lab Results  Component Value Date   CHOL 113 11/05/2023   HDL 47 11/05/2023   LDLCALC 48 11/05/2023   TRIG 99 11/05/2023   CHOLHDL 2.4 11/05/2023   Lab Results  Component Value Date   MICRALBCREAT NOTE 04/20/2024   MICRALBCREAT  04/20/2024     Comment:     NOTE - Abstracted by HIM   Lab Results  Component Value Date   CREATININE  1.32 (H) 04/20/2024   Lab Results  Component Value Date   GFR 50.34 (L) 12/24/2022    ASSESSMENT / PLAN  1. Diabetes mellitus type 2 in nonobese (HCC)   2. Hypothyroidism, acquired, autoimmune   3. Mixed hyperlipidemia      Diabetes Mellitus type 2, complicated by CKD. - Diabetic status / severity: Controlled.  Lab Results  Component Value Date   HGBA1C  6.0 (H) 04/20/2024    - Hemoglobin A1c goal : <6.5%  Overall reasonable control.  Discussed regarding limiting ice cream and limiting sugary foods for example watermelon to avoid postprandial hyperglycemia.  - Medications: No change.  I) metformin  extended release 1500 mg at bedtime / after supper.  - Home glucose testing: Few times a week. - Discussed/ Gave Hypoglycemia treatment plan.  # Consult : not required at this time.   # Annual urine for microalbuminuria/ creatinine ratio, no microalbuminuria currently, continue ACE/ARB /losartan . Last  Lab Results  Component Value Date   MICRALBCREAT NOTE 04/20/2024    # Foot check nightly.  # Annual dilated diabetic eye exams.   - Diet: Make healthy diabetic food choices  2. Blood pressure  -  BP Readings from Last 1 Encounters:  05/04/24 100/70    - Control is in target.  - No change in current plans.  3. Lipid status / Hyperlipidemia - Last  Lab Results  Component Value Date   LDLCALC 48 11/05/2023   - Continue atorvastatin  20 mg daily.    # Primary hypothyroidism : -Currently taking levothyroxine  137 mcg daily.  Recent normal thyroid  function test.  Diagnoses and all orders for this visit:  Diabetes mellitus type 2 in nonobese (HCC) -     Basic metabolic panel with GFR -     Lipid panel -     Hemoglobin A1c  Hypothyroidism, acquired, autoimmune -     T4, free -     TSH  Mixed hyperlipidemia -     Lipid panel   Endocrinology follow-up every 6 months.  DISPOSITION Follow up in clinic in 6 months suggested.  Labs prior to follow-up visit as ordered.   All questions answered and patient verbalized understanding of the plan.  [place] Zoiee Wimmer, MD [facility] Endocrinology [facility] [address] [place], [address] [place], [place] [address] Phone # [phone]  At least part of this note was generated using voice recognition software. Inadvertent word errors may have occurred, which were not  recognized during the proofreading process.

## 2024-05-22 ENCOUNTER — Other Ambulatory Visit: Payer: Self-pay | Admitting: Endocrinology

## 2024-05-22 DIAGNOSIS — E119 Type 2 diabetes mellitus without complications: Secondary | ICD-10-CM

## 2024-06-22 ENCOUNTER — Other Ambulatory Visit: Payer: Self-pay | Admitting: Cardiovascular Disease

## 2024-06-22 DIAGNOSIS — I10 Essential (primary) hypertension: Secondary | ICD-10-CM

## 2024-06-25 DIAGNOSIS — Z23 Encounter for immunization: Secondary | ICD-10-CM | POA: Diagnosis not present

## 2024-07-27 ENCOUNTER — Ambulatory Visit: Attending: Cardiovascular Disease | Admitting: Cardiovascular Disease

## 2024-07-27 ENCOUNTER — Encounter: Payer: Self-pay | Admitting: Cardiovascular Disease

## 2024-07-27 VITALS — BP 106/70 | HR 79 | Ht 68.0 in | Wt 139.2 lb

## 2024-07-27 DIAGNOSIS — E782 Mixed hyperlipidemia: Secondary | ICD-10-CM

## 2024-07-27 DIAGNOSIS — I2510 Atherosclerotic heart disease of native coronary artery without angina pectoris: Secondary | ICD-10-CM | POA: Diagnosis not present

## 2024-07-27 DIAGNOSIS — I10 Essential (primary) hypertension: Secondary | ICD-10-CM

## 2024-07-27 NOTE — Assessment & Plan Note (Signed)
 Continue aspirin  for antiplatelet therapy.  Continue statin drug.  Patient doing really well now greater than 20 years out from multivessel CABG.

## 2024-07-27 NOTE — Progress Notes (Signed)
 Cardiology Office Note:    Date:  07/27/2024   ID:  [name] [name], [name] [name] 05-May-1946, MRN [id number]  PCP:  Leonel Cole, MD   [facility] Providers Cardiologist:  Ozell Fell, MD     Referring MD: Leonel Cole, MD   Chief Complaint  Patient presents with   Coronary Artery Disease    History of Present Illness:    [name] [name] is a 78 y.o. male with a hx of:  Coronary artery disease  S/p CABG 2004 LIMA sequential to diagonal and LAD Left radial to first OM SVG to distal circumflex RIMA to PDA Atrial bigeminy  Hyperlipidemia  Hypertension  Diabetes mellitus 2 GERD Hypothyroidism  The patient has been most limited by arthritis in the past. He is doing really well. Here alone today. Reports no change in his medications. Today, he denies symptoms of palpitations, chest pain, shortness of breath, orthopnea, PND, lower extremity edema, dizziness, or syncope.   Current Medications: Current Meds  Medication Sig   Accu-Chek Softclix Lancets lancets Use to prick finger to check blood glucose levels as directed   amLODipine  (NORVASC ) 5 MG tablet TAKE 1 TABLET BY MOUTH DAILY   aspirin  EC 81 MG tablet Take 81 mg by mouth daily.   atorvastatin  (LIPITOR) 20 MG tablet TAKE 1 TABLET BY MOUTH ONCE  DAILY   Blood Glucose Monitoring Suppl (ACCU-CHEK GUIDE) w/Device KIT Use as advised to check blood glucose levels   esomeprazole  (NEXIUM ) 40 MG capsule SMARTSIG:1 By Mouth   fexofenadine (ALLEGRA) 180 MG tablet Take 180 mg by mouth daily.   folic acid  (FOLVITE ) 400 MCG tablet Take 800 mcg by mouth daily.   gabapentin (NEURONTIN) 300 MG capsule Take 300 mg by mouth as directed.    glucose blood (ACCU-CHEK GUIDE TEST) test strip Use to test blood glucose before meals and at bedtime   levothyroxine  (SYNTHROID ) 137 MCG tablet TAKE 1 TABLET BY MOUTH DAILY  BEFORE BREAKFAST   losartan  (COZAAR ) 50 MG tablet TAKE 1 TABLET BY MOUTH DAILY   metFORMIN  (GLUCOPHAGE -XR) 500 MG 24 hr tablet  TAKE 3 TABLETS BY MOUTH AT  BEDTIME   metoprolol  succinate (TOPROL -XL) 25 MG 24 hr tablet TAKE 1 TABLET BY MOUTH ONCE  DAILY   multivitamin (THERAGRAN) per tablet Take 1 tablet by mouth daily.   nitroGLYCERIN  (NITROSTAT ) 0.4 MG SL tablet Place 1 tablet (0.4 mg total) under the tongue every 5 (five) minutes as needed for chest pain.   traMADol  (ULTRAM ) 50 MG tablet Take 50 mg by mouth every 6 (six) hours as needed for severe pain (leg pain).     Allergies:   Sulfamethoxazole-trimethoprim, Sulfa antibiotics, and Niaspan  [niacin  er (antihyperlipidemic)]   ROS:   Please see the history of present illness.    All other systems reviewed and are negative.  EKGs/Labs/Other Studies Reviewed:    The following studies were reviewed today: Cardiac Studies & Procedures   ______________________________________________________________________________________________   STRESS TESTS  MYOCARDIAL PERFUSION IMAGING 07/12/2019  Interpretation Summary  Nuclear stress EF: 51%. Visually, the EF appears to be be greater than the 51% calculated by the computer. The EF appears to be 55-60% visually.  There was no ST segment deviation noted during stress.  This is a low risk study. There is no evidence of ischemia or previous infarction.  The study is normal.      MONITORS  CARDIAC EVENT MONITOR 08/12/2017  Narrative Sinus rhythm with single PVC's present       ______________________________________________________________________________________________  EKG:   EKG Interpretation Date/Time:  Wednesday July 27 2024 14:08:50 EST Ventricular Rate:  79 PR Interval:  130 QRS Duration:  76 QT Interval:  346 QTC Calculation: 396 R Axis:   80  Text Interpretation: Sinus rhythm with Premature atrial complexes When compared with ECG of 17-Jul-2023 08:30, No significant change was found Confirmed by Wonda Sharper [phone]) on 07/27/2024 2:22:00 PM    Recent Labs: 04/20/2024: BUN 12;  Creat 1.32; Potassium 5.3; Sodium 133; TSH 0.60  Recent Lipid Panel    Component Value Date/Time   CHOL 113 11/05/2023 0844   TRIG 99 11/05/2023 0844   HDL 47 11/05/2023 0844   CHOLHDL 2.4 11/05/2023 0844   VLDL 24.8 05/06/2022 0833   LDLCALC 48 11/05/2023 0844     Risk Assessment/Calculations:                Physical Exam:    VS:  BP 106/70 (BP Location: Right Arm, Patient Position: Sitting, Cuff Size: Normal)   Pulse 79   Ht 5' 8 (1.727 m)   Wt 139 lb 3.2 oz (63.1 kg)   SpO2 97%   BMI 21.17 kg/m     Wt Readings from Last 3 Encounters:  07/27/24 139 lb 3.2 oz (63.1 kg)  05/04/24 146 lb 6.4 oz (66.4 kg)  11/05/23 146 lb 6.4 oz (66.4 kg)     GEN:  Well nourished, well developed in no acute distress HEENT: Normal NECK: No JVD; No carotid bruits LYMPHATICS: No lymphadenopathy CARDIAC: RRR, no murmurs, rubs, gallops RESPIRATORY:  Clear to auscultation without rales, wheezing or rhonchi  ABDOMEN: Soft, non-tender, non-distended MUSCULOSKELETAL:  No edema; No deformity  SKIN: Warm and dry NEUROLOGIC:  Alert and oriented x 3 PSYCHIATRIC:  Normal affect   Assessment & Plan HYPERTENSION Blood pressure remains under ideal control on a combination of amlodipine , losartan , metoprolol  succinate, and ramipril .  We talked about whether we should discontinue one of his medicine since his blood pressure is running relatively low for his age.  With no symptoms of lightheadedness or weakness, we decided to keep him on his current regimen as he seems to be tolerating it very well. Coronary artery disease involving native coronary artery of native heart without angina pectoris Continue aspirin  for antiplatelet therapy.  Continue statin drug.  Patient doing really well now greater than 20 years out from multivessel CABG. Mixed hyperlipidemia Continue atorvastatin .  LDL cholesterol is 48.            Medication Adjustments/Labs and Tests Ordered: Current medicines are reviewed  at length with the patient today.  Concerns regarding medicines are outlined above.  Orders Placed This Encounter  Procedures   EKG 12-Lead   No orders of the defined types were placed in this encounter.   Patient Instructions  Medication Instructions:  No medication changes were made at this visit. Continue current regimen.   *If you need a refill on your cardiac medications before your next appointment, please call your pharmacy*  Lab Work: None ordered today. If you have labs (blood work) drawn today and your tests are completely normal, you will receive your results only by: MyChart Message (if you have MyChart) OR A paper copy in the mail If you have any lab test that is abnormal or we need to change your treatment, we will call you to review the results.  Testing/Procedures: None ordered today.  Follow-Up: At [facility], you and your health needs are our priority.  As part of  our continuing mission to provide you with exceptional heart care, our providers are all part of one team.  This team includes your primary Cardiologist (physician) and Advanced Practice Providers or APPs (Physician Assistants and Nurse Practitioners) who all work together to provide you with the care you need, when you need it.  Your next appointment:   1 year(s)  Provider:   Ozell Fell, MD      Signed, Ozell Fell, MD  07/27/2024 2:39 PM    [facility]

## 2024-07-27 NOTE — Assessment & Plan Note (Signed)
 Continue atorvastatin .  LDL cholesterol is 48.

## 2024-07-27 NOTE — Assessment & Plan Note (Signed)
 Blood pressure remains under ideal control on a combination of amlodipine , losartan , metoprolol  succinate, and ramipril .  We talked about whether we should discontinue one of his medicine since his blood pressure is running relatively low for his age.  With no symptoms of lightheadedness or weakness, we decided to keep him on his current regimen as he seems to be tolerating it very well.

## 2024-07-27 NOTE — Patient Instructions (Signed)

## 2024-09-26 ENCOUNTER — Other Ambulatory Visit: Payer: Self-pay | Admitting: Cardiovascular Disease

## 2024-09-26 DIAGNOSIS — I10 Essential (primary) hypertension: Secondary | ICD-10-CM

## 2024-10-04 NOTE — Telephone Encounter (Signed)
 Lipids done on 11/05/23. BMP outside of Normal Range on 04/20/24

## 2024-11-03 ENCOUNTER — Other Ambulatory Visit

## 2024-11-07 ENCOUNTER — Ambulatory Visit: Admitting: Endocrinology
